# Patient Record
Sex: Female | Born: 1980 | Race: Black or African American | Hispanic: No | Marital: Married | Smoking: Never smoker
Health system: Southern US, Community
[De-identification: ages and names within clinical notes are randomized; demographics above are authoritative.]

## PROBLEM LIST (undated history)

## (undated) ENCOUNTER — Inpatient Hospital Stay (HOSPITAL_COMMUNITY): Payer: Self-pay

## (undated) DIAGNOSIS — S0300XA Dislocation of jaw, unspecified side, initial encounter: Secondary | ICD-10-CM

## (undated) DIAGNOSIS — D649 Anemia, unspecified: Secondary | ICD-10-CM

## (undated) DIAGNOSIS — R030 Elevated blood-pressure reading, without diagnosis of hypertension: Secondary | ICD-10-CM

## (undated) DIAGNOSIS — G510 Bell's palsy: Secondary | ICD-10-CM

## (undated) DIAGNOSIS — Z789 Other specified health status: Secondary | ICD-10-CM

## (undated) DIAGNOSIS — K219 Gastro-esophageal reflux disease without esophagitis: Secondary | ICD-10-CM

## (undated) DIAGNOSIS — D259 Leiomyoma of uterus, unspecified: Secondary | ICD-10-CM

## (undated) DIAGNOSIS — Z8742 Personal history of other diseases of the female genital tract: Secondary | ICD-10-CM

## (undated) DIAGNOSIS — Z973 Presence of spectacles and contact lenses: Secondary | ICD-10-CM

## (undated) DIAGNOSIS — R52 Pain, unspecified: Secondary | ICD-10-CM

## (undated) DIAGNOSIS — R011 Cardiac murmur, unspecified: Secondary | ICD-10-CM

## (undated) DIAGNOSIS — J309 Allergic rhinitis, unspecified: Secondary | ICD-10-CM

## (undated) HISTORY — DX: Dislocation of jaw, unspecified side, initial encounter: S03.00XA

## (undated) HISTORY — DX: Cardiac murmur, unspecified: R01.1

## (undated) HISTORY — DX: Gastro-esophageal reflux disease without esophagitis: K21.9

---
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
## 2000-06-20 ENCOUNTER — Emergency Department (HOSPITAL_COMMUNITY): Admission: EM | Admit: 2000-06-20 | Discharge: 2000-06-20 | Payer: Self-pay | Admitting: Emergency Medicine

## 2000-07-16 ENCOUNTER — Encounter: Admission: RE | Admit: 2000-07-16 | Discharge: 2000-07-16 | Payer: Self-pay | Admitting: Family Medicine

## 2000-09-30 ENCOUNTER — Ambulatory Visit (HOSPITAL_COMMUNITY): Admission: RE | Admit: 2000-09-30 | Discharge: 2000-09-30 | Payer: Self-pay | Admitting: Sports Medicine

## 2000-09-30 ENCOUNTER — Encounter: Admission: RE | Admit: 2000-09-30 | Discharge: 2000-09-30 | Payer: Self-pay | Admitting: Family Medicine

## 2001-07-07 ENCOUNTER — Encounter: Admission: RE | Admit: 2001-07-07 | Discharge: 2001-07-07 | Payer: Self-pay | Admitting: Family Medicine

## 2002-07-16 ENCOUNTER — Encounter (INDEPENDENT_AMBULATORY_CARE_PROVIDER_SITE_OTHER): Payer: Self-pay

## 2002-07-16 ENCOUNTER — Encounter: Admission: RE | Admit: 2002-07-16 | Discharge: 2002-07-16 | Payer: Self-pay | Admitting: Family Medicine

## 2002-08-09 ENCOUNTER — Encounter: Admission: RE | Admit: 2002-08-09 | Discharge: 2002-08-09 | Payer: Self-pay | Admitting: Family Medicine

## 2002-09-16 ENCOUNTER — Encounter: Admission: RE | Admit: 2002-09-16 | Discharge: 2002-09-16 | Payer: Self-pay | Admitting: Sports Medicine

## 2002-10-09 ENCOUNTER — Inpatient Hospital Stay (HOSPITAL_COMMUNITY): Admission: AD | Admit: 2002-10-09 | Discharge: 2002-10-09 | Payer: Self-pay | Admitting: Family Medicine

## 2002-10-18 ENCOUNTER — Encounter: Admission: RE | Admit: 2002-10-18 | Discharge: 2002-10-18 | Payer: Self-pay | Admitting: Family Medicine

## 2002-10-21 ENCOUNTER — Ambulatory Visit (HOSPITAL_COMMUNITY): Admission: RE | Admit: 2002-10-21 | Discharge: 2002-10-21 | Payer: Self-pay | Admitting: Family Medicine

## 2002-11-22 ENCOUNTER — Inpatient Hospital Stay (HOSPITAL_COMMUNITY): Admission: AD | Admit: 2002-11-22 | Discharge: 2002-11-22 | Payer: Self-pay | Admitting: Obstetrics and Gynecology

## 2002-11-23 ENCOUNTER — Encounter: Admission: RE | Admit: 2002-11-23 | Discharge: 2002-11-23 | Payer: Self-pay | Admitting: Family Medicine

## 2002-12-24 ENCOUNTER — Inpatient Hospital Stay (HOSPITAL_COMMUNITY): Admission: AD | Admit: 2002-12-24 | Discharge: 2002-12-25 | Payer: Self-pay | Admitting: Obstetrics and Gynecology

## 2002-12-30 ENCOUNTER — Encounter (INDEPENDENT_AMBULATORY_CARE_PROVIDER_SITE_OTHER): Payer: Self-pay | Admitting: Specialist

## 2002-12-30 ENCOUNTER — Encounter: Admission: RE | Admit: 2002-12-30 | Discharge: 2002-12-30 | Payer: Self-pay | Admitting: Family Medicine

## 2003-01-03 ENCOUNTER — Encounter: Admission: RE | Admit: 2003-01-03 | Discharge: 2003-01-03 | Payer: Self-pay | Admitting: Family Medicine

## 2003-01-13 ENCOUNTER — Encounter: Admission: RE | Admit: 2003-01-13 | Discharge: 2003-01-13 | Payer: Self-pay | Admitting: Family Medicine

## 2003-01-27 ENCOUNTER — Inpatient Hospital Stay (HOSPITAL_COMMUNITY): Admission: AD | Admit: 2003-01-27 | Discharge: 2003-01-27 | Payer: Self-pay | Admitting: Obstetrics & Gynecology

## 2003-01-28 ENCOUNTER — Encounter: Admission: RE | Admit: 2003-01-28 | Discharge: 2003-01-28 | Payer: Self-pay | Admitting: Family Medicine

## 2003-02-10 ENCOUNTER — Encounter: Admission: RE | Admit: 2003-02-10 | Discharge: 2003-02-10 | Payer: Self-pay | Admitting: Family Medicine

## 2003-02-17 ENCOUNTER — Encounter: Admission: RE | Admit: 2003-02-17 | Discharge: 2003-02-17 | Payer: Self-pay | Admitting: Family Medicine

## 2003-02-18 ENCOUNTER — Ambulatory Visit (HOSPITAL_COMMUNITY): Admission: RE | Admit: 2003-02-18 | Discharge: 2003-02-18 | Payer: Self-pay | Admitting: Family Medicine

## 2003-02-24 ENCOUNTER — Inpatient Hospital Stay (HOSPITAL_COMMUNITY): Admission: AD | Admit: 2003-02-24 | Discharge: 2003-02-24 | Payer: Self-pay | Admitting: *Deleted

## 2003-02-24 ENCOUNTER — Encounter: Admission: RE | Admit: 2003-02-24 | Discharge: 2003-02-24 | Payer: Self-pay | Admitting: Family Medicine

## 2003-02-24 ENCOUNTER — Encounter: Payer: Self-pay | Admitting: *Deleted

## 2003-02-25 ENCOUNTER — Inpatient Hospital Stay (HOSPITAL_COMMUNITY): Admission: AD | Admit: 2003-02-25 | Discharge: 2003-02-25 | Payer: Self-pay | Admitting: Family Medicine

## 2003-03-01 ENCOUNTER — Inpatient Hospital Stay (HOSPITAL_COMMUNITY): Admission: AD | Admit: 2003-03-01 | Discharge: 2003-03-01 | Payer: Self-pay | Admitting: *Deleted

## 2003-03-03 ENCOUNTER — Encounter: Admission: RE | Admit: 2003-03-03 | Discharge: 2003-03-03 | Payer: Self-pay | Admitting: Sports Medicine

## 2003-03-07 ENCOUNTER — Inpatient Hospital Stay (HOSPITAL_COMMUNITY): Admission: AD | Admit: 2003-03-07 | Discharge: 2003-03-07 | Payer: Self-pay | Admitting: Family Medicine

## 2003-03-10 ENCOUNTER — Encounter: Admission: RE | Admit: 2003-03-10 | Discharge: 2003-03-10 | Payer: Self-pay | Admitting: Family Medicine

## 2003-03-17 ENCOUNTER — Encounter: Admission: RE | Admit: 2003-03-17 | Discharge: 2003-03-17 | Payer: Self-pay | Admitting: Sports Medicine

## 2003-03-18 ENCOUNTER — Encounter: Admission: RE | Admit: 2003-03-18 | Discharge: 2003-03-18 | Payer: Self-pay | Admitting: *Deleted

## 2003-03-22 ENCOUNTER — Encounter: Admission: RE | Admit: 2003-03-22 | Discharge: 2003-03-22 | Payer: Self-pay | Admitting: Family Medicine

## 2003-03-22 ENCOUNTER — Encounter: Admission: RE | Admit: 2003-03-22 | Discharge: 2003-03-22 | Payer: Self-pay | Admitting: *Deleted

## 2003-03-23 ENCOUNTER — Inpatient Hospital Stay (HOSPITAL_COMMUNITY): Admission: AD | Admit: 2003-03-23 | Discharge: 2003-03-25 | Payer: Self-pay | Admitting: *Deleted

## 2003-03-29 ENCOUNTER — Encounter: Admission: RE | Admit: 2003-03-29 | Discharge: 2003-03-29 | Payer: Self-pay | Admitting: Sports Medicine

## 2003-04-26 ENCOUNTER — Encounter: Admission: RE | Admit: 2003-04-26 | Discharge: 2003-04-26 | Payer: Self-pay | Admitting: Family Medicine

## 2003-05-03 ENCOUNTER — Encounter: Admission: RE | Admit: 2003-05-03 | Discharge: 2003-05-03 | Payer: Self-pay | Admitting: Family Medicine

## 2003-05-03 ENCOUNTER — Other Ambulatory Visit: Admission: RE | Admit: 2003-05-03 | Discharge: 2003-05-03 | Payer: Self-pay | Admitting: Family Medicine

## 2003-05-03 ENCOUNTER — Encounter (INDEPENDENT_AMBULATORY_CARE_PROVIDER_SITE_OTHER): Payer: Self-pay | Admitting: Specialist

## 2003-12-16 ENCOUNTER — Encounter: Admission: RE | Admit: 2003-12-16 | Discharge: 2003-12-16 | Payer: Self-pay | Admitting: Family Medicine

## 2003-12-26 ENCOUNTER — Encounter (INDEPENDENT_AMBULATORY_CARE_PROVIDER_SITE_OTHER): Payer: Self-pay | Admitting: *Deleted

## 2003-12-26 ENCOUNTER — Encounter: Admission: RE | Admit: 2003-12-26 | Discharge: 2003-12-26 | Payer: Self-pay | Admitting: Family Medicine

## 2004-04-09 ENCOUNTER — Ambulatory Visit: Payer: Self-pay | Admitting: Sports Medicine

## 2004-05-10 ENCOUNTER — Ambulatory Visit: Payer: Self-pay | Admitting: Family Medicine

## 2004-08-27 ENCOUNTER — Ambulatory Visit: Payer: Self-pay | Admitting: Family Medicine

## 2004-12-10 ENCOUNTER — Ambulatory Visit: Payer: Self-pay | Admitting: Sports Medicine

## 2005-02-22 ENCOUNTER — Ambulatory Visit: Payer: Self-pay | Admitting: Family Medicine

## 2005-03-08 ENCOUNTER — Encounter (INDEPENDENT_AMBULATORY_CARE_PROVIDER_SITE_OTHER): Payer: Self-pay | Admitting: *Deleted

## 2005-03-08 LAB — CONVERTED CEMR LAB

## 2005-03-15 ENCOUNTER — Ambulatory Visit: Payer: Self-pay | Admitting: Family Medicine

## 2005-03-19 ENCOUNTER — Ambulatory Visit: Payer: Self-pay | Admitting: Family Medicine

## 2005-04-01 ENCOUNTER — Ambulatory Visit: Payer: Self-pay | Admitting: Sports Medicine

## 2005-09-06 ENCOUNTER — Ambulatory Visit: Payer: Self-pay | Admitting: Family Medicine

## 2006-01-09 ENCOUNTER — Encounter (INDEPENDENT_AMBULATORY_CARE_PROVIDER_SITE_OTHER): Payer: Self-pay | Admitting: Specialist

## 2006-01-09 ENCOUNTER — Ambulatory Visit: Payer: Self-pay | Admitting: Family Medicine

## 2006-04-09 ENCOUNTER — Ambulatory Visit: Payer: Self-pay | Admitting: Family Medicine

## 2006-07-31 DIAGNOSIS — J309 Allergic rhinitis, unspecified: Secondary | ICD-10-CM | POA: Insufficient documentation

## 2006-08-01 ENCOUNTER — Encounter (INDEPENDENT_AMBULATORY_CARE_PROVIDER_SITE_OTHER): Payer: Self-pay | Admitting: *Deleted

## 2006-09-01 ENCOUNTER — Telehealth: Payer: Self-pay | Admitting: *Deleted

## 2006-09-22 ENCOUNTER — Ambulatory Visit: Payer: Self-pay | Admitting: Sports Medicine

## 2006-10-23 ENCOUNTER — Telehealth (INDEPENDENT_AMBULATORY_CARE_PROVIDER_SITE_OTHER): Payer: Self-pay | Admitting: Family Medicine

## 2006-11-17 ENCOUNTER — Telehealth (INDEPENDENT_AMBULATORY_CARE_PROVIDER_SITE_OTHER): Payer: Self-pay | Admitting: Family Medicine

## 2006-12-11 ENCOUNTER — Ambulatory Visit: Payer: Self-pay | Admitting: Sports Medicine

## 2007-03-11 ENCOUNTER — Encounter (INDEPENDENT_AMBULATORY_CARE_PROVIDER_SITE_OTHER): Payer: Self-pay | Admitting: Family Medicine

## 2007-03-11 ENCOUNTER — Ambulatory Visit: Payer: Self-pay | Admitting: Family Medicine

## 2007-03-11 LAB — CONVERTED CEMR LAB: GC Probe Amp, Genital: NEGATIVE

## 2007-03-16 ENCOUNTER — Encounter (INDEPENDENT_AMBULATORY_CARE_PROVIDER_SITE_OTHER): Payer: Self-pay | Admitting: Family Medicine

## 2007-05-21 ENCOUNTER — Ambulatory Visit: Payer: Self-pay | Admitting: Family Medicine

## 2007-06-03 ENCOUNTER — Telehealth: Payer: Self-pay | Admitting: Family Medicine

## 2007-06-04 ENCOUNTER — Emergency Department (HOSPITAL_COMMUNITY): Admission: EM | Admit: 2007-06-04 | Discharge: 2007-06-04 | Payer: Self-pay | Admitting: Emergency Medicine

## 2007-06-04 HISTORY — PX: DIAGNOSTIC LAPAROSCOPY: SUR761

## 2007-07-22 ENCOUNTER — Ambulatory Visit: Payer: Self-pay | Admitting: Family Medicine

## 2007-07-22 ENCOUNTER — Telehealth: Payer: Self-pay | Admitting: *Deleted

## 2007-09-25 ENCOUNTER — Emergency Department (HOSPITAL_COMMUNITY): Admission: EM | Admit: 2007-09-25 | Discharge: 2007-09-25 | Payer: Self-pay | Admitting: Emergency Medicine

## 2007-09-25 ENCOUNTER — Telehealth: Payer: Self-pay | Admitting: *Deleted

## 2007-10-14 ENCOUNTER — Encounter (INDEPENDENT_AMBULATORY_CARE_PROVIDER_SITE_OTHER): Payer: Self-pay | Admitting: Family Medicine

## 2007-10-14 ENCOUNTER — Ambulatory Visit: Payer: Self-pay | Admitting: Family Medicine

## 2007-10-14 LAB — CONVERTED CEMR LAB
Chlamydia, DNA Probe: NEGATIVE
GC Probe Amp, Genital: NEGATIVE

## 2007-10-16 ENCOUNTER — Ambulatory Visit: Payer: Self-pay | Admitting: Family Medicine

## 2007-10-16 ENCOUNTER — Encounter (INDEPENDENT_AMBULATORY_CARE_PROVIDER_SITE_OTHER): Payer: Self-pay | Admitting: Family Medicine

## 2007-10-19 ENCOUNTER — Encounter (INDEPENDENT_AMBULATORY_CARE_PROVIDER_SITE_OTHER): Payer: Self-pay | Admitting: Family Medicine

## 2007-10-19 ENCOUNTER — Telehealth: Payer: Self-pay | Admitting: *Deleted

## 2007-11-24 ENCOUNTER — Encounter: Payer: Self-pay | Admitting: *Deleted

## 2007-11-25 ENCOUNTER — Telehealth: Payer: Self-pay | Admitting: *Deleted

## 2007-11-26 ENCOUNTER — Telehealth: Payer: Self-pay | Admitting: *Deleted

## 2007-11-26 ENCOUNTER — Ambulatory Visit: Payer: Self-pay | Admitting: *Deleted

## 2007-11-30 ENCOUNTER — Encounter (INDEPENDENT_AMBULATORY_CARE_PROVIDER_SITE_OTHER): Payer: Self-pay | Admitting: Family Medicine

## 2008-03-14 ENCOUNTER — Ambulatory Visit: Payer: Self-pay | Admitting: Family Medicine

## 2008-03-14 ENCOUNTER — Encounter: Payer: Self-pay | Admitting: Family Medicine

## 2008-03-14 LAB — CONVERTED CEMR LAB
Bilirubin Urine: NEGATIVE
Glucose, Urine, Semiquant: NEGATIVE
Urobilinogen, UA: 0.2

## 2008-03-15 LAB — CONVERTED CEMR LAB: Varicella IgG: 4.64 — ABNORMAL HIGH

## 2008-04-18 ENCOUNTER — Ambulatory Visit: Payer: Self-pay | Admitting: Family Medicine

## 2008-05-13 ENCOUNTER — Ambulatory Visit: Payer: Self-pay | Admitting: Family Medicine

## 2008-05-13 ENCOUNTER — Encounter (INDEPENDENT_AMBULATORY_CARE_PROVIDER_SITE_OTHER): Payer: Self-pay | Admitting: Family Medicine

## 2008-05-18 ENCOUNTER — Encounter: Payer: Self-pay | Admitting: Family Medicine

## 2008-06-30 ENCOUNTER — Telehealth: Payer: Self-pay | Admitting: Family Medicine

## 2008-07-05 ENCOUNTER — Ambulatory Visit: Payer: Self-pay | Admitting: Family Medicine

## 2008-07-15 ENCOUNTER — Encounter: Payer: Self-pay | Admitting: Family Medicine

## 2008-07-20 ENCOUNTER — Ambulatory Visit: Payer: Self-pay | Admitting: Family Medicine

## 2008-07-20 ENCOUNTER — Encounter: Payer: Self-pay | Admitting: Family Medicine

## 2008-07-20 DIAGNOSIS — F322 Major depressive disorder, single episode, severe without psychotic features: Secondary | ICD-10-CM | POA: Insufficient documentation

## 2008-07-25 ENCOUNTER — Telehealth: Payer: Self-pay | Admitting: Family Medicine

## 2008-07-25 ENCOUNTER — Encounter: Payer: Self-pay | Admitting: Family Medicine

## 2008-07-26 ENCOUNTER — Encounter: Payer: Self-pay | Admitting: Family Medicine

## 2008-08-12 ENCOUNTER — Telehealth: Payer: Self-pay | Admitting: Family Medicine

## 2008-08-15 ENCOUNTER — Ambulatory Visit: Payer: Self-pay | Admitting: Family Medicine

## 2008-08-15 ENCOUNTER — Encounter: Payer: Self-pay | Admitting: Family Medicine

## 2008-09-04 ENCOUNTER — Encounter: Payer: Self-pay | Admitting: Family Medicine

## 2008-09-07 ENCOUNTER — Telehealth: Payer: Self-pay | Admitting: Family Medicine

## 2008-09-21 ENCOUNTER — Telehealth: Payer: Self-pay | Admitting: Family Medicine

## 2008-09-28 ENCOUNTER — Telehealth: Payer: Self-pay | Admitting: Family Medicine

## 2008-11-03 ENCOUNTER — Ambulatory Visit: Payer: Self-pay | Admitting: Family Medicine

## 2008-11-03 ENCOUNTER — Encounter: Payer: Self-pay | Admitting: Family Medicine

## 2008-11-03 LAB — CONVERTED CEMR LAB
Antibody Screen: NEGATIVE
Eosinophils Absolute: 0.1 10*3/uL (ref 0.0–0.7)
Eosinophils Relative: 1 % (ref 0–5)
HCT: 36.1 % (ref 36.0–46.0)
Hemoglobin: 12.6 g/dL (ref 12.0–15.0)
Lymphs Abs: 2.3 10*3/uL (ref 0.7–4.0)
MCV: 86 fL (ref 78.0–100.0)
Monocytes Absolute: 0.6 10*3/uL (ref 0.1–1.0)
Monocytes Relative: 6 % (ref 3–12)
Platelets: 230 10*3/uL (ref 150–400)
RBC: 4.2 M/uL (ref 3.87–5.11)
Rh Type: NEGATIVE
Rubella: 121.1 intl units/mL — ABNORMAL HIGH
Sickle Cell Screen: NEGATIVE
WBC: 9.6 10*3/uL (ref 4.0–10.5)

## 2008-11-04 ENCOUNTER — Encounter: Payer: Self-pay | Admitting: Family Medicine

## 2008-11-07 ENCOUNTER — Encounter: Payer: Self-pay | Admitting: *Deleted

## 2008-11-12 ENCOUNTER — Telehealth: Payer: Self-pay | Admitting: Family Medicine

## 2008-12-01 ENCOUNTER — Encounter: Payer: Self-pay | Admitting: Family Medicine

## 2008-12-01 ENCOUNTER — Ambulatory Visit: Payer: Self-pay | Admitting: Family Medicine

## 2008-12-01 DIAGNOSIS — E669 Obesity, unspecified: Secondary | ICD-10-CM

## 2008-12-02 ENCOUNTER — Telehealth: Payer: Self-pay | Admitting: *Deleted

## 2008-12-08 ENCOUNTER — Ambulatory Visit: Payer: Self-pay | Admitting: Family Medicine

## 2008-12-08 ENCOUNTER — Encounter: Payer: Self-pay | Admitting: Family Medicine

## 2008-12-14 ENCOUNTER — Encounter (INDEPENDENT_AMBULATORY_CARE_PROVIDER_SITE_OTHER): Payer: Self-pay | Admitting: Family Medicine

## 2008-12-30 ENCOUNTER — Encounter: Payer: Self-pay | Admitting: Family Medicine

## 2008-12-30 ENCOUNTER — Ambulatory Visit: Payer: Self-pay | Admitting: Family Medicine

## 2009-01-02 ENCOUNTER — Ambulatory Visit: Payer: Self-pay | Admitting: Family Medicine

## 2009-01-02 ENCOUNTER — Telehealth: Payer: Self-pay | Admitting: Family Medicine

## 2009-01-30 ENCOUNTER — Telehealth: Payer: Self-pay | Admitting: Family Medicine

## 2009-01-30 ENCOUNTER — Inpatient Hospital Stay (HOSPITAL_COMMUNITY): Admission: AD | Admit: 2009-01-30 | Discharge: 2009-01-30 | Payer: Self-pay | Admitting: Obstetrics & Gynecology

## 2009-01-31 ENCOUNTER — Encounter: Payer: Self-pay | Admitting: Family Medicine

## 2009-01-31 ENCOUNTER — Ambulatory Visit: Payer: Self-pay | Admitting: Family Medicine

## 2009-02-01 ENCOUNTER — Encounter: Payer: Self-pay | Admitting: Family Medicine

## 2009-02-01 ENCOUNTER — Ambulatory Visit (HOSPITAL_COMMUNITY): Admission: RE | Admit: 2009-02-01 | Discharge: 2009-02-01 | Payer: Self-pay | Admitting: Family Medicine

## 2009-03-02 ENCOUNTER — Ambulatory Visit: Payer: Self-pay | Admitting: Family Medicine

## 2009-03-17 ENCOUNTER — Telehealth: Payer: Self-pay | Admitting: *Deleted

## 2009-03-20 ENCOUNTER — Telehealth: Payer: Self-pay | Admitting: *Deleted

## 2009-04-06 ENCOUNTER — Encounter: Payer: Self-pay | Admitting: Family Medicine

## 2009-04-06 ENCOUNTER — Ambulatory Visit: Payer: Self-pay | Admitting: Family Medicine

## 2009-04-06 LAB — CONVERTED CEMR LAB
HCT: 38.6 % (ref 36.0–46.0)
Hemoglobin: 12.6 g/dL (ref 12.0–15.0)
MCHC: 32.6 g/dL (ref 30.0–36.0)
RBC: 4.11 M/uL (ref 3.87–5.11)
RDW: 14.7 % (ref 11.5–15.5)

## 2009-04-24 ENCOUNTER — Encounter: Payer: Self-pay | Admitting: Family Medicine

## 2009-04-24 ENCOUNTER — Ambulatory Visit: Payer: Self-pay | Admitting: Family Medicine

## 2009-04-26 ENCOUNTER — Ambulatory Visit: Payer: Self-pay | Admitting: Family Medicine

## 2009-05-02 ENCOUNTER — Ambulatory Visit: Payer: Self-pay | Admitting: Family Medicine

## 2009-05-02 ENCOUNTER — Encounter: Payer: Self-pay | Admitting: Family Medicine

## 2009-05-15 ENCOUNTER — Telehealth: Payer: Self-pay | Admitting: Family Medicine

## 2009-05-17 ENCOUNTER — Encounter: Payer: Self-pay | Admitting: Family Medicine

## 2009-05-18 ENCOUNTER — Encounter: Payer: Self-pay | Admitting: Family Medicine

## 2009-05-18 ENCOUNTER — Ambulatory Visit: Payer: Self-pay | Admitting: Family Medicine

## 2009-05-18 LAB — CONVERTED CEMR LAB: Protein, U semiquant: NEGATIVE

## 2009-05-23 ENCOUNTER — Inpatient Hospital Stay (HOSPITAL_COMMUNITY): Admission: AD | Admit: 2009-05-23 | Discharge: 2009-05-23 | Payer: Self-pay | Admitting: Family Medicine

## 2009-05-25 ENCOUNTER — Telehealth: Payer: Self-pay | Admitting: Family Medicine

## 2009-05-31 ENCOUNTER — Ambulatory Visit: Payer: Self-pay | Admitting: Family Medicine

## 2009-05-31 ENCOUNTER — Encounter: Payer: Self-pay | Admitting: Family Medicine

## 2009-06-09 ENCOUNTER — Encounter: Payer: Self-pay | Admitting: Family Medicine

## 2009-06-09 ENCOUNTER — Ambulatory Visit: Payer: Self-pay | Admitting: Family Medicine

## 2009-06-09 LAB — CONVERTED CEMR LAB
Chlamydia, DNA Probe: NEGATIVE
GC Probe Amp, Genital: NEGATIVE

## 2009-06-11 ENCOUNTER — Ambulatory Visit: Payer: Self-pay | Admitting: Obstetrics and Gynecology

## 2009-06-11 ENCOUNTER — Inpatient Hospital Stay (HOSPITAL_COMMUNITY): Admission: AD | Admit: 2009-06-11 | Discharge: 2009-06-11 | Payer: Self-pay | Admitting: Obstetrics & Gynecology

## 2009-06-14 ENCOUNTER — Encounter: Payer: Self-pay | Admitting: Family Medicine

## 2009-06-14 ENCOUNTER — Ambulatory Visit: Payer: Self-pay | Admitting: Family Medicine

## 2009-06-14 LAB — CONVERTED CEMR LAB
ALT: 11 units/L (ref 0–35)
AST: 18 units/L (ref 0–37)
CO2: 19 meq/L (ref 19–32)
Chloride: 105 meq/L (ref 96–112)
Creatinine, Ser: 0.55 mg/dL (ref 0.40–1.20)
LDH: 167 units/L (ref 94–250)
MCV: 90.7 fL (ref 78.0–100.0)
Platelets: 186 10*3/uL (ref 150–400)
Protein, U semiquant: NEGATIVE
RDW: 14.4 % (ref 11.5–15.5)
Sodium: 137 meq/L (ref 135–145)
Total Bilirubin: 0.2 mg/dL — ABNORMAL LOW (ref 0.3–1.2)
Total Protein: 6.1 g/dL (ref 6.0–8.3)
WBC: 11.6 10*3/uL — ABNORMAL HIGH (ref 4.0–10.5)

## 2009-06-21 ENCOUNTER — Ambulatory Visit: Payer: Self-pay | Admitting: Family Medicine

## 2009-06-26 ENCOUNTER — Ambulatory Visit: Payer: Self-pay | Admitting: Family Medicine

## 2009-06-30 ENCOUNTER — Ambulatory Visit: Payer: Self-pay | Admitting: Family Medicine

## 2009-06-30 LAB — CONVERTED CEMR LAB
Cholesterol, target level: 200 mg/dL
LDL Goal: 160 mg/dL

## 2009-07-04 ENCOUNTER — Inpatient Hospital Stay (HOSPITAL_COMMUNITY): Admission: AD | Admit: 2009-07-04 | Discharge: 2009-07-05 | Payer: Self-pay | Admitting: Obstetrics and Gynecology

## 2009-07-05 ENCOUNTER — Ambulatory Visit: Payer: Self-pay | Admitting: Obstetrics & Gynecology

## 2009-07-06 ENCOUNTER — Ambulatory Visit: Payer: Self-pay | Admitting: Family Medicine

## 2009-07-07 ENCOUNTER — Ambulatory Visit: Payer: Self-pay | Admitting: Obstetrics & Gynecology

## 2009-07-10 ENCOUNTER — Ambulatory Visit: Payer: Self-pay | Admitting: Family Medicine

## 2009-07-10 ENCOUNTER — Inpatient Hospital Stay (HOSPITAL_COMMUNITY): Admission: RE | Admit: 2009-07-10 | Discharge: 2009-07-12 | Payer: Self-pay | Admitting: Obstetrics & Gynecology

## 2009-07-20 ENCOUNTER — Encounter: Payer: Self-pay | Admitting: Family Medicine

## 2009-07-20 ENCOUNTER — Telehealth: Payer: Self-pay | Admitting: Family Medicine

## 2009-07-20 ENCOUNTER — Ambulatory Visit: Payer: Self-pay | Admitting: Family Medicine

## 2009-07-20 DIAGNOSIS — I10 Essential (primary) hypertension: Secondary | ICD-10-CM

## 2009-07-20 LAB — CONVERTED CEMR LAB
ALT: 37 units/L — ABNORMAL HIGH (ref 0–35)
AST: 21 units/L (ref 0–37)
Albumin: 4 g/dL (ref 3.5–5.2)
CO2: 20 meq/L (ref 19–32)
Calcium: 9.5 mg/dL (ref 8.4–10.5)
Chloride: 106 meq/L (ref 96–112)
Creatinine, Ser: 0.79 mg/dL (ref 0.40–1.20)
Glucose, Urine, Semiquant: NEGATIVE
HCT: 40.8 % (ref 36.0–46.0)
Platelets: 334 10*3/uL (ref 150–400)
Potassium: 4 meq/L (ref 3.5–5.3)
Protein, U semiquant: NEGATIVE
RDW: 13.9 % (ref 11.5–15.5)
Sodium: 140 meq/L (ref 135–145)
Total Protein: 6.7 g/dL (ref 6.0–8.3)
WBC: 9.4 10*3/uL (ref 4.0–10.5)

## 2009-07-21 ENCOUNTER — Encounter: Payer: Self-pay | Admitting: Family Medicine

## 2009-07-21 ENCOUNTER — Telehealth: Payer: Self-pay | Admitting: *Deleted

## 2009-07-21 LAB — CONVERTED CEMR LAB: Protein, Ur: 96 mg/24hr (ref 50–100)

## 2009-08-02 ENCOUNTER — Telehealth: Payer: Self-pay | Admitting: Family Medicine

## 2009-08-04 ENCOUNTER — Ambulatory Visit: Payer: Self-pay | Admitting: Family Medicine

## 2009-08-04 ENCOUNTER — Encounter: Payer: Self-pay | Admitting: Family Medicine

## 2009-08-22 ENCOUNTER — Ambulatory Visit: Payer: Self-pay | Admitting: Family Medicine

## 2009-08-28 ENCOUNTER — Ambulatory Visit: Payer: Self-pay | Admitting: Family Medicine

## 2009-09-05 ENCOUNTER — Ambulatory Visit: Payer: Self-pay | Admitting: Family Medicine

## 2009-09-07 ENCOUNTER — Ambulatory Visit: Payer: Self-pay | Admitting: Family Medicine

## 2009-09-07 ENCOUNTER — Encounter: Payer: Self-pay | Admitting: *Deleted

## 2009-09-11 ENCOUNTER — Ambulatory Visit: Payer: Self-pay | Admitting: Family Medicine

## 2009-09-19 ENCOUNTER — Emergency Department (HOSPITAL_COMMUNITY): Admission: EM | Admit: 2009-09-19 | Discharge: 2009-09-20 | Payer: Self-pay | Admitting: Emergency Medicine

## 2009-09-20 ENCOUNTER — Emergency Department (HOSPITAL_COMMUNITY): Admission: EM | Admit: 2009-09-20 | Discharge: 2009-09-20 | Payer: Self-pay | Admitting: Emergency Medicine

## 2009-09-25 ENCOUNTER — Telehealth: Payer: Self-pay | Admitting: Family Medicine

## 2009-10-31 ENCOUNTER — Ambulatory Visit: Payer: Self-pay | Admitting: Family Medicine

## 2009-11-09 ENCOUNTER — Encounter: Payer: Self-pay | Admitting: Family Medicine

## 2009-11-13 ENCOUNTER — Telehealth: Payer: Self-pay | Admitting: Family Medicine

## 2009-11-13 ENCOUNTER — Emergency Department (HOSPITAL_COMMUNITY): Admission: EM | Admit: 2009-11-13 | Discharge: 2009-11-13 | Payer: Self-pay | Admitting: Emergency Medicine

## 2009-11-13 ENCOUNTER — Encounter: Admission: RE | Admit: 2009-11-13 | Discharge: 2009-11-13 | Payer: Self-pay | Admitting: Family Medicine

## 2009-11-30 ENCOUNTER — Encounter: Payer: Self-pay | Admitting: *Deleted

## 2009-12-07 ENCOUNTER — Telehealth: Payer: Self-pay | Admitting: *Deleted

## 2009-12-27 ENCOUNTER — Telehealth: Payer: Self-pay | Admitting: *Deleted

## 2009-12-28 ENCOUNTER — Telehealth: Payer: Self-pay | Admitting: Family Medicine

## 2010-01-04 ENCOUNTER — Ambulatory Visit: Payer: Self-pay | Admitting: Family Medicine

## 2010-01-04 LAB — CONVERTED CEMR LAB: Rhuematoid fact SerPl-aCnc: <20 intl units/mL (ref 0–20)

## 2010-01-08 ENCOUNTER — Encounter: Payer: Self-pay | Admitting: Family Medicine

## 2010-01-08 ENCOUNTER — Telehealth: Payer: Self-pay | Admitting: Family Medicine

## 2010-01-18 ENCOUNTER — Ambulatory Visit: Payer: Self-pay | Admitting: Family Medicine

## 2010-01-26 ENCOUNTER — Encounter: Payer: Self-pay | Admitting: Family Medicine

## 2010-02-17 ENCOUNTER — Emergency Department (HOSPITAL_COMMUNITY): Admission: EM | Admit: 2010-02-17 | Discharge: 2010-02-17 | Payer: Self-pay | Admitting: Emergency Medicine

## 2010-02-23 ENCOUNTER — Telehealth: Payer: Self-pay | Admitting: Family Medicine

## 2010-02-28 ENCOUNTER — Ambulatory Visit: Payer: Self-pay | Admitting: Family Medicine

## 2010-02-28 DIAGNOSIS — R002 Palpitations: Secondary | ICD-10-CM | POA: Insufficient documentation

## 2010-02-28 LAB — CONVERTED CEMR LAB
Pap Smear: NEGATIVE
Whiff Test: NEGATIVE

## 2010-03-07 ENCOUNTER — Encounter: Payer: Self-pay | Admitting: Family Medicine

## 2010-03-07 ENCOUNTER — Telehealth: Payer: Self-pay | Admitting: Family Medicine

## 2010-03-20 ENCOUNTER — Telehealth: Payer: Self-pay | Admitting: Family Medicine

## 2010-03-20 ENCOUNTER — Emergency Department (HOSPITAL_BASED_OUTPATIENT_CLINIC_OR_DEPARTMENT_OTHER): Admission: EM | Admit: 2010-03-20 | Discharge: 2010-03-20 | Payer: Self-pay | Admitting: Emergency Medicine

## 2010-03-21 ENCOUNTER — Telehealth: Payer: Self-pay | Admitting: Sports Medicine

## 2010-03-26 ENCOUNTER — Ambulatory Visit: Payer: Self-pay | Admitting: Family Medicine

## 2010-03-26 DIAGNOSIS — R109 Unspecified abdominal pain: Secondary | ICD-10-CM | POA: Insufficient documentation

## 2010-03-26 LAB — CONVERTED CEMR LAB
Beta hcg, urine, semiquantitative: NEGATIVE
Bilirubin Urine: NEGATIVE
Ketones, urine, test strip: NEGATIVE
Specific Gravity, Urine: 1.025
Urobilinogen, UA: 0.2

## 2010-04-06 ENCOUNTER — Encounter: Payer: Self-pay | Admitting: Family Medicine

## 2010-04-09 ENCOUNTER — Telehealth (INDEPENDENT_AMBULATORY_CARE_PROVIDER_SITE_OTHER): Payer: Self-pay | Admitting: *Deleted

## 2010-04-24 ENCOUNTER — Ambulatory Visit: Payer: Self-pay | Admitting: Family Medicine

## 2010-04-24 DIAGNOSIS — N644 Mastodynia: Secondary | ICD-10-CM

## 2010-04-25 ENCOUNTER — Encounter: Admission: RE | Admit: 2010-04-25 | Discharge: 2010-04-25 | Payer: Self-pay | Admitting: Family Medicine

## 2010-07-03 NOTE — Assessment & Plan Note (Signed)
Summary: PNV 36+4 wks  Prenatal Visit      This is a 30 years old female G4, P3, T3, PT0, LC3, Ab0 who is in for a prenatal visit.  Since the last visit, she notes that she is doing well but has some concerns.  Concerns noted: occasional uncomfortable contractions. every ten minutes or so. no leakage of fluid, vaginal bleeding. good fetal movement. ***FIRST CALL FOR DELIVERY- DR. Angelena Sole, PAGER [phone]*** ***SECOND CALL- DR. Clementeen Graham, PAGER [phone]*** ***THIRD CALL- Lequita Asal, PAGER [phone]***  Vital Signs:  Patient profile:   30 year old female Weight:      187.1 pounds Temp:     98.1 degrees F oral Pulse rate:   109 / minute Pulse rhythm:   regular BP sitting:   115 / 74 Cuff size:   large  Vitals Entered By: Loralee Pacas CMA (June 09, 2009 10:07 AM)   Prenatal Visit      This is a 30 years old female G4, P3, T3, PT0, LC3, Ab0 who is in for a prenatal visit.  Since the last visit, she notes that she is doing well but has some concerns.  Concerns noted: occasional uncomfortable contractions. every ten minutes or so. no leakage of fluid, vaginal bleeding. good fetal movement.     Flowsheet View for Follow-up Visit    Estimated weeks of       gestation:     36 4/7    Weight:     187.1    Blood pressure:   115 / 74    Headache:     No    Nausea/vomiting:   No    Edema:     TrLE    Vaginal bleeding:   no    Vaginal discharge:   d/c    Fundal height:      37.5    FHR:       140s    Fetal activity:     yes    Labor symptoms:   yes    Fetal position:     vertex    Cx Dilation:     1    Cx Effacement:   50%    Cx Station:     -3    Taking prenatal vits?   Y    Smoking:     n/a    Next visit:     1 wk    Resident:     Lanier Prude    Preceptor:     Mauricio Po  Physical Exam  General:  Well-developed,well-nourished,in no acute distress; alert,appropriate and cooperative throughout examination Mouth:  MMM Abdomen:  gravid. size appropriate for dates. fetus is  vertex.    Impression & Recommendations:  Problem # 1:  PREGNANCY, MULTIGRAVIDA (ICD-V22.1) Assessment Unchanged ***FIRST CALL- DR. Angelena Sole, PAGER [phone]*** ***SECOND CALL- DR. Clementeen Graham, PAGER [phone]*** ***THIRD CALL- Lequita Asal, PAGER [phone]***  30 y/o [phone] at 36+4 wks. doing well. not in active labor. labor precautions given. GBS, GC/Chl performed today.   -female infant, desires circumcision while in hospital -IUD postpartum -3 hr GTT 80, 127,125, 95 -HIV, RPR, GC/Chl negative -HBsaG negative -Rubella immune -Sickle cell screen negative -BLOOD TYPE B NEGATIVE *** RECEIVED RHOGAM AT 28 WEEKS***   Orders: Grp B Probe-[facility] (10272-53664) GC/Chlamydia-[facility] (87591/87491) Other OB visit- [facility] ([facility])  Problem # 2:  RH FACTOR, NEGATIVE (ICD-656.10) Assessment: Unchanged received rhogam at 28 weeks. will need injection postpartum  Complete Medication List: 1)  Cetirizine Hcl  10 Mg Tabs (Cetirizine hcl) .Marland Kitchen.. 1 by mouth once daily for allergies 2)  Prenatal 19 Tabs (Prenatal vit-dss-fe fum-fa) .... Take 1 prenatal vitamin daily during pregnancy. 3)  Ranitidine Hcl 150 Mg Tabs (Ranitidine hcl) .Marland Kitchen.. 1-2 by mouth once daily   Patient Instructions: 1)  Schedule follow up with Dr. Lelon Perla in 1 week for OB visit, then with Dr. Lanier Prude in 2 weeks, and then with Dr. Lelon Perla in 3 weeks.  2)  If you have any: leaking of fluid, vaginal discharge, vaginal bleeding, decreased movement of the baby, severe abdominal pain, and/or painful regular contractions, you should call our office or go straight to Maternity Admissions at [facility] 3)  Take your prenatal vitamin daily   Appended Document: PNV 36+4 wks faxed.

## 2010-07-03 NOTE — Assessment & Plan Note (Signed)
Summary: breast pain,df   Vital Signs:  Patient profile:   30 year old female Height:      62.5 inches Weight:      161 pounds BMI:     29.08 BSA:     1.75 Temp:     98.1 degrees F Pulse rate:   90 / minute BP sitting:   135 / 90  Vitals Entered By: Jone Baseman CMA (Oct 31, 2009 3:09 PM) CC: breast pain Is Patient Diabetic? No Pain Assessment Patient in pain? yes     Location: breast Intensity: 2   Primary Care Provider:  Ancil Boozer  MD  CC:  breast pain.  History of Present Illness: breast pain: has been having some problems intermittently since birth of her last child 07/2009.  thought in the past to be multifactorial (hormonal, also lifting heavy infant car seat).  was rx'd ibuprofen which helped.  since then she has been hypervigilant in monitoring her breasts.  she was seen approx 1.5 mo ago for pain in right breast and possible knot.  it was thought to be fibrocystic changes. patient was reassurred. this knot has since gone away but she continues to have intermittent pain in the breast.  since then within the past 2 weeks (just since her LMP 2 wks ago) she has had increasing pain in the left breast and thinks she has found a knot at about the 10 o'clock position which she should like evaluated.  she's had no fever.  no redness or warmth to the breast.  no nipple discharge.  no skin changes over nodule.   Habits & Providers  Alcohol-Tobacco-Diet     Tobacco Status: never  Current Medications (verified): 1)  Naproxen 500 Mg Tabs (Naproxen) .Marland Kitchen.. 1 By Mouth Two Times A Day As Needed Pain. 2)  Sprintec 28 0.25-35 Mg-Mcg Tabs (Norgestimate-Eth Estradiol) .... Take 1 Tab By Mouth Daily Dispo: 1 Pack  Allergies (verified): No Known Drug Allergies  Family History: brother - 26 healthy, father - died age 52 with leukemia,  gma - dm,  mother - htn aunt with htn all children are heatlhy  no 1st degree relatives with breast problems  Social History: Lives with  2 daughters Lowanda Foster [id number], brianica b1999)and sons Monico Blitz, [place] U0454). No tob/etoh/drugs.  Church-goer.  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  Works at [employer] [place].  recently married (11/2008) to father of children Jiselle Sheu  Review of Systems       per HPI  Physical Exam  General:  alert and well-developed.   VS  noted.  borderline HTN Breasts:  skin/areolae normal.  fibrocystic changes at inferior borders of breasts bilaterally that is tender.  fibrocystic changes at 10 oclock position with no definitive nodule where patient is feeling nodule.  no nipple discharge.  no axillary LAD   Impression & Recommendations:  Problem # 1:  ? of LUMP OR MASS IN BREAST (ICD-611.72) Assessment New given  that we do feel fibrocystic changes at least in area where patient is feeling abnormlity will get confirmatory ultrasound of area to be sure that this isn't more to worry about.   i have reassurred this patient that i think this is benign fam history reviewed and she is at low risk she is at low risk also due to age, multiparity.   Orders: Ultrasound (Ultrasound) [facility]- Est Level  3 (09811)  Complete Medication List: 1)  Naproxen 500 Mg Tabs (Naproxen) .Marland Kitchen.. 1 by mouth  two times a day as needed pain. 2)  Sprintec 28 0.25-35 Mg-mcg Tabs (Norgestimate-eth estradiol) .... Take 1 tab by mouth daily dispo: 1 pack  Patient Instructions: 1)  We will schedule the mammogram to be sure 2)  Please watch your blood pressure at home and if it stays elevated let us know.

## 2010-07-03 NOTE — Assessment & Plan Note (Signed)
Summary: ? yeast infection,tcb   Vital Signs:  Patient profile:   30 year old female Height:      62.5 inches Weight:      158 pounds BMI:     28.54 BSA:     1.74 Temp:     98.3 degrees F Pulse rate:   84 / minute BP sitting:   130 / 79  Vitals Entered By: Jone Baseman CMA (January 04, 2010 4:01 PM) CC: ? Yeast infection, Vaginal discharge, Lower Extremity Joint pain Is Patient Diabetic? No Pain Assessment Patient in pain? no        Primary Care Provider:  Ancil Boozer  MD  CC:  ? Yeast infection, Vaginal discharge, and Lower Extremity Joint pain.  History of Present Illness:  Vaginal discharge      This is a 30 year old woman who presents with Vaginal discharge.  The patient complains of itching, vaginal burning, and pelvic pain, but denies burning on urination, frequency, urgency, and fever.  The discharge is described as yellow.  Associated symptoms include painful intercourse and arthralgias.  The patient denies the following symptoms: genital sores and rash.  Prior treatment has included no prior treatment.    Right Sided Extremity Joint Pain      The patient also presents with Right Sided Extremity Joint pain.  The patient complains of weakness, but denies burning on urination, frequency, urgency, and fever.  The pain is located in the right hip, right knee, and right ankle and the right shoulder, the right wrist and the right elbow.  The pain began gradually and with no injury.  The pain is described as burning and intermittent.  The patient denies the following symptoms: fever, rash, diarrhea, and dysuria.  She has been complaining of this for 6 months.  No previous w/u.  No family h/o RA or SLE.  Better with NSAIDS.  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 1 By Mouth Two Times A Day As Needed Pain. 2)  Sprintec 28 0.25-35 Mg-Mcg Tabs (Norgestimate-Eth Estradiol) .... Take 1 Tab By Mouth Daily Dispo: 1 Pack 3)  Flexeril 10 Mg Tabs (Cyclobenzaprine Hcl) .Marland Kitchen.. 1 By Mouth Three Times A Day As Needed  Allergies (verified): No Known Drug Allergies PMH-FH-SH reviewed-no changes except otherwise noted  Review of Systems  The patient denies anorexia, fever, weight loss, weight gain, hoarseness, chest pain, syncope, dyspnea on exertion, peripheral edema, prolonged cough, headaches, and abdominal pain.    Physical Exam  General:  alert, well-developed, and well-nourished.  alert, well-developed, and well-nourished.   Head:  normocephalic and atraumatic.  normocephalic and atraumatic.   Neck:  supple.  supple.   Lungs:  normal respiratory effort and normal breath sounds.  normal respiratory effort and normal breath sounds.   Heart:  normal rate, regular rhythm, and no murmur.  normal rate, regular rhythm, and no murmur.   Abdomen:  soft, non-tender, and no distention.  soft, non-tender, and no distention.   Genitalia:  normal introitus, no external lesions, no vaginal or  cervical lesions, No CMT, normal uterus size and position, no adnexal masses or tenderness, and vaginal discharge.  normal introitus, no external lesions, no vaginal or cervical lesions, normal uterus size and position, no adnexal masses or tenderness, and vaginal discharge.   Msk:  normal ROM, no joint swelling, no joint warmth, no redness over joints, no joint deformities, and joint tenderness.  normal ROM, no joint swelling, no joint warmth, no redness over joints, and no joint deformities.     Impression & Recommendations:  Problem # 1:   VAGINITIS, CANDIDAL (ICD-112.1)  Orders: Wet Prep- [facility] (16109) [facility]- Est Level  3 (60454)  Her updated medication list for this problem includes:    Fluconazole 150 Mg Tabs (Fluconazole) .Marland Kitchen... 1 by mouth daily x 1, repeat in 24 hours  Problem # 2:  BACK PAIN (ICD-724.5) Start heat, NSAIDS and Flexeril Her updated medication list for this problem includes:    Naproxen 500 Mg Tabs (Naproxen) .Marland Kitchen... 1 by mouth two times a day as needed pain.    Flexeril 10 Mg Tabs (Cyclobenzaprine hcl) .Marland Kitchen... 1 by mouth three times a day as needed  Orders: [facility]- Est Level  3 (09811)  Problem # 3:  PAIN IN JOINT OTHER SPECIFIED SITES (ICD-719.48)  Orders: [facility]- Est Level  3 (91478) ANA-[facility] (29562-13086) Rheum Fact-[facility] (57846) CRP, high sensitivity-[facility] (96295-28413)  Complete Medication List: 1)  Naproxen 500 Mg Tabs (Naproxen) .Marland Kitchen.. 1 by mouth two times a day as needed pain. 2)  Sprintec 28 0.25-35 Mg-mcg Tabs (Norgestimate-eth estradiol) .... Take 1 tab by mouth daily dispo: 1 pack 3)  Flexeril 10 Mg Tabs (Cyclobenzaprine hcl) .Marland Kitchen.. 1 by mouth three times a day as needed 4)  Fluconazole 150 Mg Tabs (Fluconazole) .Marland Kitchen.. 1 by mouth daily x 1, repeat in 24 hours  Patient Instructions: 1)  Please schedule a follow-up appointment in 1 month-6 wks.  Prescriptions: FLUCONAZOLE 150 MG TABS (FLUCONAZOLE) 1 by mouth daily x 1, repeat in 24 hours  #2 x 2   Entered and Authorized by:   Tinnie Gens MD   Signed by:   Tinnie Gens MD on 01/04/2010   Method used:   Electronically to        [facility]  [address] [id number] (ret[facility])       [address]       [place], [place]  [address]       Ph: [phone] or [phone]       Fax: [phone]   RxID:   [id number] NAPROXEN 500 MG TABS (NAPROXEN) 1 by mouth two times a day as needed pain.  #60 x 3   Entered and Authorized by:   Tinnie Gens MD   Signed by:   Tinnie Gens MD on 01/04/2010   Method used:   Electronically to        [facility]  [address]  [id number] ([facility])       [address]       [place], [place]  [address]       Ph: [phone] or [phone]       Fax: [phone]   RxID:   [id number] FLEXERIL 10 MG TABS (CYCLOBENZAPRINE HCL) 1 by mouth three times a day as needed  #60 x 3   Entered and Authorized by:   Tinnie Gens MD   Signed by:   Tinnie Gens MD on 01/04/2010   Method used:   [facility]ronically  to        [facility]  [address] [id number] ([facility]       [address]       [place], [place]  [address]       Ph: [phone] or [phone]       Fax: [phone]   RxID:   [id number]   Laboratory Results  Date/Time Received: January 04, 2010 4:34 PM  Date/Time Reported: January 04, 2010 5:00 PM   Wet [place] Source: vag WBC/hpf: loaded Bacteria/hpf: 3+  Rods Clue cells/hpf: none  Negative whiff Yeast/hpf: many Trichomonas/hpf: none Comments: ...............test performed by......Marland KitchenBonnie A. Swaz[place], MLS (ASCP)cm

## 2010-07-03 NOTE — Assessment & Plan Note (Signed)
Summary: PNV 38+2 wks     Allergies: No Known Drug Allergies  Vital Signs:  Patient profile:   30 year old female Weight:      188.7 pounds Temp:     98.3 degrees F oral Pulse rate:   112 / minute Pulse rhythm:   regular BP sitting:   127 / 76  (left arm) Cuff size:   regular  Vitals Entered By: Loralee Pacas CMA (June 21, 2009 4:17 PM)   Social History: Packs/Day:  n/a   Prenatal Visit      This is a 30 years old female G4, P3, T3, PT0, LC3, Ab0 who is in for a prenatal visit.  Since the last visit, she notes that she is doing well but has some concerns.  Concerns noted: not sleeping. recent death in family. waking up several times a night, and not just due to discomfort or having to urinate. having contractions at least every 10 minutes that she is having to breath and walk through, but no leaking fluid, bleeding, or other concerns.    Flowsheet View for Follow-up Visit    Estimated weeks of       gestation:     38 2/7    Weight:     188.7    Blood pressure:   127 / 76    Headache:     No    Nausea/vomiting:   No    Edema:     TrLE    Vaginal bleeding:   no    Vaginal discharge:   no    Fundal height:      39    FHR:       110s    Fetal activity:     yes    Labor symptoms:   yes    Fetal position:     vertex    Cx Dilation:     2    Cx Effacement:   60%    Cx Station:     -3    Taking prenatal vits?   Y    Smoking:     n/a    Next visit:     1 wk    Resident:     Lanier Prude    Preceptor:     Mauricio Po   Physical Exam  General:  gravid. uncomfortable appearing. breathing through contractions.  Abdomen:  Gravid c/w dates   Impression & Recommendations:  Problem # 1:  PREGNANCY, MULTIGRAVIDA (ICD-V22.1) Assessment Unchanged  30 y/o G4P3003 at 38+2 wks. doing well. not in active labor. labor precautions given.   -female infant, desires circumcision while in hospital -IUD postpartum -3 hr GTT 80, 127,125, 95 -HIV, RPR, GC/Chl negative -HBsaG  negative -Rubella immune -Sickle cell screen negative -BLOOD TYPE B NEGATIVE *** RECEIVED RHOGAM AT 28 WEEKS*** -GBS negative  Orders: Other OB visit- [facility] ([facility]K)  Complete Medication List: 1)  Cetirizine Hcl 10 Mg Tabs (Cetirizine hcl) .Marland Kitchen.. 1 by mouth once daily for allergies 2)  Prenatal 19 Tabs (Prenatal vit-dss-fe fum-fa) .... Take 1 prenatal vitamin daily during pregnancy. 3)  Ranitidine Hcl 150 Mg Tabs (Ranitidine hcl) .Marland Kitchen.. 1-2 by mouth once daily 4)  Zolpidem Tartrate 5 Mg Tabs (Zolpidem tartrate) .... One half to one full tab by mouth at bedtime as needed for trouble sleeping

## 2010-07-03 NOTE — Progress Notes (Signed)
  Phone Note Call from Patient   Caller: Patient Call For: [phone] Summary of Call: Please call patient back.  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 states she usually has abnormal paps after giving birth & she gets a pap.  tp to see if another needed now since she just had a son.  c/o pain in L breast  has been taking ibu. has appt wed with pcp. did not want to come in sooner. to discuss pap with md Follow-up by: Golden Circle RN,  February 23, 2010 4:29 PM

## 2010-07-03 NOTE — Progress Notes (Signed)
Summary: Sinus Pressure  Phone Note Call from Patient Call back at Home Phone [phone]   Caller: Patient Summary of Call: Patient called complaining of bilateral frontal pressure and postnasal drip.  She believes it is sinus pressure and wants to take an OTC preparation she has at home, but wants to know if it is safe to take because she was recently diagnosed with HTN and is now taking medicine for that.  She just checked her BP at home and it was 121/81.  The OTC contains Diphenhydramine, Acetaminophen, and Phenylephrine.  I advised that the Diphenhydramine and Acetaminophen are safe to take, bu that she should avoid Phenylephrine and all other decongestants given her HTN.  Advised Acetaminophen for pain control, Ibuprofen also acceptable but warned that prolonged use not recommended with HTN.  Given her symptoms and the time of year, also advised trial of Zyrtec OTC in case this is allergy-related, but should call for appointment if persists. Initial call taken by: Romero Belling MD,  September 25, 2009 11:10 PM  Follow-up for Phone Call        agree, thanks Follow-up by: Ancil Boozer  MD,  September 26, 2009 8:32 AM

## 2010-07-03 NOTE — Miscellaneous (Signed)
Summary: elevated bp  Clinical Lists Changes here to deliver 24hr urine. bp sitting R arm is 167/101 p.80. had just taken her new med (HCTZ) 2 hours ago. states she feels fine other than being tired.  discussed salt in diet & reading labels. encouraged more fresh vegtables. gave her red flags. told her we have md on call when we are closed. drink plenty of water . she agreed with plan.Golden Circle RN  July 21, 2009 3:24 PM

## 2010-07-03 NOTE — Assessment & Plan Note (Signed)
Summary: TB TEST/EO  Nurse Visit   Allergies: No Known Drug Allergies  Immunizations Administered:  PPD Skin Test:    Vaccine Type: PPD    Site: right forearm    Mfr: Sanofi Pasteur    Dose: 0.1 ml    Route: ID    Given by: Theresia Lo RN    Exp. Date: 03/16/2011    Lot #: [id number]  Orders Added: 1)  TB Skin Test [86580] 2)  Admin 1st Vaccine [phone]

## 2010-07-03 NOTE — Assessment & Plan Note (Signed)
Summary: read ppd/kh  Nurse Visit   Allergies: No Known Drug Allergies  PPD Results    Date of reading: 09/07/2009    Results: < 5mm    Interpretation: negative

## 2010-07-03 NOTE — Progress Notes (Signed)
Summary: phn msg  Phone Note Call from Patient Call back at Home Phone [phone]   Caller: Patient Summary of Call: was here in Sept - was still having breast pain-  she is still having the pain and wants to know if she can go get [place] without having to come in here 1st?  Initial call taken by: De Nurse,  April 09, 2010 11:21 AM  Follow-up for Phone Call        Please have pt come back for an office visit to discuss Follow-up by: Angelena Sole MD,  April 11, 2010 11:17 AM  Additional Follow-up for Phone Call Additional follow up Details #1::        LMOVM informing patient. Additional Follow-up by: Jone Baseman CMA,  April 12, 2010 9:31 AM  New Problems: BREAST PAIN (ICD-611.71)   New Problems: BREAST PAIN (ICD-611.71)

## 2010-07-03 NOTE — Assessment & Plan Note (Signed)
Summary: 6 wk pp,df   Vital Signs:  Patient profile:   30 year old female Height:      62.5 inches Weight:      166 pounds BMI:     29.99 Temp:     98.4 degrees F oral Pulse rate:   95 / minute BP sitting:   138 / 81  (left arm) Cuff size:   regular  Vitals Entered By: Garen Grams LPN (August 22, 2009 2:37 PM) CC: postpartum visit Is Patient Diabetic? No Pain Assessment Patient in pain? yes     Location: rt shoulder   Primary Care Provider:  Ancil Boozer  MD  CC:  postpartum visit.  History of Present Illness: 6 week postpartum visit  Concerns 1. Right arm swelling:  Pt thinks that she has had right arm swelling for the past week.  It has not gotten any better or worse.  Thinks that she feels some swollen glands under her right arm pit.  Expresses concern about swelling in her right arm and right flank.  There is no warm or red areas      ROS: denies fevers, breast pain or nodules, decreased range of motion of the shoulder  Contraception:  Thinking about IUD vs BTL.  Discussed with patient the different pros and cons.  Mood:  Good, no depressive symptoms  Habits & Providers  Alcohol-Tobacco-Diet     Tobacco Status: never  Current Medications (verified): 1)  Cetirizine Hcl 10 Mg Tabs (Cetirizine Hcl) .Marland Kitchen.. 1 By Mouth Once Daily For Allergies 2)  Ranitidine Hcl 150 Mg Tabs (Ranitidine Hcl) .Marland Kitchen.. 1-2 By Mouth Once Daily 3)  Zolpidem Tartrate 5 Mg Tabs (Zolpidem Tartrate) .... One Half To One Full Tab By Mouth At Bedtime As Needed For Trouble Sleeping 4)  Hydrochlorothiazide 25 Mg Tabs (Hydrochlorothiazide) .... Take 1 Tab By Mouth Daily 5)  Ibuprofen 600 Mg Tabs (Ibuprofen) .... Take 1 Tab By Mouth Every 6 Hours For Pain and Swelling 6)  Sprintec 28 0.25-35 Mg-Mcg Tabs (Norgestimate-Eth Estradiol) .... Take 1 Tab By Mouth Daily Dispo: 1 Pack  Allergies: No Known Drug Allergies  Past History:  Past Medical History: Reviewed history from 07/20/2009 and no changes  required. [id number] hx abnormal pap with normal colpo 1998 hx gonorrhea 1998 tx depression off and on   Social History: Reviewed history from 07/20/2009 and no changes required. Lives with 2 daughters (brittany, brianica)and sons ([place], Ashley). No tob/etoh/drugs.  Church-goer.  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  Works at [employer] [place].  recently married (11/2008) to father of children Summerlynn Glauser  Physical Exam  General:  alert and well-developed.   Head:  normocephalic and atraumatic.   Eyes:  vision grossly intact.   Ears:  no external deformities.   Mouth:  MMM, good dentition.   Neck:  supple and full ROM.   Lungs:  normal respiratory effort, no accessory muscle use, normal breath sounds, and no crackles.   Heart:  RRR,  no murmur.   Abdomen:  soft, non-tender, normal bowel sounds, and no distention.  Msk:  Right arm/shoulder:  no areas of skin breakdown or cellulitis.  No definite swelling compared to other arm.  TTP along entire upper arm, shoulder, and axilla.  Small lymph nodes palpated in axilla but none prominent.  Full ROM. No upper quadrant breast masses. Pulses:  bilateral radial pulses present Extremities:  no lower extremity edema Neurologic:  alert & oriented X3.   Skin:  turgor normal and color normal.   Psych:  not anxious appearing.     Impression & Recommendations:  Problem # 1:  POSTPARTUM EXAMINATION (ICD-V24.2) Assessment Unchanged  Planning IUD vs. BTL for contraception will discuss in 1 week.  Provided Sprintec until then.  No signs of depressed mood.  Adjusting well.  Orders: Postpartum visit- [facility] [phone])  Problem # 2:  LOCALIZED SUPERFICIAL SWELLING MASS OR LUMP (ICD-782.2) Assessment: New  Unsure of cause of right arm, shoulder pain.  Possibly lymphadenitis, msk, thrombophelbitis.  No definite sign of swelling, redness, or warmth.  Will treat with warm compresses, NSAIDs.  If not better in 4-6 weeks may consider  ultrasound to look for clots or x-ray to look for shoulder pathology.  Orders: Postpartum visit- [facility] [phone])  Problem # 3:  VAGINAL DISCHARGE (ICD-623.5) Assessment: Improved  [id number]  Complete Medication List: 1)  Cetirizine Hcl 10 Mg Tabs (Cetirizine hcl) .Marland Kitchen.. 1 by mouth once daily for allergies 2)  Ranitidine Hcl 150 Mg Tabs (Ranitidine hcl) .Marland Kitchen.. 1-2 by mouth once daily 3)  Zolpidem Tartrate 5 Mg Tabs (Zolpidem tartrate) .... One half to one full tab by mouth at bedtime as needed for trouble sleeping 4)  Hydrochlorothiazide 25 Mg Tabs (Hydrochlorothiazide) .... Take 1 tab by mouth daily 5)  Ibuprofen 600 Mg Tabs (Ibuprofen) .... Take 1 tab by mouth every 6 hours for pain and swelling 6)  Sprintec 28 0.25-35 Mg-mcg Tabs (Norgestimate-eth estradiol) .... Take 1 tab by mouth daily dispo: 1 pack  Other Orders: TB Skin Test [phone]) Admin 1st Vaccine (30865) Admin 1st Vaccine [facility]) [phone])  Patient Instructions: 1)  It was great seeing you again today 2)  I can tell that your right arm is worrisome to you.  I do not think that it is anything serious.  Continue with the warm compresses.  I will also prescribe an anti-inflammatory for you to take.   3)  If not better in 4 weeks than please be seen again.  4)  If it becomes more swollen, red, or tender be seen sooner 5)  I have printed out some information on long term contraceptive methods.  I think that you need to discuss the options with your husband.  I have provided a birth control pill in the meantime. 6)  Please schedule a follow up appointment with me in 1 week to discuss what you have decided. Prescriptions: SPRINTEC 28 0.25-35 MG-MCG TABS (NORGESTIMATE-ETH ESTRADIOL) Take 1 tab by mouth daily Dispo: 1 pack  #1 x 3   Entered and Authorized by:   Angelena Sole MD   Signed by:   Angelena Sole MD on 08/22/2009   Method used:   Electronically to        [facility]  [address] [id number] (retail)        [address]       [place], [place]  [address]       Ph: [phone] or [phone]       Fax: [phone]   RxID:   [id number] IBUPROFEN 600 MG TABS (IBUPROFEN) Take 1 tab by mouth every 6 hours for pain and swelling  #40 x 0   Entered and Authorized by:   Angelena Sole MD   Signed by:   Angelena Sole MD on 08/22/2009   Method used:   Electronically to        [facility]  [address] [phone][facility] (retail)  [address]       [place], [place]  [address]       Ph: [phone] or [phone]       Fax: [phone]   RxID:   [id number]    PPD Application    Vaccine Type: PPD    Site: left forearm    Mfr: Sanofi Pasteur    Dose: 0.1 ml    Route: ID    Given by: Garen Grams LPN    Exp. Date: 03/16/2011    Lot #: [id number]

## 2010-07-03 NOTE — Assessment & Plan Note (Signed)
Summary: ppd,df  Nurse Visit patient did not return to have PPD read by 08/25/2009. she came in today. she states on 08/25/2009 she did not notice any sign at  the area where PPD was given. however on 07/27/2009 she did notice  raised area. area is now raised 5 mm. she does admit to rubbing area from time to time and examining frequently . Dr. [place] consulted.  will plan to repeat PPD on 04/05/20011 as in 2 step procedure.  Theresia Lo RN  August 28, 2009 11:15 AM   Allergies: No Known Drug Allergies  Orders Added: 1)  No Charge Patient Arrived (NCPA0) [NCPA0]

## 2010-07-03 NOTE — Letter (Signed)
Summary: TB Skin Test  All     ,     Phone:   Fax:           TB Skin Test    Jenna Mcclure    Date TB Test Placed:  ________________  L or R forearm  TB Test Placed by:  ___________________  Lot #:  __________________        Expiration Date: _____________  Date TB Test Read:  ____________________    Result ___________MM  TB Test Read by:  _______________

## 2010-07-03 NOTE — Assessment & Plan Note (Signed)
Summary: PNV 40+3 wks   Vital Signs:  Patient profile:   31 year old female Height:      62 inches Weight:      193.0 pounds BMI:     35.43 Temp:     98.5 degrees F oral Pulse rate:   102 / minute BP sitting:   136 / 75  (right arm) Cuff size:   regular  Vitals Entered By: Gladstone Pih (July 06, 2009 10:24 AM) CC: OB  40-3 Is Patient Diabetic? No Pain Assessment Patient in pain? no        CC:  OB  40-3.  Habits & Providers  Alcohol-Tobacco-Diet     Tobacco Status: never     Cigarette Packs/Day: n/a  Allergies: No Known Drug Allergies  Physical Exam  General:  Vitals reviewed.  Gravid. appears comfortable. not actively contracting.  Abdomen:  Gravid c/w dates   Impression & Recommendations:  Problem # 1:  PREGNANCY, MULTIGRAVIDA (ICD-V22.1) Assessment Unchanged  30 y/o G4P3003 at 40+3 wks. doing well. not in active labor. labor precautions given. induction on 2/7. NST tomorrow.    -female infant, desires circumcision while in hospital -IUD postpartum -3 hr GTT 80, 127,125, 95 -HIV, RPR, GC/Chl negative -HBsaG negative -Rubella immune -Sickle cell screen negative -BLOOD TYPE B NEGATIVE *** RECEIVED RHOGAM AT 28 WEEKS*** -GBS negative  Orders: Other OB visit- [facility] (OBCK)  Orders: Other OB visit- [facility] (OBCK)  Complete Medication List: 1)  Cetirizine Hcl 10 Mg Tabs (Cetirizine hcl) .Marland Kitchen.. 1 by mouth once daily for allergies 2)  Prenatal 19 Tabs (Prenatal vit-dss-fe fum-fa) .... Take 1 prenatal vitamin daily during pregnancy. 3)  Ranitidine Hcl 150 Mg Tabs (Ranitidine hcl) .Marland Kitchen.. 1-2 by mouth once daily 4)  Zolpidem Tartrate 5 Mg Tabs (Zolpidem tartrate) .... One half to one full tab by mouth at bedtime as needed for trouble sleeping  Prenatal Visit      This is a 30 years old female G4, P3, T3, PT0, LC3, Ab0 who is in for a prenatal visit.  Since the last visit, she notes that she is doing well and has no concerns.  Concerns noted: was hospitalized on  Tuesday for labor, but did not progress, so d/c'ed home. BPP normal yesterday. NST tomorrow. induction scheduled for 2/7   Flowsheet View for Follow-up Visit    Estimated weeks of       gestation:     40 3/7    Weight:     193.0    Blood pressure:   136 / 75    Headache:     No    Nausea/vomiting:   No    Edema:     TrLE    Vaginal bleeding:   no    Vaginal discharge:   no    Fundal height:      41    FHR:       140s    Fetal activity:     yes    Labor symptoms:   few ctx    Fetal position:     vertex    Taking prenatal vits?   Y    Smoking:     n/a    Resident:     Lanier Prude    Preceptor:     Donalee Citrin

## 2010-07-03 NOTE — Assessment & Plan Note (Signed)
Summary: high bp. she is post partum/[place]/alm   Vital Signs:  Patient profile:   30 year old female Temp:     98.6 degrees F Pulse rate:   80 / minute Pulse rhythm:   regular Resp:     18 per minute BP sitting:   168 / 108  Serial Vital Signs/Assessments:  Time      Position  BP       Pulse  Resp  Temp     By 2:00 PM             160/100                        Angelena Sole MD 2:15 PM             142/82                         Angelena Sole MD 2:15 PM             140/80                         Angelena Sole MD  Comments: 2:15 PM Left arm By: Angelena Sole MD  2:15 PM Right arm By: Angelena Sole MD    Primary Care Provider:  Ancil Boozer  MD   History of Present Illness: 1. High blood pressure: pt is a 30 yo G4P4004 who is PPD #10 after NSVD.  She was in her normal state of health when she was visited by a home health nurse.  She checked her blood pressure and it was 154/96 on right side and 148/100 on the left side.  She didn't have a headache today until she was told that she had to come into the clinic.  Doesn't check her blood pressure regularly.  She does have a hx of headaches and this one feels the same as her previous headaches.        ROS: she denies any vision changes, RUQ pain, vaginal discharge, chest pain.  Endorses vaginal               bleeding but it is improving       FamHx: HTN  Current Medications (verified): 1)  Cetirizine Hcl 10 Mg Tabs (Cetirizine Hcl) .Marland Kitchen.. 1 By Mouth Once Daily For Allergies 2)  Prenatal 19  Tabs (Prenatal Vit-Dss-Fe Fum-Fa) .... Take 1 Prenatal Vitamin Daily During Pregnancy. 3)  Ranitidine Hcl 150 Mg Tabs (Ranitidine Hcl) .Marland Kitchen.. 1-2 By Mouth Once Daily 4)  Zolpidem Tartrate 5 Mg Tabs (Zolpidem Tartrate) .... One Half To One Full Tab By Mouth At Bedtime As Needed For Trouble Sleeping 5)  Hydrochlorothiazide 25 Mg Tabs (Hydrochlorothiazide) .... Take 1 Tab By Mouth Daily  Allergies: No Known Drug Allergies  Past  History:  Past Medical History: G4P4004 hx abnormal pap with normal colpo 1998 hx gonorrhea 1998 tx depression off and on   Social History: Reviewed history from 11/03/2008 and no changes required. Lives with 2 daughters (brittany, brianica)and sons ([place], Louann). No tob/etoh/drugs.  Church-goer.  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  Works at [employer] [place].  recently married (11/2008) to father of children Alyha Marines  Physical Exam  General:  Vitals reviewed and rechecked.  Blood pressure improved. Well appearing, well-hydrated.  Head:  normocephalic and atraumatic.   Eyes:  vision grossly intact, pupils equal, pupils round,  and pupils reactive to light.  Fundoscopic exam benign Neck:  supple and no masses.   Lungs:  normal respiratory effort, no accessory muscle use, normal breath sounds, and no crackles.   Heart:  RRR,  with 2/6 SEM at bilateral upper sternal borders. no gallops Abdomen:  soft, non-tender, normal bowel sounds, and no distention.  FF below umbilicus. Extremities:  no lower extremity edema Neurologic:  Brisk reflexes Skin:  turgor normal and color normal.   Psych:  not anxious appearing.     Impression & Recommendations:  Problem # 1:  ESSENTIAL HYPERTENSION, BENIGN (ICD-401.1) Assessment New Concerning for preeclampsia in this postpartum patient.  She has had elevated blood pressure reading in the past but nothing this high.  Urine protein dipstick is negative which is reassuring.  Will still get other labs and have her collect 24 hour urine to make sure that she doesn't have PE.  Will start pt on HCTZ with close follow up. Her updated medication list for this problem includes:    Hydrochlorothiazide 25 Mg Tabs (Hydrochlorothiazide) .Marland Kitchen... Take 1 tab by mouth daily  Complete Medication List: 1)  Cetirizine Hcl 10 Mg Tabs (Cetirizine hcl) .Marland Kitchen.. 1 by mouth once daily for allergies 2)  Prenatal 19 Tabs (Prenatal vit-dss-fe fum-fa) .... Take  1 prenatal vitamin daily during pregnancy. 3)  Ranitidine Hcl 150 Mg Tabs (Ranitidine hcl) .Marland Kitchen.. 1-2 by mouth once daily 4)  Zolpidem Tartrate 5 Mg Tabs (Zolpidem tartrate) .... One half to one full tab by mouth at bedtime as needed for trouble sleeping 5)  Hydrochlorothiazide 25 Mg Tabs (Hydrochlorothiazide) .... Take 1 tab by mouth daily  Other Orders: UA Glucose/Protein-[facility] (81002) CBC-[facility] (63875) Comp Met-[facility] ([id number]) [facility]- Est  Level 4 (41660) Future Orders: 24hr. Urine TP- [facility] [phone]) ... 07/19/2010  Patient Instructions: 1)  The good news is that it doesn't look like you are preeclamptic 2)  I still would like to run a couple of tests just to make sure 3)  One of the tests is a 24 hour urine collection we will instruct you how to use that. 4)  I am also going to start you on a blood pressure medicine called HCTZ, please start taking it as directed. 5)  Please come back tomorrow to return the urine collection and schedule a nurse visit to recheck your blood pressure. Prescriptions: HYDROCHLOROTHIAZIDE 25 MG TABS (HYDROCHLOROTHIAZIDE) Take 1 tab by mouth daily  #30 x 3   Entered by:   Angelena Sole MD   Authorized by:   Marland Kitchen FPCDEFAULTPROVIDER   Signed by:   Angelena Sole MD on 07/20/2009   Method used:   Print then Give to Patient   RxID:   [id number]    Flowsheet View for Follow-up Visit    Estimated weeks of       gestation:     42 3/7    Blood pressure:   168 / 108    Urine protein:       negative    Urine glucose:    negative  Laboratory Results   Urine Tests  Date/Time Received: July 20, 2009 2:05 PM  Date/Time Reported: July 20, 2009 2:10 PM   Routine Urinalysis   Glucose: negative   (Normal Range: Negative) Protein: negative   (Normal Range: Negative)    Comments: .......test performed by........Marland Kitchen [place], SMA

## 2010-07-03 NOTE — Assessment & Plan Note (Signed)
Summary: ob per bolden/kh   Vital Signs:  Patient profile:   30 year old female Height:      62 inches Weight:      189 pounds BP sitting:   138 / 90  Vitals Entered By: Jone Baseman CMA (June 26, 2009 9:51 AM)  Serial Vital Signs/Assessments:  Time      Position  BP       Pulse  Resp  Temp     By 10:46 AM            125/75                         Angelena Sole MD   Primary Care Provider:  Ancil Boozer  MD   History of Present Illness: 1. Contractions: Pt is having some contractions.  About every 5 minutes.  They last 45 seconds.  Feel pretty strong.  Denies vaginal bleeding or leakage of fluid.  Habits & Providers  Alcohol-Tobacco-Diet     Cigarette Packs/Day: n/a  Current Medications (verified): 1)  Cetirizine Hcl 10 Mg Tabs (Cetirizine Hcl) .Marland Kitchen.. 1 By Mouth Once Daily For Allergies 2)  Prenatal 19  Tabs (Prenatal Vit-Dss-Fe Fum-Fa) .... Take 1 Prenatal Vitamin Daily During Pregnancy. 3)  Ranitidine Hcl 150 Mg Tabs (Ranitidine Hcl) .Marland Kitchen.. 1-2 By Mouth Once Daily 4)  Zolpidem Tartrate 5 Mg Tabs (Zolpidem Tartrate) .... One Half To One Full Tab By Mouth At Bedtime As Needed For Trouble Sleeping  Allergies: No Known Drug Allergies  Past History:  Past Medical History: Reviewed history from 12/01/2008 and no changes required. [id number], currently pregnant with 4th child hx abnormal pap with normal colpo 1998 hx gonorrhea 1998 tx depression off and on   Physical Exam  General:  Vitals reviewed.  Gravid. uncomfortable appearing. breathing through contractions.  Eyes:  vision grossly intact, pupils equal, pupils round, and pupils reactive to light.   Ears:  no external deformities.   Mouth:  MMM Lungs:  CTABL Heart:  RRR,  with 2/6 SEM at bilateral upper sternal borders. no gallops, peripheral pulses intact,  Abdomen:  Gravid c/w dates Extremities:  trace lower extremity edema   Impression & Recommendations:  Problem # 1:  PREGNANCY, MULTIGRAVIDA  (ICD-V22.1) Assessment Unchanged Pt is having some contractions but is not making any cervical change.  However since she is a multigravida will have her go to [facility] if her contractions continue for 1 hour. Orders: UA Glucose/Protein-[facility] (81002) Other OB visit- [facility] (OBCK)  Problem # 2:  ELEVATED BLOOD PRESSURE (ICD-796.2) Assessment: Unchanged Pts initial blood pressure elevated.  Recheck was much better 125/75.  Did PIH labs at prior OV.  UA at this visit is negative for protein.  If elevated again will send to [facility] monitoring. Orders: UA Glucose/Protein-[facility] (81002) Other OB visit- [facility] (OBCK)  Complete Medication List: 1)  Cetirizine Hcl 10 Mg Tabs (Cetirizine hcl) .Marland Kitchen.. 1 by mouth once daily for allergies 2)  Prenatal 19 Tabs (Prenatal vit-dss-fe fum-fa) .... Take 1 prenatal vitamin daily during pregnancy. 3)  Ranitidine Hcl 150 Mg Tabs (Ranitidine hcl) .Marland Kitchen.. 1-2 by mouth once daily 4)  Zolpidem Tartrate 5 Mg Tabs (Zolpidem tartrate) .... One half to one full tab by mouth at bedtime as needed for trouble sleeping  Patient Instructions: 1)  I can tell that the contractions are getting stronger 2)  You have not made any cervical change yet so you are not in labor 3)  If  the contractions continue for 1 more hour please go to the [facility] to have the recheck you. 4)  Schedule an appt in 3-4 days with Dr. Lanier Prude or Dr. Denyse Amass and in 1 week with myself.    Flowsheet View for Follow-up Visit    Estimated weeks of       gestation:     39 0/7    Weight:     189    Blood pressure:   138 / 90    Urine Protein:     negative    Urine Glucose:   negative    Headache:     No    Nausea/vomiting:   No    Edema:     TrLE    Vaginal bleeding:   no    Vaginal discharge:   no    Fundal height:      40    FHR:       140    Fetal activity:     yes    Labor symptoms:   yes    Fetal position:     vertex    Cx Dilation:     2    Cx Effacement:   60%    Cx Station:     -3    Taking  prenatal vits?   Y    Smoking:     n/a    Next visit:     1 wk    Resident:     Lelon Perla    Preceptor:     [place]   Laboratory Results   Urine Tests  Date/Time Received: June 26, 2009 9:58 AM  Date/Time Reported: June 26, 2009 10:06 AM   Routine Urinalysis   Glucose: negative   (Normal Range: Negative) Protein: negative   (Normal Range: Negative)    Comments: ...............test performed by......Marland KitchenBonnie A. Swazil[place], MLS (ASCP)cm

## 2010-07-03 NOTE — Letter (Signed)
Summary: Generic Letter  [facility]  [address]   [place], [place] [address]   Phone: [phone]  Fax: [phone]    03/07/2010  [facility] [address] [place], [place]  [address]  Dear Ms. Wix,  Your PAP smear was normal    Sincerely,   Angelena Sole MD  Appended Document: Generic Letter mailed.

## 2010-07-03 NOTE — Assessment & Plan Note (Signed)
Summary: breast pain,df   Vital Signs:  Patient profile:   30 year old female Height:      62.5 inches Weight:      149.19 pounds BMI:     26.95 BSA:     1.70 Temp:     98.5 degrees F Pulse rate:   80 / minute BP sitting:   119 / 84  Vitals Entered By: Jone Baseman CMA (April 24, 2010 3:16 PM) CC: left breast pain Is Patient Diabetic? No Pain Assessment Patient in pain? no        Primary Care Provider:  Angelena Sole MD  CC:  left breast pain.  History of Present Illness: 1. Left breast pain:  Pt presents with left breast pain since August.  The pain has been off and on.  Usually occurs about once every other day.  The pain is located in the left breast around the 12:00 to 1:00 position.  It seems to come and go randomly.  She has been taking Ibuprofen regularly which does seem to help the pain.  The pain does seem to be worse during her period.    ROS: denies breast mass, nipple discharge.  No shortness of breath, pleuritic chest pain.  Habits & Providers  Alcohol-Tobacco-Diet     Tobacco Status: never     Cigarette Packs/Day: n/a  Current Medications (verified): 1)  Flexeril 10 Mg Tabs (Cyclobenzaprine Hcl) .Marland Kitchen.. 1 By Mouth Three Times A Day As Needed 2)  Hydrochlorothiazide 12.5 Mg Caps (Hydrochlorothiazide) .Marland Kitchen.. 1 Tab By Mouth Daily 3)  Fluconazole 150 Mg Tabs (Fluconazole) .... Take 1 Tab By Mouth 4)  Ibuprofen 600 Mg Tabs (Ibuprofen) .Marland Kitchen.. 1 Tab By Mouth Every 6 Hours For Pain X 5 Days  Allergies: No Known Drug Allergies  Past History:  Past Medical History: Reviewed history from 11/09/2009 and no changes required. [id number] with first pregnancy in teenage years.  hx abnormal pap with normal colpo 1998 hx gonorrhea 1998 tx depression off and on  some somatization at times  Family History: Reviewed history from 10/31/2009 and no changes required. brother - 1 healthy, father - died age 58 with leukemia,  gma - dm,  mother - htn aunt with  htn all children are heatlhy  no 1st degree relatives with breast problems  Physical Exam  General:  vitals reviewed.  well appearing.  no acute distress Breasts:  Left breast:  normal breast tissue felt at 12:00 to 1:00 position (the site of her pain).  No masses.  Doesn't feel any different compared to the right.   Lungs:  normal respiratory effort, normal breath sounds, no crackles, and no wheezes.   Heart:  normal rate, regular rhythm, and no murmur.   Msk:  no rib pain or tenderness Axillary Nodes:  No palpable lymphadenopathy   Impression & Recommendations:  Problem # 1:  BREAST PAIN, LEFT (ICD-611.71) Assessment Unchanged Pain persists.  Seems to be confined to the breast tissue to where she is having pain.  No masses or abnormalities appreciated on exam.  Pt is concerned about it so will send for ultrasound to confirm. Orders: Ultrasound (Ultrasound) [facility]- Est Level  3 (32440)  Complete Medication List: 1)  Flexeril 10 Mg Tabs (Cyclobenzaprine hcl) .Marland Kitchen.. 1 by mouth three times a day as needed 2)  Hydrochlorothiazide 12.5 Mg Caps (Hydrochlorothiazide) .Marland Kitchen.. 1 tab by mouth daily 3)  Fluconazole 150 Mg Tabs (Fluconazole) .... Take 1 tab by mouth 4)  Ibuprofen 600 Mg Tabs (Ibuprofen) .Marland KitchenMarland KitchenMarland Kitchen  1 tab by mouth every 6 hours for pain x 5 days  Patient Instructions: 1)  I didn't feel anything concerning on exam 2)  I agree that we can get an ultrasound just to make sure 3)  Please schedule a follow up appointment after your ultrasound to discuss the findings   Orders Added: 1)  Ultrasound [Ultrasound] 2)  [facility]- Est Level  3 [16109]

## 2010-07-03 NOTE — Assessment & Plan Note (Signed)
Summary: f/u ed,df   Vital Signs:  Patient profile:   30 year old female Height:      62.5 inches Weight:      152.7 pounds BMI:     27.58 Temp:     98.7 degrees F oral Pulse rate:   105 / minute BP sitting:   112 / 74  (left arm) Cuff size:   regular  Vitals Entered By: Garen Grams LPN (February 28, 2010 3:29 PM) CC: Bradycardia, chest pain, vaginal discharge, UTI Is Patient Diabetic? No Pain Assessment Patient in pain? yes     Location: chest Intensity: 3   Primary Care Provider:  Angelena Sole MD  CC:  Bradycardia, chest pain, vaginal discharge, and UTI.  History of Present Illness: 1. Bradycardia:  Pt was seen in ED last week because she felt like her "heart was slowing down".  It only lasted a couple of seconds and then resolved but she was concerned.  In the ED they did an EKG which was normal and told her that it was likely palpitations.  She hasn't noticed that same feeling since then.  ROS: denies SS chest pain, shortness of breath, light-headedness, or dizziness  2. UTI:  She was also diagnosed with a UTI there.  She was having a little bit of pelvic and back pain.  She was treated with Macrobid.  The symptoms have now resolved.  ROS: denies dysuria, frequency, or burning  3. Atypical chest pain:  Has noticed some swelling and discomfort in the upper part of her left chest.  She thinks that it feels fuller at the most superior part of her breast but has noticed any discrete bumps or nodules.  The pain is worse when you push on the ribs.  She hasn't been taking anything for it.  Pain is not worse with exertion, not improved with rest.  Is constant.  ROS: denies shortness of breath, no pain in her shoulder or jaw.  4. Yeast infection:  She now thinks that she has a yeast infection from taking the antibiotic.  She has some vaginal itching and discharge  Habits & Providers  Alcohol-Tobacco-Diet     Tobacco Status: never     Cigarette Packs/Day: n/a  Current  Medications (verified): 1)  Flexeril 10 Mg Tabs (Cyclobenzaprine Hcl) .Marland Kitchen.. 1 By Mouth Three Times A Day As Needed 2)  Hydrochlorothiazide 12.5 Mg Caps (Hydrochlorothiazide) .Marland Kitchen.. 1 Tab By Mouth Daily 3)  Fluconazole 150 Mg Tabs (Fluconazole) .... Take 1 Tab By Mouth 4)  Ibuprofen 600 Mg Tabs (Ibuprofen) .Marland Kitchen.. 1 Tab By Mouth Every 6 Hours For Pain X 5 Days  Allergies: No Known Drug Allergies  Past History:  Past Medical History: Reviewed history from 11/09/2009 and no changes required. [id number] with first pregnancy in teenage years.  hx abnormal pap with normal colpo 1998 hx gonorrhea 1998 tx depression off and on  some somatization at times  Social History: Reviewed history from 11/09/2009 and no changes required. Lives with 2 daughters Lowanda Foster banks [phone], brianica banks b1999)and sons (jaylin Borthwick [phone], marquise Soltys [phone]). No tob/etoh/drugs.  Church-goer.  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  Works at [employer] [place].  recently married (11/2008) to father of children Ottie Neglia  Physical Exam  General:  vitals reviewed.  well appearing.  no acute distress Neck:  supple, full ROM, and no masses.   Chest Wall:  TTP along superior ribs (1-3) on the left.   Breasts:  normal breast tissue felt at  the superior edge of the breast.  Doesn't feel any different compared to the right.  No masses or nodules Lungs:  normal respiratory effort, normal breath sounds, no crackles, and no wheezes.   Heart:  normal rate, regular rhythm, and no murmur.   Genitalia:  Normal introitus for age, no external lesions, no vaginal discharge, mucosa pink and moist, no vaginal or cervical lesions, no vaginal atrophy, no friaility or hemorrhage, normal uterus size and position, no adnexal masses or tenderness Extremities:  no lower extremity edema Skin:  no redness or warmth Psych:  not anxious appearing Additional Exam:  EKG reviewed:  no ST-T wave changes.  Sinus tachycardia.  No PVCs   Impression &  Recommendations:  Problem # 1:  PALPITATIONS (ICD-785.1) Assessment New  No further episodes since her ED visit.  If they persist may need holter monitor or trial of BB  Orders: [facility]- Est  Level 4 (09811)  Problem # 2:  CHEST PAIN UNSPECIFIED (ICD-786.50) Assessment: Unchanged  Atypical chest pain.  Possibly costochondritis given that she is TTP across the ribs.  Will try Ibuprofen and see if that helps.  If she continues to be worried about fullness in the superior edge of the breat would consider getting ultrasound to rule out irregularity.  Orders: [facility]- Est  Level 4 (91478)  Problem # 3:  VAGINITIS, CANDIDAL (ICD-112.1) Assessment: New  Will get wet prep for evaluation.  Treat with Diflucan given high likelihood. Her updated medication list for this problem includes:    Fluconazole 150 Mg Tabs (Fluconazole) .Marland Kitchen... Take 1 tab by mouth  Orders: [facility]- Est  Level 4 (99214)  Complete Medication List: 1)  Flexeril 10 Mg Tabs (Cyclobenzaprine hcl) .Marland Kitchen.. 1 by mouth three times a day as needed 2)  Hydrochlorothiazide 12.5 Mg Caps (Hydrochlorothiazide) .Marland Kitchen.. 1 tab by mouth daily 3)  Fluconazole 150 Mg Tabs (Fluconazole) .... Take 1 tab by mouth 4)  Ibuprofen 600 Mg Tabs (Ibuprofen) .Marland Kitchen.. 1 tab by mouth every 6 hours for pain x 5 days  Other Orders: Pap [facility] ([id number]) [facility]  Patient Instructions: 1)  We will check you for a yeast infection.  I will also let you know your results of the PAP smear 2)  I have sent in an anti-fungal medication for you to take at home 3)  I think that your chest pain is from costochondritis, or inflammation of the ribs 4)  Try taking Ibuprofen 600 every 6 hours for the next 5 days 5)  Please schedule a follow up appointment in 4 weeks Prescriptions: IBUPROFEN 600 MG TABS (IBUPROFEN) 1 tab by mouth every 6 hours for pain x 5 days  #30 x 0   Entered and Authorized by:   Angelena Sole MD   Signed by:   Angelena Sole MD on  02/28/2010   Method used:   Electronically to        [facility]  [address] [id number] ([facility])       [address]       [place], [place]  [address]       Ph: [phone] or [phone]       Fax: [phone]   RxID:   [id number] FLUCONAZOLE 150 MG TABS (FLUCONAZOLE) Take 1 tab by mouth  #1 x 0   Entered and Authorized by:   Angelena Sole MD   Signed by:   Angelena Sole MD on 02/28/2010   Method used:  [facility]ically to        [facility]  [address] [id number]       [address]       [place], [place]  [address]       Ph: [phone] or [phone]       Fax: [phone]   RxID:   [id number]   Laboratory Results  Date/Time Received: February 28, 2010 4:20 PM  Date/Time Reported: February 28, 2010 4:28 PM   Wet [place] Source: vag WBC/hpf: 5-10 Bacteria/hpf: 3+  Rods Clue cells/hpf: none  Negative whiff Yeast/hpf: none Trichomonas/hpf: none Comments: ...............test performed by......Marland KitchenBonnie A. Swazil[place], MLS (ASCP)cm

## 2010-07-03 NOTE — Assessment & Plan Note (Signed)
Summary: sinus inf,df   Vital Signs:  Patient profile:   30 year old female Height:      62.5 inches Weight:      158.1 pounds BMI:     28.56 Temp:     98.8 degrees F oral Pulse rate:   80 / minute BP sitting:   121 / 81  (left arm) Cuff size:   regular  Vitals Entered By: Garen Grams LPN (January 18, 2010 9:00 AM) CC: sinus pain/congestion x 1 week Is Patient Diabetic? No Pain Assessment Patient in pain? no        Primary Care Jakyiah Briones:  Angelena Sole MD  CC:  sinus pain/congestion x 1 week.  History of Present Illness: since Friday has felt pressure in sinus and ear fullness and pain on right side.  tried phenylephrene and switched to psuedophedrine on monday.  no fevers.  today is day 7 of illness.  also has some mild diarrhea for 3 days.  Habits & Providers  Alcohol-Tobacco-Diet     Tobacco Status: never  Current Medications (verified): 1)  Naproxen 500 Mg Tabs (Naproxen) .Marland Kitchen.. 1 By Mouth Two Times A Day As Needed Pain. 2)  Flexeril 10 Mg Tabs (Cyclobenzaprine Hcl) .Marland Kitchen.. 1 By Mouth Three Times A Day As Needed 3)  Amoxicillin 500 Mg Caps (Amoxicillin) .... Take One Three Times A Day For 10 Days  Allergies (verified): No Known Drug Allergies  Review of Systems  The patient denies anorexia, fever, chest pain, prolonged cough, and abdominal pain.    Physical Exam  General:  Well-developed,well-nourished,in no acute distress; alert,appropriate and cooperative throughout examination Head:  Normocephalic and atraumatic without obvious abnormalities. No apparent alopecia or balding. Ears:  small amount of fluid present behind TM, no redness Nose:  no nasal discharge.   Mouth:  pharynx pink and moist.   Neck:  No deformities, masses, or tenderness noted. Lungs:  Normal respiratory effort, chest expands symmetrically. Lungs are clear to auscultation, no crackles or wheezes. Heart:  Normal rate and regular rhythm. S1 and S2 normal without gallop, murmur, click, rub or  other extra sounds. Cervical Nodes:  No lymphadenopathy noted   Impression & Recommendations:  Problem # 1:  SINUSITIS, FRONTAL, ACUTE (ICD-461.1) Assessment New symptoms localize to sinuses. no fevers, only 7 days.  gave symptom control instructions.  abx for ? bacterial to fill if goes longer than 10 days or if gets worse. Her updated medication list for this problem includes:    Amoxicillin 500 Mg Caps (Amoxicillin) .Marland Kitchen... Take one three times a day for 10 days  Orders: [facility]- Est Level  3 (14782)  Complete Medication List: 1)  Naproxen 500 Mg Tabs (Naproxen) .Marland Kitchen.. 1 by mouth two times a day as needed pain. 2)  Flexeril 10 Mg Tabs (Cyclobenzaprine hcl) .Marland Kitchen.. 1 by mouth three times a day as needed 3)  Amoxicillin 500 Mg Caps (Amoxicillin) .... Take one three times a day for 10 days  Patient Instructions: 1)  Continue with the decongestant and the motrin for headache. 2)  Try benedryl at night to see if it helps with symptoms.  If your nose gets overly dry and its not helpign the pressure, stop taking it. 3)  Consider trying the saline rinses for the nose to see if you get any relief. 4)  I am sending you with a perscription for an antibiotic.  I would recommend waiting to fill this until this has gone on for at least 10 days or you develop  fever.  Like I said, this is likely due to a virus which is not helped by antibiotics. Prescriptions: AMOXICILLIN 500 MG CAPS (AMOXICILLIN) take one three times a day for 10 days  #30 x 0   Entered and Authorized by:   Ellery Plunk MD   Signed by:   Ellery Plunk MD on 01/18/2010   Method used:   Print then Give to Patient   RxID:   [id number]

## 2010-07-03 NOTE — Assessment & Plan Note (Signed)
Summary: yeast ?/[place]/alm   Vital Signs:  Patient profile:   30 year old female Height:      62 inches Weight:      165 pounds BMI:     30.29 Temp:     98.7 degrees F oral Pulse rate:   86 / minute BP sitting:   118 / 79  (left arm)  Vitals Entered By: Tessie Fass CMA (August 04, 2009 8:48 AM) CC: yeast infection   Primary Care Provider:  Ancil Boozer  MD  CC:  yeast infection.  History of Present Illness: 30 YO [id number] 4 wks post partum here w/ concern for yeast infection.  1-Pt states vaginal area has been itchy and irritated over the last week. Pt states sxs feel like previous yeast infections that she has had before in the past. Pt denies any hx/o diabetes, recent antibiotic use, or recent sexual activity. Pt denies any dysuria, urinary frequency, or urinary urgency.  2-Axillary tenderness: Pt states that R axilla has been tender/sore for past 1-2 weeks. Pt notes having axillary tenderness in past which resolved with warm compresses and warm water over area. Pt denies any recent diagnosis of mastitis, localized skin infections, recent fevers, or illnesses. Warm compresses have helped w/ tenderness, however, pt states tenderness not completely resolved. Of note pt R hand dominant.   Allergies: No Known Drug Allergies  Review of Systems       Negative except as noted above in HPI  Physical Exam  General:  alert and well-developed.   Head:  normocephalic and atraumatic.   Eyes:  vision grossly intact.   Neck:  supple and full ROM.   Breasts:  no masses, no abnormal thickening, and no tenderness.  + Tenderness to deep palpation Long axilary lymph node chain.  Genitalia:  normal introitus and no external lesions, minimal vaginal discharge,normal introitus, no external lesions, mucosa pink and moist, and no vaginal or cervical lesions.   Neurologic:  alert & oriented X3.     Impression & Recommendations:  Problem # 1:  PRURITUS, VAGINAL (ICD-698.1) Pt w/ clue cells and +  whiff test on wet prep. Plan to treat w/ flagyl 500 mg by mouth once daily x 7 days-handwritten Rx given. Pt not breastfeeding. Pt educated that BV is not solely considered an STD (Pt concerned about husband giving her STD). Pt educated that also BV occurs in non sexually active patients.  Uptodate pt handout given on BV. Plan for followup in2 weeks for routine postpartum visit.   Problem # 2:  ACUTE LYMPHADENITIS (ICD-683) Pt w/ likely lymphadenitis in setting of recent breast engorgement and pt being R hand dominant. Soreness/tenderness likely secondary to transiently obstructed lymphatic flow. 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 Currently not concerned for malignancy/infectious process given absence of lesions, no erythema, no recent infections or fevers. No family history of cancer noted in non-distant family.  Plan to followup on lymphadenitis at postpartum followup. If lymphadenitis persists, may consider imaging to rule out malignancy/infection. Pt informed of treatment plan.   Complete Medication List: 1)  Cetirizine Hcl 10 Mg Tabs (Cetirizine hcl) .Marland Kitchen.. 1 by mouth once daily for allergies 2)  Prenatal 19 Tabs (Prenatal vit-dss-fe fum-fa) .... Take 1 prenatal vitamin daily during pregnancy. 3)  Ranitidine Hcl 150 Mg Tabs (Ranitidine hcl) .Marland Kitchen.. 1-2 by mouth once daily 4)  Zolpidem Tartrate 5 Mg Tabs (Zolpidem tartrate) .Marland KitchenMarland KitchenMarland Kitchen  One half to one full tab by mouth at bedtime as needed for trouble sleeping 5)  Hydrochlorothiazide 25 Mg Tabs (Hydrochlorothiazide) .... Take 1 tab by mouth daily  Other Orders: Wet Prep- [facility] [id number]) Urinalysis-[facility] (00000)  Appended Document: urine & wet prep report    Lab Visit  Laboratory Results   Urine Tests  Date/Time Received: August 04, 2009 9:00 AM  Date/Time Reported: August 04, 2009 10:33 AM   Routine Urinalysis   Color:  yellow Appearance: Clear Glucose: negative   (Normal Range: Negative) Bilirubin: negative   (Normal Range: Negative) Ketone: negative   (Normal Range: Negative) Spec. Gravity: 1.015   (Normal Range: 1.003-1.035) Blood: trace-intact   (Normal Range: Negative) pH: 7.0   (Normal Range: 5.0-8.0) Protein: 100   (Normal Range: Negative) Urobilinogen: 1.0   (Normal Range: 0-1) Nitrite: negative   (Normal Range: Negative) Leukocyte Esterace: small   (Normal Range: Negative)  Urine Microscopic WBC/HPF: 1-5 RBC/HPF: occ Bacteria/HPF: 2+ cocci Epithelial/HPF: 1-5    Comments: 4 cc spun ...............test performed by......Marland KitchenBonnie A. [place], MLS (ASCP)cm  Date/Time Received: August 04, 2009 9:00 AM  Date/Time Reported: August 04, 2009 10:34 AM   [place] Source: vag WBC/hpf: >20 Bacteria/hpf: 3+  Cocci Clue cells/hpf: moderate  Positive whiff Yeast/hpf: none Trichomonas/hpf: none Comments: ...............test performed by......Marland KitchenBonnie A. [place], MLS (ASCP)cm   Orders Today:   Appended Document: [facility] Charges update     Allergies: No Known Drug Allergies   Complete Medication List: 1)  Cetirizine Hcl 10 Mg Tabs (Cetirizine hcl) .Marland Kitchen.. 1 by mouth once daily for allergies 2)  Prenatal 19 Tabs (Prenatal vit-dss-fe fum-fa) .... Take 1 prenatal vitamin daily during pregnancy. 3)  Ranitidine Hcl 150 Mg Tabs (Ranitidine hcl) .Marland Kitchen.. 1-2 by mouth once daily 4)  Zolpidem Tartrate 5 Mg Tabs (Zolpidem tartrate) .... One half to one full tab by mouth at bedtime as needed for trouble sleeping 5)  Hydrochlorothiazide 25 Mg Tabs (Hydrochlorothiazide) .... Take 1 tab by mouth daily  Other Orders: [facility]- Est Level  3 ([id number])

## 2010-07-03 NOTE — Progress Notes (Signed)
Summary: phn msg  Phone Note Call from Patient Call back at [facility] Phone [phone]   Caller: Patient Summary of Call: Pt wondering when her next pap smear is due?   Initial call taken by: Clydell Hakim,  December 27, 2009 2:05 PM  Follow-up for Phone Call        Left message on vm for pt to call back Follow-up by: Garen Grams LPN,  December 27, 2009 4:48 PM  Additional Follow-up for Phone Call Additional follow up Details #1::        Left message on pt vm informing her that she would be due for pap December of this year. Told her to call back with questions. Additional Follow-up by: Garen Grams LPN,  December 28, 2009 10:43 AM

## 2010-07-03 NOTE — Progress Notes (Signed)
Summary: EMERGENCY LINE CALL  Phone Note Call from Patient Call back at Home Phone [phone]   Caller: Patient Summary of Call: Pt calling emergency line with ? 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  Went o [facility] and was seen by PA, told there was an abscess and drew circle around but no abx or I&D.  Bite site is significantly better, no pain, no swelling, no drainage, pt complaining that arm feels somewhat numb for the last hour or so.  No other symptoms.  No problems with speech, BP has been well controlled, no numbness elsewhere.  No speech changes.  No fevers/chills.  Hand not cold to touch.  Advised that she should be seen and should come to [facility] tomo morning and call for SDA.  Pt agrees and will do this.   Initial call taken by: Rodney Langton MD,  March 21, 2010 7:33 PM

## 2010-07-03 NOTE — Miscellaneous (Signed)
Summary: patient summary  Clinical Lists Changes  Observations: Added new observation of SOCIAL HX: Lives with 2 daughters (brittany banks [phone], brianica banks b1999)and sons (jaylin Delmonaco [phone], marquise Wilczynski [phone]). No tob/etoh/drugs.  Church-goer.  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  Works at [employer] [place].  recently married (11/2008) to father of children Maryelizabeth Eberle (11/09/2009 14:37) Added new observation of PAST MED HX: [id number] with first pregnancy in teenage years.  hx abnormal pap with normal colpo 1998 hx gonorrhea 1998 tx depression off and on  some somatization at times (11/09/2009 14:37)     Past, Family, and Social History Past History: [id number] with first pregnancy in teenage years.  hx abnormal pap with normal colpo 1998 hx gonorrhea 1998 tx depression off and on  some somatization at times Social History: Lives with 2 daughters (brittany banks [phone], brianica banks b1999)and sons (jaylin Boulanger b2004, marquise Meyerhoff [phone]). No tob/etoh/drugs.  Church-goer.  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  Works at [employer] [place].  recently married (11/2008) to father of children [employer]

## 2010-07-03 NOTE — Miscellaneous (Signed)
  Clinical Lists Changes  Problems: Removed problem of SCREENING FOR MALIGNANT NEOPLASM OF THE CERVIX (ICD-V76.2) Removed problem of CHEST PAIN UNSPECIFIED (ICD-786.50) Removed problem of VAGINITIS, CANDIDAL (ICD-112.1) Removed problem of BACK PAIN (ICD-724.5) Removed problem of PAIN IN JOINT OTHER SPECIFIED SITES (ICD-719.48) Removed problem of VAGINAL PRURITUS (ICD-698.1) Removed problem of FATIGUE (ICD-780.79) Removed problem of HYPERHIDROSIS (ICD-780.8)

## 2010-07-03 NOTE — Progress Notes (Signed)
Summary: triage  Phone Note Call from Patient Call back at Home Phone [phone]   Caller: Patient Summary of Call: has a question about BP Initial call taken by: De Nurse,  July 21, 2009 4:17 PM  Follow-up for Phone Call        Patient called to report that she is now experiencing HA and dizziness.  After consulting with preceptor, called pt back and instructed her to go to [facility] for evaluation.  Labs from yesterday's visit were faxed to [facility]. Follow-up by: Dennison Nancy RN,  July 21, 2009 4:36 PM     Appended Document: triage states she did not go to [facility]. her mom is a nurse & told her to take a hot bath & lay down. her mother in law kept the baby for her. states it has been steadily going down. highest this weekend was 130/84. lowest has been 113/72. states she feels fine. has not had any salt over the weekend. reviewed red flags again. if they occur, go to [facility] asap. she agreed.

## 2010-07-03 NOTE — Progress Notes (Signed)
Summary: triage  Phone Note Call from Patient Call back at Home Phone [phone]   Caller: Patient Summary of Call: needs to talk to nurse about yeast inf. Initial call taken by: De Nurse,  August 02, 2009 2:42 PM  Follow-up for Phone Call        left message Follow-up by: Golden Circle RN,  August 02, 2009 2:55 PM  Additional Follow-up for Phone Call Additional follow up Details #1::        states she has a yeast infection & wants diflucan. appt made fri at 8:30 as she has a lab appt at that time as well Additional Follow-up by: Golden Circle RN,  August 02, 2009 3:07 PM

## 2010-07-03 NOTE — Progress Notes (Signed)
  Phone Note Call from Patient   Caller: Patient Call For: [phone] Summary of Call: Please call Mrs. Saksa back regarding another refill for her Diflucan.  If called in the next 15 mins will be able to take call, otherwise leave msg and will call back after class. Initial call taken by: Abundio Miu,  March 07, 2010 11:44 AM  Follow-up for Phone Call        LM that I was sending this request to pcp Follow-up by: Golden Circle RN,  March 07, 2010 11:45 AM  Additional Follow-up for Phone Call Additional follow up Details #1::        Her last wet prep was negative for yeast but we treated her anyway.  If she is still having symptoms she will need to come back for another office visit. Additional Follow-up by: Angelena Sole MD,  March 07, 2010 11:49 AM    Additional Follow-up for Phone Call Additional follow up Details #2::    LM for her to call back & make an appt for tomorrow Follow-up by: Golden Circle RN,  March 07, 2010 11:52 AM   Appended Document:  states she has slight itching around the opening & a clear discharge. wears cotton underwear & clothes are not tight. states she is allergic to monistat. took it years ago & had a bad rash from it. does not want to come back in to see md. she is going to try another natifungal on labia to see if that helps. told her she should see md. she will call us if she changes her mind

## 2010-07-03 NOTE — Progress Notes (Signed)
Summary: Spider bite?   Phone Note Call from Patient   Summary of Call: Thinks she was bitten by a spider today. Already at Urgent Care. Advised that I would let her doctor know.  Initial call taken by: Bobby Rumpf  MD,  March 20, 2010 6:21 PM

## 2010-07-03 NOTE — Miscellaneous (Signed)
Summary: [place] results  Clinical Lists Changes Marcelino Duster from [facility] called [phone]) & left message asking that we fax [phone]) the [place] results to them. I LM for pt to call us. will need a roi.Marland KitchenMarland KitchenGolden Circle RN  November 30, 2009 4:49 PM  pt returned call  - [phone] De Nurse  December 01, 2009 1:43 PM  LM .  When she calls back-ask her to stop by & sign ROI. ask her who her md office is going to be.   I spoke with the staff at [facility]. they are her pcp!  told staff there we thought we were since she has been coming here as recently as 10/31/09. states she established there 4/11. told her we will be happy to fax all records there if that is what pt wants. will wait to hear back from pt.Marland KitchenMarland KitchenMarland KitchenGolden Circle RN  December 01, 2009 2:51 PM  pt called me back. [facility]. does not want them to have records.Golden Circle RN  December 07, 2009 9:15 AM

## 2010-07-03 NOTE — Progress Notes (Signed)
Summary: triage  Phone Note Other Incoming   Caller: JOY  [employer] Summary of Call: BP today on left side 154/96 on right side 148/100.  Having some pressure in chest when lying down only. Joy said just to call the mother about this. Initial call taken by: Clydell Hakim,  July 20, 2009 12:06 PM  Follow-up for Phone Call        she is in a program that has a nurse come to the home to check mom & baby. told her we did need to see here today. she will be here for 1:30 work in. aware there may be a wait & that her pcp will not be here. no symptoms Follow-up by: Golden Circle RN,  July 20, 2009 12:17 PM
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
## 2010-07-03 NOTE — Progress Notes (Signed)
Summary: triage  Phone Note Call from Patient Call back at Home Phone [phone]   Caller: Patient Summary of Call: Pt returning Denaja Verhoeven's call. Initial call taken by: Clydell Hakim,  December 07, 2009 8:56 AM  Follow-up for Phone Call        states she is a pt here. [facility]. she does not want her records faxed there as she will not be going back Follow-up by: Golden Circle RN,  December 07, 2009 9:12 AM

## 2010-07-03 NOTE — Assessment & Plan Note (Signed)
Summary: ob/kh   Vital Signs:  Patient profile:   30 year old female Weight:      189 pounds BP sitting:   140 / 62  Vitals Entered By: Jone Baseman CMA (June 14, 2009 9:40 AM)  Serial Vital Signs/Assessments:  Time      Position  BP       Pulse  Resp  Temp     By                     130/70                         Angelena Sole MD   Primary Care Provider:  Ancil Boozer  MD   History of Present Illness: 1. Elevated blood pressure:  Initially 140/62 but rechecked at 130/70.  She reports a history of preeclampsia with her last pregnancy but no mention of this in her prior records.  Looks like her baseline is around 120-130/60-80.   Denies any headache, vision changes, RUQ pain.  Endorses some extremity swelling.  2. Pt went to [facility]U this past Sunday because she thought that her water broke and was having contractions every 2-3 minutes.  In [facility]U they determined that her water didn't break and that she wasn't in labor.  No cervical change (2/60/-3).  She has been having contraction every 10-15 minutes since then.  Denies any vaginal bleeding or leakage of fluid.  Thinks that she lost her mucous plug a couple days ago.  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 [id number], currently pregnant with 4th child hx abnormal pap with normal colpo 1998 hx  gonorrhea 1998 tx depression off and on   Social History: Reviewed history from 11/03/2008 and no changes required. Lives with 2 daughters (brittany, brianica)and son Monico Blitz). No tob/etoh/drugs.  Church-goer.  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  Works at [employer] [place].  recently married (11/2008) to father of children Kelvin HooksPacks/Day:  na  Physical Exam  General:  Well-developed,well-nourished,in no acute distress; alert,appropriate and cooperative throughout examination Eyes:  vision grossly intact, pupils equal, pupils round, and pupils reactive to light.   Lungs:  CTABL Heart:  RRR,  with 2/6 SEM at bilateral upper sternal borders. no gallops, peripheral pulses intact,  Abdomen:  Gravid c/w dates Extremities:  trace lower extremity edema   Impression & Recommendations:  Problem # 1:  ELEVATED BLOOD PRESSURE (ICD-796.2) Seems near baseline when it was rechecked.  Discussed with Dr. Swaziland.  Given hx of preeclampsia will check PIH labs here.  Will have her follow up in 1 week. Orders: UA Glucose/Protein-[facility] (81002) CBC-[facility] (13086) Comp Met-[facility] (57846-96295) LDH-[facility] (28413) Other OB visit- [facility] (OBCK)  Problem # 2:  PREGNANCY, MULTIGRAVIDA (ICD-V22.1) Assessment: Unchanged  Orders: UA Glucose/Protein-[facility] (81002) Other OB visit- [facility] ([facility])  Complete Medication List:  1)  Cetirizine Hcl 10 Mg Tabs (Cetirizine hcl) .Marland Kitchen.. 1 by mouth once daily for allergies 2)  Prenatal 19 Tabs (Prenatal vit-dss-fe fum-fa) .... Take 1 prenatal vitamin daily during pregnancy. 3)  Ranitidine Hcl 150 Mg Tabs (Ranitidine hcl) .Marland Kitchen.. 1-2 by mouth once daily  Patient Instructions: 1)  I'm glad to be taking over your care 2)  We are going to check some blood work today since your blood pressure was up a little bit 3)  I would like for you to be seen sooner than one week. Please schedule a follow up appt this Friday with Dr. Lanier Prude or next Tuesday with Dr. Denyse Amass and then next  Friday with me. 4)  If you start having frequent contractions, vaginal bleeding, or your water breaks go to [facility] 5)  Let me know when you feel like you are unable to continue working    [employer] for Follow-up Visit    Estimated weeks of       gestation:     37 2/7    Weight:     189    Blood pressure:   140 / 62    Urine Protein:     negative    Urine Glucose:   negative    Headache:     No    Nausea/vomiting:   No    Edema:     TrLE    Vaginal bleeding:   no    Vaginal discharge:   no    Fundal height:      39    FHR:       145    Fetal activity:     yes    Labor symptoms:   yes    Cx Dilation:     2    Cx Effacement:   60%    Cx Station:     -3    Taking prenatal vits?   Y    Smoking:     na    Next visit:     1 wk    Resident:     Lelon Perla    Preceptor:     Sw[place]and   Laboratory Results   Urine Tests  Date/Time Received: June 14, 2009 9:43 AM  Date/Time Reported: June 14, 2009 9:46 AM   Routine Urinalysis   Glucose: negative   (Normal Range: Negative) Protein: negative   (Normal Range: Negative)    Comments: ...............test performed by......Marland KitchenBonnie A. Swaziland, MLS (ASCP)cm

## 2010-07-03 NOTE — Miscellaneous (Signed)
  Clinical Lists Changes  Medications: Added new medication of HYDROCHLOROTHIAZIDE 12.5 MG CAPS (HYDROCHLOROTHIAZIDE) 1 tab by mouth daily - Signed Rx of HYDROCHLOROTHIAZIDE 12.5 MG CAPS (HYDROCHLOROTHIAZIDE) 1 tab by mouth daily;  #90 x 3;  Signed;  Entered by: Angelena Sole MD;  Authorized by: Angelena Sole MD;  Method used: Electronically to [facility]  [address] [id number] [address] [place], [place]  [address], Ph: [phone] or [phone], Fax: [phone]    Prescriptions: HYDROCHLOROTHIAZIDE 12.5 MG CAPS (HYDROCHLOROTHIAZIDE) 1 tab by mouth daily  #90 x 3   Entered and Authorized by:   Angelena Sole MD   Signed by:   Angelena Sole MD on 01/26/2010   Method used:   [facility] to        [facility]  [address] [id number] (retail)       [address]       [place], [place]  [address]       Ph: [phone] or [phone]       Fax: [phone]   RxID:   [id number]

## 2010-07-03 NOTE — Progress Notes (Signed)
Summary: phnmsg  Phone Note Call from Patient Call back at Home Phone [phone]   Caller: Patient Summary of Call: pt is questionning whether she should have a pap before december? Initial call taken by: De Nurse,  December 28, 2009 4:00 PM  Follow-up for Phone Call        no next pap not due until after december 11th.  thanks  Follow-up by: Ancil Boozer  MD,  December 28, 2009 4:07 PM  Additional Follow-up for Phone Call Additional follow up Details #1::        informed pt Additional Follow-up by: De Nurse,  December 29, 2009 11:33 AM

## 2010-07-03 NOTE — Progress Notes (Signed)
Summary: triage  Phone Note Call from Patient   Caller: Patient Summary of Call: Seen Friday for yeast infection and still getting worse. Initial call taken by: Clydell Hakim,  January 08, 2010 2:42 PM  Follow-up for Phone Call        she only got one pill dispensed. told her she is to take another 24 hrs after the first. I resent it at her request.  also c/o continuing aches & pains since birth of child. I read her the letter telling her tests were negative. she made an appt next Tues to see pcp about this advised taking the NSAID with food Follow-up by: Golden Circle RN,  January 08, 2010 2:44 PM  Additional Follow-up for Phone Call Additional follow up Details #1::        spoke with the pharmacist who said she should have gotten 2 tabs as they billed for 2 tabs. he will not fill the other until the pt calls them. I LM for pt to call me back. will ask her to call the pharmacy Additional Follow-up by: Golden Circle RN,  January 08, 2010 3:06 PM    Additional Follow-up for Phone Call Additional follow up Details #2::    she will call the pharmacy Follow-up by: Golden Circle RN,  January 08, 2010 3:36 PM  Prescriptions: FLUCONAZOLE 150 MG TABS (FLUCONAZOLE) 1 by mouth daily x 1, repeat in 24 hours  #2 x 0   Entered by:   Golden Circle RN   Authorized by:   Angelena Sole MD   Signed by:   Golden Circle RN on 01/08/2010   Method used:   [facility]        [facility]  [address] [id number]       [address]       [place], [place]  [address]       Ph: [phone] or [phone]       Fax: [phone]   RxID:   [id number]

## 2010-07-03 NOTE — Assessment & Plan Note (Signed)
Summary: place under arm,tcb   Vital Signs:  Patient profile:   30 year old female Height:      62.5 inches Weight:      164 pounds BMI:     29.62 BSA:     1.77 Temp:     98.6 degrees F Pulse rate:   87 / minute BP sitting:   136 / 90  Vitals Entered By: Jone Baseman CMA (September 11, 2009 9:33 AM) CC: knot under right breast, f/u BP Is Patient Diabetic? No Pain Assessment Patient in pain? no        Primary Care Provider:  Ancil Boozer  MD  CC:  knot under right breast and f/u BP.  History of Present Illness: breast: was seen for R arm and shoulder pain by Dr Lelon Perla - thought to be 2/2 lifting new infant's car seat.  was rx ibuprofen which after some time in combination with no longer lifting with R arm completely resolved the pain she was having.  then when doing her regular breast examinations felt a tender spot and ? knot in R breast - sharp in nature.  after doing this the arm pains returned in the same manner.  she denies any redness or warmth to the breast.  she is not breast feeding and has no nipple discharge.  she denies fevers.  sometimes she has similar pains in the left breast as well.  her msntrual cycles have been normal on the OCPS.   BP: quit taking HCTZ - states  it made her feel drowsy, sluggish.  she never tried cutting it in half and taking 1/2 dose.  denies chest pains.  denies leg swelling.   Habits & Providers  Alcohol-Tobacco-Diet     Tobacco Status: never  Current Medications (verified): 1)  Hydrochlorothiazide 12.5 Mg Caps (Hydrochlorothiazide) .Marland Kitchen.. 1 Tab By Mouth Once Daily For Blood Pressure 2)  Naproxen 500 Mg Tabs (Naproxen) .Marland Kitchen.. 1 By Mouth Two Times A Day As Needed Pain. 3)  Sprintec 28 0.25-35 Mg-Mcg Tabs (Norgestimate-Eth Estradiol) .... Take 1 Tab By Mouth Daily Dispo: 1 Pack  Allergies (verified): No Known Drug Allergies  Past History:  Past medical, surgical, family and social histories (including risk factors) reviewed for  relevance to current acute and chronic problems.  Past Medical History: Reviewed history from 07/20/2009 and no changes required. [id number] hx abnormal pap with normal colpo 1998 hx gonorrhea 1998 tx depression off and on   Past Surgical History: Reviewed history from 03/14/2008 and no changes required. 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 brother - 91 healthy, father - died age 50 with leukemia,  gma - dm,  mother - htn aunt with htn all children are heatlhy  Social History: Reviewed history from 07/20/2009 and no changes required. Lives with 2 daughters (brittany, brianica)and sons ([place], [place]). No tob/etoh/drugs.  Church-goer.  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  Works at [employer] [place].  recently married (11/2008) to father of children Aftyn Nott  Review of Systems       per HPI.  denies any heartburn or indigestion with taking of ibuprofen  Physical Exam  General:  alert and well-developed.   Breasts:  skin/areolae normal.  fibrocystic changes at inferior borders of breasts bilaterally that is tender and is the pain and knot she reports feeling.  no nipple discharge.  no axillary LAD Lungs:  normal respiratory effort, no accessory muscle use,  normal breath sounds, and no crackles.   Heart:  RRR, with systolic flow murmur at bilateral upper sternal borders   Impression & Recommendations:  Problem # 1:  BREAST TENDERNESS (ICD-611.71) Assessment New  suspect her discomfort is 2/2 fibrocystic changes.  encouraged good support bra, use NSAIDs as needed.  continue to watch how much lifting she does of new infant.  continue to monitor symptoms closely for any changes.    Orders: [facility]- Est  Level 4 (16109)  Problem # 2:  ESSENTIAL HYPERTENSION, BENIGN (ICD-401.1) Assessment: Unchanged  encouraged medication use.  will try 1/2 dose of current dose (change to HCTZ 12.5 once  daily)     BP today: 136/90 Prior BP: 138/81 (08/22/2009)  Prior 10 Yr Risk Heart Disease: Not enough information (06/30/2009)  Labs Reviewed: K+: 4.0 (07/20/2009) Creat: : 0.79 (07/20/2009)     Orders: [facility]- Est  Level 4 (60454)  Complete Medication List: 1)  Hydrochlorothiazide 12.5 Mg Caps (Hydrochlorothiazide) .Marland Kitchen.. 1 tab by mouth once daily for blood pressure 2)  Naproxen 500 Mg Tabs (Naproxen) .Marland Kitchen.. 1 by mouth two times a day as needed pain. 3)  Sprintec 28 0.25-35 Mg-mcg Tabs (Norgestimate-eth estradiol) .... Take 1 tab by mouth daily dispo: 1 pack  Patient Instructions: 1)  What I think you have are fibrocystic changes in your breast - this is not dangerous, just uncomfortable.   2)  Try the naproxen as needed to help your discomfort.  3)  Start taking the new 1/2 dose of your blood pressure medicine and see if it agrees more with you. 4)  Schedule to get your Mirena placed. 5)  It was nice to see you today! Prescriptions: HYDROCHLOROTHIAZIDE 12.5 MG CAPS (HYDROCHLOROTHIAZIDE) 1 tab by mouth once daily for blood pressure  #32 x PRN   Entered and Authorized by:   Ancil Boozer  MD   Signed by:   Ancil Boozer  MD on 09/11/2009   Method used:   Handwritten   RxID:   [id number] NAPROXEN 500 MG TABS (NAPROXEN) 1 by mouth two times a day as needed pain.  #60 x PRN   Entered and Authorized by:   Ancil Boozer  MD   Signed by:   Ancil Boozer  MD on 09/11/2009   Method used:   Handwritten   RxID:   [id number]

## 2010-07-03 NOTE — Letter (Signed)
Summary: Results Follow-up Letter  All     ,     Phone:   Fax:     01/08/2010  [address] [place], [place]  [address]  Dear Ms. Marlette,   The following are the results of your recent test(s):  Test for Rheumatoid Arthritis        Negative Test for Lupus         Negative Test fro Inflammation in body     Negative  Please call for an appointment with your primary care physician.  They can further assist you in your problem.  Sincerely,     Tinnie Gens MD

## 2010-07-03 NOTE — Assessment & Plan Note (Signed)
Summary: PNV 39+4 wks   Vital Signs:  Patient profile:   30 year old female Weight:      192 pounds Temp:     98.4 degrees F oral Pulse rate:   101 / minute Pulse rhythm:   regular BP sitting:   124 / 78  (left arm) Cuff size:   regular  Vitals Entered By: Loralee Pacas CMA (June 30, 2009 8:48 AM)   Prenatal Visit      This is a 30 years old female G4, P3, T3, PT0, LC3, Ab0 who is in for a prenatal visit.  Since the last visit, she notes that she is doing well but has some concerns.  Concerns noted: intermittent back pain/contractions   Flowsheet View for Follow-up Visit    Estimated weeks of       gestation:     39 4/7    Weight:     192    Blood pressure:   124 / 78    Headache:     No    Nausea/vomiting:   No    Edema:     TrLE    Vaginal bleeding:   no    Vaginal discharge:   no    Fundal height:      40    FHR:       140s    Fetal activity:     yes    Labor symptoms:   few ctx    Taking prenatal vits?   Y    Smoking:     n/a    Next visit:     1 wk    Resident:     Lanier Prude    Preceptor:     Chambliss   Physical Exam  General:  Vitals reviewed.  Gravid. appears comfortable. not actively contracting.  Abdomen:  Gravid c/w dates Genitalia:  deferred   Impression & Recommendations:  Problem # 1:  PREGNANCY, MULTIGRAVIDA (ICD-V22.1) Assessment Unchanged  30 y/o G4P3003 at 39+4 wks. doing well. not in active labor. labor precautions given. due date 1/31. will schedule for induction on 2/7. set up for NST, BPP x2 next week.   -female infant, desires circumcision while in hospital -IUD postpartum -3 hr GTT 80, 127,125, 95 -HIV, RPR, GC/Chl negative -HBsaG negative -Rubella immune -Sickle cell screen negative -BLOOD TYPE B NEGATIVE *** RECEIVED RHOGAM AT 28 WEEKS*** -GBS negative  Orders: Other OB visit- [facility]) Obstetric Referral (Obstetric)  Problem # 2:  ELEVATED BLOOD PRESSURE (ICD-796.2) Assessment: Improved normal today. monitor  Complete  Medication List: 1)  Cetirizine Hcl 10 Mg Tabs (Cetirizine hcl) .Marland Kitchen.. 1 by mouth once daily for allergies 2)  Prenatal 19 Tabs (Prenatal vit-dss-fe fum-fa) .... Take 1 prenatal vitamin daily during pregnancy. 3)  Ranitidine Hcl 150 Mg Tabs (Ranitidine hcl) .Marland Kitchen.. 1-2 by mouth once daily 4)  Zolpidem Tartrate 5 Mg Tabs (Zolpidem tartrate) .... One half to one full tab by mouth at bedtime as needed for trouble sleeping    Patient Instructions: 1)  If you have any: leaking of fluid, vaginal discharge, vaginal bleeding, decreased movement of the baby, severe abdominal pain, and/or painful regular contractions, you should call our office or go straight to Maternity Admissions at [facility] 2)  Take your prenatal vitamin daily

## 2010-07-03 NOTE — Assessment & Plan Note (Signed)
Summary: abd pain,df   Vital Signs:  Patient profile:   30 year old female Height:      62.5 inches Weight:      148.44 pounds BMI:     26.81 BSA:     1.70 Temp:     98.4 degrees F Pulse rate:   72 / minute BP sitting:   120 / 82  Vitals Entered By: Jone Baseman CMA (March 26, 2010 11:44 AM) CC: Abd pain x 3 days Is Patient Diabetic? No Pain Assessment Patient in pain? yes     Location: right side Intensity: 4   Primary Care Provider:  Angelena Sole MD  CC:  Abd pain x 3 days.  History of Present Illness: 1) Abdominal pain: Reports pelvic cramping x 3 days. LMP was 2 weeks ago. Occasional brownish color reported when she wipes after urination - urine darker than usual, but no discharge or odor reported. Cramping is moderate and bilateral and in pelvic area. No contraception currently, monogamous relationship with husband x 10 years. Reports some intermittent left breast pain as well since the birth of her child earlier this year but is not currently breast feeding. Has not taken anything for pain.   Denies: discharge, nausea, vomiting or diarrhea, constipation, fever, myalgia, flank pain, dyspnea, cough, wheeze   Med rec as per prior meds except ibuprofen.   Habits & Providers  Alcohol-Tobacco-Diet     Tobacco Status: never     Cigarette Packs/Day: n/a  Medications Prior to Update: 1)  Flexeril 10 Mg Tabs (Cyclobenzaprine Hcl) .Marland Kitchen.. 1 By Mouth Three Times A Day As Needed 2)  Hydrochlorothiazide 12.5 Mg Caps (Hydrochlorothiazide) .Marland Kitchen.. 1 Tab By Mouth Daily 3)  Fluconazole 150 Mg Tabs (Fluconazole) .... Take 1 Tab By Mouth 4)  Ibuprofen 600 Mg Tabs (Ibuprofen) .Marland Kitchen.. 1 Tab By Mouth Every 6 Hours For Pain X 5 Days  Allergies (verified): No Known Drug Allergies  Physical Exam  General:  vitals reviewed.  well appearing.  no acute distress Head:  Normocephalic and atraumatic without obvious abnormalities. No apparent alopecia or balding. Mouth:  pharynx pink and  moist.   Lungs:  normal respiratory effort, normal breath sounds, no crackles, and no wheezes.   Heart:  normal rate, regular rhythm, and no murmur.   Abdomen:  mild tender to palpation pelvic region. soft, normal bowel sounds, no masses, no guarding, no rigidity, and no rebound tenderness.     Impression & Recommendations:  Problem # 1:  ABDOMINAL CRAMPS (ICD-789.00) Assessment New  Possbily (along with left breast pain) related to hormonal changes post-partum (though patient is 6 months post-partum). UA, Upreg unremarkable. Will have patient take high dose ibuprofen scheduled three times a day for prostagladin inhibition to see if this might aid with symptoms. Follow up with PCP in two weeks. Red flags reviewed.   Orders: [facility]- Est Level  3 (16109)  Complete Medication List: 1)  Flexeril 10 Mg Tabs (Cyclobenzaprine hcl) .Marland Kitchen.. 1 by mouth three times a day as needed 2)  Hydrochlorothiazide 12.5 Mg Caps (Hydrochlorothiazide) .Marland Kitchen.. 1 tab by mouth daily 3)  Fluconazole 150 Mg Tabs (Fluconazole) .... Take 1 tab by mouth 4)  Ibuprofen 600 Mg Tabs (Ibuprofen) .Marland Kitchen.. 1 tab by mouth every 6 hours for pain x 5 days  Other Orders: Urinalysis-[facility] (00000) U Preg-[facility] (81025)   Orders Added: 1)  Urinalysis-[facility] [00000] 2)  U Preg-[facility] [81025] 3)  [facility]- Est Level  3 [60454]    Laboratory Results   Urine  Tests  Date/Time Received: March 26, 2010 11:59 AM  Date/Time Reported: March 26, 2010 12:04 PM   Routine Urinalysis   Color: yellow Appearance: Clear Glucose: negative   (Normal Range: Negative) Bilirubin: negative   (Normal Range: Negative) Ketone: negative   (Normal Range: Negative) Spec. Gravity: 1.025   (Normal Range: 1.003-1.035) Blood: negative   (Normal Range: Negative) pH: 6.0   (Normal Range: 5.0-8.0) Protein: negative   (Normal Range: Negative) Urobilinogen: 0.2   (Normal Range: 0-1) Nitrite: negative   (Normal Range: Negative) Leukocyte Esterace: negative   (Normal  Range: Negative)    Urine HCG: negative Comments: ...............test performed by......Marland KitchenBonnie A. [place], MLS (ASCP)cm     Appended Document: abd pain,df Add to Assessment and Plan: Overall exam reassuring. No red flags on history or exam.

## 2010-07-06 ENCOUNTER — Encounter (INDEPENDENT_AMBULATORY_CARE_PROVIDER_SITE_OTHER): Payer: Managed Care, Other (non HMO)

## 2010-07-06 ENCOUNTER — Encounter: Payer: Self-pay | Admitting: Family Medicine

## 2010-07-06 DIAGNOSIS — I10 Essential (primary) hypertension: Secondary | ICD-10-CM

## 2010-07-07 ENCOUNTER — Emergency Department (HOSPITAL_COMMUNITY)
Admission: EM | Admit: 2010-07-07 | Discharge: 2010-07-07 | Disposition: A | Discharge status: Home / Self Care | Payer: Managed Care, Other (non HMO) | Attending: Emergency Medicine | Admitting: Emergency Medicine

## 2010-07-07 DIAGNOSIS — I10 Essential (primary) hypertension: Secondary | ICD-10-CM | POA: Insufficient documentation

## 2010-07-07 DIAGNOSIS — R5383 Other fatigue: Secondary | ICD-10-CM | POA: Insufficient documentation

## 2010-07-07 DIAGNOSIS — R5381 Other malaise: Secondary | ICD-10-CM | POA: Insufficient documentation

## 2010-07-07 DIAGNOSIS — R209 Unspecified disturbances of skin sensation: Secondary | ICD-10-CM | POA: Insufficient documentation

## 2010-07-07 LAB — POCT PREGNANCY, URINE: Preg Test, Ur: NEGATIVE

## 2010-07-11 NOTE — Assessment & Plan Note (Signed)
Summary: BP check  Nurse Visit  patient in office for BP check because last night she took her BP herself with a manual monitor and got 140/90. BP today checked manually with regular adult cuff. BP LA 140/802 and RA 130/72 pulse 72.  will forward readings to MD.   Theresia Lo RN  July 06, 2010 11:38 AM   Allergies: No Known Drug Allergies  Orders Added: 1)  No Charge Patient Arrived (NCPA0) [NCPA0]

## 2010-08-16 LAB — URINE MICROSCOPIC-ADD ON

## 2010-08-16 LAB — COMPREHENSIVE METABOLIC PANEL
ALT: 17 U/L (ref 0–35)
AST: 21 U/L (ref 0–37)
Albumin: 4 g/dL (ref 3.5–5.2)
Alkaline Phosphatase: 56 U/L (ref 39–117)
Chloride: 107 mEq/L (ref 96–112)
GFR calc Af Amer: >60 mL/min (ref >60)
Potassium: 3.5 mEq/L (ref 3.5–5.1)
Sodium: 140 mEq/L (ref 135–145)
Total Bilirubin: 0.3 mg/dL (ref 0.3–1.2)
Total Protein: 7.2 g/dL (ref 6.0–8.3)

## 2010-08-16 LAB — DIFFERENTIAL
Basophils Relative: 0 % (ref 0–1)
Eosinophils Relative: 2 % (ref 0–5)
Monocytes Absolute: 0.5 10*3/uL (ref 0.1–1.0)
Monocytes Relative: 7 % (ref 3–12)
Neutro Abs: 3.8 10*3/uL (ref 1.7–7.7)

## 2010-08-16 LAB — URINALYSIS, ROUTINE W REFLEX MICROSCOPIC
Bilirubin Urine: NEGATIVE
Ketones, ur: NEGATIVE mg/dL
Specific Gravity, Urine: 1.021 (ref 1.005–1.030)
Urobilinogen, UA: 1 mg/dL (ref 0.0–1.0)

## 2010-08-16 LAB — URINE CULTURE: Colony Count: NO GROWTH

## 2010-08-16 LAB — CBC
Platelets: 286 10*3/uL (ref 150–400)
RBC: 4.54 MIL/uL (ref 3.87–5.11)
RDW: 13.4 % (ref 11.5–15.5)
WBC: 7.2 10*3/uL (ref 4.0–10.5)

## 2010-08-16 LAB — PREGNANCY, URINE: Preg Test, Ur: NEGATIVE

## 2010-08-21 LAB — URINALYSIS, ROUTINE W REFLEX MICROSCOPIC
Glucose, UA: NEGATIVE mg/dL
Hgb urine dipstick: NEGATIVE
Specific Gravity, Urine: 1.022 (ref 1.005–1.030)

## 2010-08-21 LAB — POCT I-STAT, CHEM 8
BUN: 9 mg/dL (ref 6–23)
Creatinine, Ser: 0.8 mg/dL (ref 0.4–1.2)
Potassium: 3.5 mEq/L (ref 3.5–5.1)
Sodium: 142 mEq/L (ref 135–145)

## 2010-08-22 LAB — RPR: RPR Ser Ql: NONREACTIVE

## 2010-08-22 LAB — URINALYSIS, ROUTINE W REFLEX MICROSCOPIC
Glucose, UA: NEGATIVE mg/dL
Specific Gravity, Urine: <1.005 — ABNORMAL LOW (ref 1.005–1.030)
pH: 7 (ref 5.0–8.0)

## 2010-08-22 LAB — CCBB MATERNAL DONOR DRAW

## 2010-08-22 LAB — RH IMMUNE GLOB WKUP(>/=20WKS)(NOT WOMEN'S HOSP)

## 2010-08-22 LAB — URINE MICROSCOPIC-ADD ON

## 2010-08-22 LAB — CBC
HCT: 32.6 % — ABNORMAL LOW (ref 36.0–46.0)
Hemoglobin: 12.2 g/dL (ref 12.0–15.0)
MCV: 94.9 fL (ref 78.0–100.0)
Platelets: 147 10*3/uL — ABNORMAL LOW (ref 150–400)
Platelets: 183 10*3/uL (ref 150–400)
RDW: 14.4 % (ref 11.5–15.5)
WBC: 11.3 10*3/uL — ABNORMAL HIGH (ref 4.0–10.5)
WBC: 12.5 10*3/uL — ABNORMAL HIGH (ref 4.0–10.5)

## 2010-09-03 LAB — STREP B DNA PROBE: Strep Group B Ag: NEGATIVE

## 2010-09-03 LAB — URINALYSIS, ROUTINE W REFLEX MICROSCOPIC
Bilirubin Urine: NEGATIVE
Glucose, UA: NEGATIVE mg/dL
Hgb urine dipstick: NEGATIVE
Ketones, ur: NEGATIVE mg/dL
Protein, ur: NEGATIVE mg/dL

## 2010-09-03 LAB — GLUCOSE, CAPILLARY: Glucose-Capillary: 86 mg/dL (ref 70–99)

## 2010-09-08 LAB — URINALYSIS, ROUTINE W REFLEX MICROSCOPIC
Bilirubin Urine: NEGATIVE
Glucose, UA: NEGATIVE mg/dL
Hgb urine dipstick: NEGATIVE
Specific Gravity, Urine: 1.015 (ref 1.005–1.030)
pH: 7 (ref 5.0–8.0)

## 2010-09-09 LAB — GLUCOSE, CAPILLARY
Glucose-Capillary: 147 mg/dL — ABNORMAL HIGH (ref 70–99)
Glucose-Capillary: 98 mg/dL (ref 70–99)

## 2010-12-01 ENCOUNTER — Emergency Department (INDEPENDENT_AMBULATORY_CARE_PROVIDER_SITE_OTHER): Payer: Managed Care, Other (non HMO)

## 2010-12-01 ENCOUNTER — Emergency Department (HOSPITAL_BASED_OUTPATIENT_CLINIC_OR_DEPARTMENT_OTHER)
Admission: EM | Admit: 2010-12-01 | Discharge: 2010-12-01 | Disposition: A | Discharge status: Home / Self Care | Payer: Managed Care, Other (non HMO) | Attending: Emergency Medicine | Admitting: Emergency Medicine

## 2010-12-01 ENCOUNTER — Encounter (HOSPITAL_BASED_OUTPATIENT_CLINIC_OR_DEPARTMENT_OTHER): Payer: Self-pay | Admitting: Radiology

## 2010-12-01 DIAGNOSIS — I10 Essential (primary) hypertension: Secondary | ICD-10-CM | POA: Insufficient documentation

## 2010-12-01 DIAGNOSIS — B9689 Other specified bacterial agents as the cause of diseases classified elsewhere: Secondary | ICD-10-CM | POA: Insufficient documentation

## 2010-12-01 DIAGNOSIS — N760 Acute vaginitis: Secondary | ICD-10-CM | POA: Insufficient documentation

## 2010-12-01 DIAGNOSIS — R109 Unspecified abdominal pain: Secondary | ICD-10-CM | POA: Insufficient documentation

## 2010-12-01 DIAGNOSIS — N9489 Other specified conditions associated with female genital organs and menstrual cycle: Secondary | ICD-10-CM | POA: Insufficient documentation

## 2010-12-01 DIAGNOSIS — A499 Bacterial infection, unspecified: Secondary | ICD-10-CM | POA: Insufficient documentation

## 2010-12-01 LAB — URINALYSIS, ROUTINE W REFLEX MICROSCOPIC
Glucose, UA: NEGATIVE mg/dL
Hgb urine dipstick: NEGATIVE
Ketones, ur: NEGATIVE mg/dL
Leukocytes, UA: NEGATIVE
Protein, ur: NEGATIVE mg/dL
Urobilinogen, UA: 0.2 mg/dL (ref 0.0–1.0)

## 2010-12-01 LAB — BASIC METABOLIC PANEL
CO2: 26 mEq/L (ref 19–32)
Calcium: 10 mg/dL (ref 8.4–10.5)
Glucose, Bld: 108 mg/dL — ABNORMAL HIGH (ref 70–99)
Sodium: 140 mEq/L (ref 135–145)

## 2010-12-01 LAB — DIFFERENTIAL
Basophils Relative: 0 % (ref 0–1)
Eosinophils Relative: 1 % (ref 0–5)
Monocytes Absolute: 0.4 10*3/uL (ref 0.1–1.0)
Monocytes Relative: 7 % (ref 3–12)
Neutro Abs: 3.6 10*3/uL (ref 1.7–7.7)

## 2010-12-01 LAB — CBC
Hemoglobin: 13.5 g/dL (ref 12.0–15.0)
MCH: 29.2 pg (ref 26.0–34.0)
MCHC: 34.4 g/dL (ref 30.0–36.0)
RDW: 13.8 % (ref 11.5–15.5)

## 2010-12-01 LAB — WET PREP, GENITAL: Trich, Wet Prep: NONE SEEN

## 2010-12-01 MED ORDER — IOHEXOL 300 MG/ML  SOLN
100.0000 mL | Freq: Once | INTRAMUSCULAR | Status: AC | PRN
  Administered 2010-12-01: 100 mL via INTRAVENOUS

## 2010-12-01 MED ORDER — IOHEXOL 300 MG/ML  SOLN: 100.0000 mL | Freq: Once | INTRAMUSCULAR | Status: DC | PRN

## 2010-12-03 LAB — GC/CHLAMYDIA PROBE AMP, GENITAL
Chlamydia, DNA Probe: NEGATIVE
GC Probe Amp, Genital: NEGATIVE

## 2010-12-14 ENCOUNTER — Other Ambulatory Visit: Payer: Self-pay | Admitting: Family Medicine

## 2010-12-14 DIAGNOSIS — R1031 Right lower quadrant pain: Secondary | ICD-10-CM

## 2010-12-18 ENCOUNTER — Other Ambulatory Visit: Payer: Managed Care, Other (non HMO)

## 2010-12-28 ENCOUNTER — Ambulatory Visit (INDEPENDENT_AMBULATORY_CARE_PROVIDER_SITE_OTHER): Payer: Managed Care, Other (non HMO) | Admitting: Family Medicine

## 2010-12-28 ENCOUNTER — Encounter: Payer: Self-pay | Admitting: Family Medicine

## 2010-12-28 DIAGNOSIS — R112 Nausea with vomiting, unspecified: Secondary | ICD-10-CM

## 2010-12-28 DIAGNOSIS — R300 Dysuria: Secondary | ICD-10-CM

## 2010-12-28 DIAGNOSIS — N899 Noninflammatory disorder of vagina, unspecified: Secondary | ICD-10-CM

## 2010-12-28 DIAGNOSIS — N898 Other specified noninflammatory disorders of vagina: Secondary | ICD-10-CM

## 2010-12-28 LAB — POCT URINALYSIS DIPSTICK
Blood, UA: NEGATIVE
Glucose, UA: NEGATIVE
Nitrite, UA: NEGATIVE
Protein, UA: NEGATIVE
Urobilinogen, UA: 0.2
pH, UA: 7

## 2010-12-28 LAB — POCT WET PREP (WET MOUNT): Trichomonas Wet Prep HPF POC: NEGATIVE

## 2010-12-28 LAB — POCT UA - MICROSCOPIC ONLY

## 2010-12-28 MED ORDER — PROMETHAZINE HCL 12.5 MG PO TABS: 12.5000 mg | ORAL_TABLET | Freq: Four times a day (QID) | ORAL | Status: DC | PRN

## 2010-12-28 NOTE — Patient Instructions (Signed)
There's no obvious urine or vaginal infection, but we are going to send your urine and see if it grows anything. In the mean time, try to drink lots of fluids to stay hydrated.  This is most likely some sort of virus, but we will call you if anything grows in your urine and you need an antibiotic. I am sending in some nausea medicine to your pharmacy.  Please take it if you are still having nausea and are unable to keep any fluids down. Please call or come back if you start having fevers >101, increased vomiting, vomit that is bloody or bright green, diarrhea that is bloody, pain with urinating, pain on the sides of your back, or increased urinating.

## 2010-12-28 NOTE — Progress Notes (Signed)
S: Pt comes in today for nausea, vomiting, diarrhea.  Per pt, she began not feeling well including having a little bit of dizziness 2 days ago.  Yesterday morning, she woke up nauseated.  She ate some crackers and sprite and vomited x1 at work that morning. NBNB.  Also started having diarrhea yesterday morning, has had 2 episodes, non-bloody, no melena. Has also been having some vaginal itching and discharge, tho describes the discharge as thin and normal for her.  Is not having any dysuria, frequency, feelings of incomplete emptying, or incontinence.  However, pt is slightly concerned about having a UTI since she was very nauseated with her UTI last august.  No fevers or chills, no headaches, no congestion, cough, rhinorrhea.    ROS: Per HPI  History  Smoking status  . Never Smoker   Smokeless tobacco  . Not on file    O:  Filed Vitals:   12/28/10 0932  BP: 102/68  Pulse: 72  Temp: 98.8 F (37.1 C)    Gen: NAD, tired appearing HEENT: MMM, no pharyngeal erythema or exudate, no appreciable cervical LAD CV: RRR, no murmur Pulm: CTA bilat, no wheezes or crackles Abd: soft, non-tender, no suprapubic tenderness, no flank tenderness GU: normal female external genitalia, cervical os WNL, small amount of thin, white vaginal discharge; no lesions; no CMT, no adnexal masses   A/P: 30 y.o. female p/w nausea, general malaise -See problem list -f/u if symptoms do not improve; needs CPE with PCP

## 2010-12-28 NOTE — Assessment & Plan Note (Addendum)
Pt w/ dx of BV end of June in ED, now with some itching and thin discharge.  Wet prep showing many WBCs but no trich, clue cells, or yeast.  Likely physiologic discharge.

## 2010-12-28 NOTE — Assessment & Plan Note (Addendum)
LMP 2 weeks ago, pt certain she is not pregnant.  Only vomited x1.  Will send Rx for  phenergan in case pt continues being unable to eat/drink from nausea.  Pt recalled had first UTI 01/2010 and had significant nausea with that.  UA shows small LE, 3+ rods.  Without true UTI symptoms, will culture urine and hold before starting abx.  Informed pt we would call if culture became positive and she needed an abx called in.

## 2011-01-03 ENCOUNTER — Encounter (HOSPITAL_BASED_OUTPATIENT_CLINIC_OR_DEPARTMENT_OTHER): Payer: Self-pay | Admitting: *Deleted

## 2011-01-03 ENCOUNTER — Emergency Department (HOSPITAL_BASED_OUTPATIENT_CLINIC_OR_DEPARTMENT_OTHER)
Admission: EM | Admit: 2011-01-03 | Discharge: 2011-01-03 | Disposition: A | Discharge status: Home / Self Care | Payer: Managed Care, Other (non HMO) | Attending: Emergency Medicine | Admitting: Emergency Medicine

## 2011-01-03 DIAGNOSIS — R42 Dizziness and giddiness: Secondary | ICD-10-CM | POA: Insufficient documentation

## 2011-01-03 DIAGNOSIS — I10 Essential (primary) hypertension: Secondary | ICD-10-CM | POA: Insufficient documentation

## 2011-01-03 DIAGNOSIS — R1011 Right upper quadrant pain: Secondary | ICD-10-CM | POA: Insufficient documentation

## 2011-01-03 LAB — CBC
HCT: 36.9 % (ref 36.0–46.0)
Hemoglobin: 13 g/dL (ref 12.0–15.0)
MCHC: 35.2 g/dL (ref 30.0–36.0)
MCV: 85 fL (ref 78.0–100.0)
RDW: 13.8 % (ref 11.5–15.5)

## 2011-01-03 LAB — DIFFERENTIAL
Basophils Absolute: 0 10*3/uL (ref 0.0–0.1)
Basophils Relative: 0 % (ref 0–1)
Eosinophils Relative: 1 % (ref 0–5)
Lymphocytes Relative: 39 % (ref 12–46)
Monocytes Absolute: 0.5 10*3/uL (ref 0.1–1.0)
Monocytes Relative: 7 % (ref 3–12)

## 2011-01-03 LAB — URINALYSIS, ROUTINE W REFLEX MICROSCOPIC
Glucose, UA: NEGATIVE mg/dL
Ketones, ur: NEGATIVE mg/dL
Nitrite: NEGATIVE
Protein, ur: NEGATIVE mg/dL
Urobilinogen, UA: 0.2 mg/dL (ref 0.0–1.0)

## 2011-01-03 LAB — COMPREHENSIVE METABOLIC PANEL
AST: 20 U/L (ref 0–37)
BUN: 8 mg/dL (ref 6–23)
CO2: 23 mEq/L (ref 19–32)
Calcium: 9.8 mg/dL (ref 8.4–10.5)
Creatinine, Ser: 0.7 mg/dL (ref 0.50–1.10)
GFR calc non Af Amer: >60 mL/min (ref >60)
Total Bilirubin: 0.4 mg/dL (ref 0.3–1.2)

## 2011-01-03 LAB — PREGNANCY, URINE: Preg Test, Ur: NEGATIVE

## 2011-01-03 LAB — URINE MICROSCOPIC-ADD ON

## 2011-01-03 LAB — LIPASE, BLOOD: Lipase: 24 U/L (ref 11–59)

## 2011-01-03 MED ORDER — MECLIZINE HCL 25 MG PO TABS: 25.0000 mg | ORAL_TABLET | Freq: Three times a day (TID) | ORAL | Status: AC | PRN

## 2011-01-03 MED ORDER — OXYCODONE-ACETAMINOPHEN 5-325 MG PO TABS: 1.0000 | ORAL_TABLET | Freq: Four times a day (QID) | ORAL | Status: AC | PRN

## 2011-01-03 MED ORDER — ACETAMINOPHEN 325 MG PO TABS
650.0000 mg | ORAL_TABLET | Freq: Once | ORAL | Status: AC
  Administered 2011-01-03: 650 mg via ORAL
  Filled 2011-01-03: qty 2

## 2011-01-03 MED ORDER — MECLIZINE HCL 25 MG PO TABS: 25.0000 mg | ORAL_TABLET | Freq: Once | ORAL | Status: DC

## 2011-01-03 MED ORDER — ONDANSETRON 4 MG PO TBDP
4.0000 mg | ORAL_TABLET | Freq: Once | ORAL | Status: AC
  Administered 2011-01-03: 4 mg via ORAL
  Filled 2011-01-03: qty 1

## 2011-01-03 NOTE — ED Notes (Signed)
Right mid to upper abd pain for a while. Diarrhea and nausea. Has an appointment with a GI doctor on Monday. States she is being evaluated by her GYN and medical doctor for same.

## 2011-01-03 NOTE — ED Provider Notes (Addendum)
History     CSN: [id number] Arrival date & time: 01/03/2011  4:58 PM  Chief Complaint  Patient presents with  . Abdominal Pain   Patient is a 30 y.o. female presenting with abdominal pain. The history is provided by the patient.  Abdominal Pain The primary symptoms of the illness include abdominal pain.  Pt has had abdominal pain off and on for several weeks. Seen in the ED and by Ob/Gyn several times for same and has been referred to GI. She has not yet had [place] of her gallbladder. She has had moderate aching diffuse R sided pain today, similar to previous. She has also had a few episodes of room spinning dizziness with nausea and headache today as well. This is a new complaint for her. No particular position worsens. No fever, no vomiting.   Past Medical History  Diagnosis Date  . Hypertension     History reviewed. No pertinent past surgical history.  No family history on file.  History  Substance Use Topics  . Smoking status: Never Smoker   . Smokeless tobacco: Not on file  . Alcohol Use: No    OB History    Grav Para Term Preterm Abortions TAB SAB Ect Mult Living                  Review of Systems  Gastrointestinal: Positive for abdominal pain.  All other systems reviewed and are negative.    Physical Exam  BP 122/88  Pulse 77  Temp(Src) 99.2 F (37.3 C) (Oral)  Resp 20  SpO2 100%  LMP 12/15/2010  Physical Exam  Nursing note and vitals reviewed. Constitutional: She is oriented to person, place, and time. She appears well-developed and well-nourished.  HENT:  Head: Normocephalic and atraumatic.  Eyes: EOM are normal. Pupils are equal, round, and reactive to light.  Neck: Normal range of motion. Neck supple.  Cardiovascular: Normal rate, normal heart sounds and intact distal pulses.   Pulmonary/Chest: Effort normal and breath sounds normal.  Abdominal: Bowel sounds are normal. She exhibits no distension. There is tenderness (diffuse mild tenderness). There is  no rebound and no guarding.  Musculoskeletal: Normal range of motion. She exhibits no edema and no tenderness.  Neurological: She is alert and oriented to person, place, and time. No cranial nerve deficit.       Mild nystagmus with R lateral gaze, otherwise normal neuro exam  Skin: Skin is warm and dry. No rash noted.  Psychiatric: She has a normal mood and affect.    ED Course  Procedures  MDM Pt feeling better, labs unremarkable. She has GI appointment in a few days. Will defer further workup pending that evaluation. Her vertigo is seemingly unrelated to her abdominal pain. Nausea is improved. Will d/c with pain meds, antivert and followup as scheduled.      Miki Labuda B. Bernette Mayers, MD 01/03/11 1914  Bonnita Levan. Bernette Mayers, MD 02/01/11 [phone]

## 2011-01-03 NOTE — ED Notes (Signed)
Pt. Placed on Depo injections in July by Dr. Dimple Casey.  Last normal period was July  19th.

## 2011-01-03 NOTE — ED Notes (Signed)
Pt. States she is having nausea and has no ride back home.  To report to Dr. Bernette Mayers.

## 2011-01-03 NOTE — ED Notes (Signed)
Pt. Reports feeling better and less nausea and dizziness.

## 2011-01-03 NOTE — ED Notes (Signed)
Pt. Reports nausea today and real loose stools the last couple of days.

## 2011-01-07 ENCOUNTER — Other Ambulatory Visit: Payer: Self-pay | Admitting: Gastroenterology

## 2011-01-07 DIAGNOSIS — R1011 Right upper quadrant pain: Secondary | ICD-10-CM

## 2011-01-08 ENCOUNTER — Other Ambulatory Visit: Payer: Managed Care, Other (non HMO)

## 2011-01-15 ENCOUNTER — Ambulatory Visit
Admission: RE | Admit: 2011-01-15 | Discharge: 2011-01-15 | Disposition: A | Discharge status: Home / Self Care | Payer: Managed Care, Other (non HMO) | Source: Ambulatory Visit | Attending: Gastroenterology | Admitting: Gastroenterology

## 2011-01-15 DIAGNOSIS — R1011 Right upper quadrant pain: Secondary | ICD-10-CM

## 2011-02-05 ENCOUNTER — Telehealth: Payer: Self-pay | Admitting: Family Medicine

## 2011-02-05 NOTE — Telephone Encounter (Signed)
Ms. Jenna Mcclure is having pain since she had a Diagnostic Laparoscopy last Wednesday done by her GYN.  I told her she would need to talk to her GYN but she said the pain is not related to her surgery and she wants to talk to a nurse.

## 2011-02-05 NOTE — Telephone Encounter (Signed)
Left message on voicemail for patient to call back. 

## 2011-02-06 ENCOUNTER — Emergency Department (HOSPITAL_BASED_OUTPATIENT_CLINIC_OR_DEPARTMENT_OTHER)
Admission: EM | Admit: 2011-02-06 | Discharge: 2011-02-06 | Disposition: A | Discharge status: Home / Self Care | Payer: Managed Care, Other (non HMO) | Attending: Emergency Medicine | Admitting: Emergency Medicine

## 2011-02-06 ENCOUNTER — Ambulatory Visit: Payer: Managed Care, Other (non HMO) | Admitting: Family Medicine

## 2011-02-06 ENCOUNTER — Encounter (HOSPITAL_BASED_OUTPATIENT_CLINIC_OR_DEPARTMENT_OTHER): Payer: Self-pay | Admitting: Family Medicine

## 2011-02-06 DIAGNOSIS — X58XXXA Exposure to other specified factors, initial encounter: Secondary | ICD-10-CM | POA: Insufficient documentation

## 2011-02-06 DIAGNOSIS — M62838 Other muscle spasm: Secondary | ICD-10-CM | POA: Insufficient documentation

## 2011-02-06 DIAGNOSIS — R1011 Right upper quadrant pain: Secondary | ICD-10-CM | POA: Insufficient documentation

## 2011-02-06 DIAGNOSIS — S335XXA Sprain of ligaments of lumbar spine, initial encounter: Secondary | ICD-10-CM | POA: Insufficient documentation

## 2011-02-06 DIAGNOSIS — M25559 Pain in unspecified hip: Secondary | ICD-10-CM | POA: Insufficient documentation

## 2011-02-06 DIAGNOSIS — S39012A Strain of muscle, fascia and tendon of lower back, initial encounter: Secondary | ICD-10-CM

## 2011-02-06 DIAGNOSIS — R110 Nausea: Secondary | ICD-10-CM | POA: Insufficient documentation

## 2011-02-06 LAB — URINALYSIS, ROUTINE W REFLEX MICROSCOPIC
Glucose, UA: NEGATIVE mg/dL
Ketones, ur: NEGATIVE mg/dL
Leukocytes, UA: NEGATIVE
Nitrite: NEGATIVE
Protein, ur: NEGATIVE mg/dL
Urobilinogen, UA: 1 mg/dL (ref 0.0–1.0)

## 2011-02-06 LAB — CBC
HCT: 38.2 % (ref 36.0–46.0)
Hemoglobin: 13.2 g/dL (ref 12.0–15.0)
MCH: 29.5 pg (ref 26.0–34.0)
MCHC: 34.6 g/dL (ref 30.0–36.0)
MCV: 85.5 fL (ref 78.0–100.0)
RDW: 13.5 % (ref 11.5–15.5)

## 2011-02-06 LAB — COMPREHENSIVE METABOLIC PANEL
Albumin: 4.2 g/dL (ref 3.5–5.2)
Alkaline Phosphatase: 57 U/L (ref 39–117)
BUN: 7 mg/dL (ref 6–23)
Calcium: 9.8 mg/dL (ref 8.4–10.5)
Creatinine, Ser: 0.6 mg/dL (ref 0.50–1.10)
GFR calc Af Amer: >60 mL/min (ref >60)
Glucose, Bld: 103 mg/dL — ABNORMAL HIGH (ref 70–99)
Potassium: 3.8 mEq/L (ref 3.5–5.1)
Total Protein: 7.8 g/dL (ref 6.0–8.3)

## 2011-02-06 LAB — URINE MICROSCOPIC-ADD ON

## 2011-02-06 LAB — LIPASE, BLOOD: Lipase: 31 U/L (ref 11–59)

## 2011-02-06 MED ORDER — ONDANSETRON 8 MG PO TBDP
8.0000 mg | ORAL_TABLET | Freq: Once | ORAL | Status: AC
  Administered 2011-02-06: 8 mg via ORAL
  Filled 2011-02-06: qty 1

## 2011-02-06 MED ORDER — CYCLOBENZAPRINE HCL 10 MG PO TABS: 10.0000 mg | ORAL_TABLET | Freq: Two times a day (BID) | ORAL | Status: DC | PRN

## 2011-02-06 MED ORDER — ONDANSETRON 8 MG PO TBDP: 8.0000 mg | ORAL_TABLET | Freq: Three times a day (TID) | ORAL | Status: DC | PRN

## 2011-02-06 NOTE — ED Provider Notes (Signed)
History     CSN: [id number] Arrival date & time: 02/06/2011  9:50 AM  Chief Complaint  Patient presents with  . Hip Pain   HPI Comments: The patient is a 30 year old female who presents with an magnetic abdominal pain, right-sided, with right lower back pain and nausea. Symptoms started in June of 2012 with pain in the stomach and pelvis for which she sought an emergency physician and was diagnosed with an ovarian cyst she had at that time a CT scan that was otherwise negative and possible diagnosis of endometriosis was given. She then followed up with her OB/GYN who performed an ultrasound and evaluation decided that this was not endometriosis, and put the patient on the Depo-Provera shot beginning July 20 to prevent further ovarian cysts. 3 weeks ago, the patient reported abdominal pain on the right side and saw a gastroenterologist as an outpatient for this. He performed a ultrasound of the abdomen showing a normal gallbladder and no obvious acute findings. After that she went back to her OB/GYN who not knowing what else could be the problem, performed a diagnostic laparoscopy approximately one week ago which was negative for acute findings. Approximately 3 weeks ago she also began to have back pain and right hip pain for which she saw an orthopedist as an outpatient and had x-rays performed which showed normal lumbar spine and normal hip. The orthopedist gave her no diagnosis. Since then she has been using hydrocodone for pain at home. She presents today with right lower back pain, right sided abdominal discomfort, and nausea. On examination, she has an apparent muscle spasm at the right lumbar region. I do feel that this is responsible for the low back pain, but had concerns about whether she could have any acute findings at this time of the gallbladder or liver, or if she may have a urinary tract infection. Short of these things however, I am uncertain of the cause of her discomfort.  Patient is a 30  y.o. female presenting with abdominal pain.  Abdominal Pain The primary symptoms of the illness include abdominal pain, nausea and diarrhea. The primary symptoms of the illness do not include fever, fatigue, shortness of breath, vomiting, hematemesis, hematochezia, dysuria, vaginal discharge or vaginal bleeding. Episode onset: Approximately one month ago. The onset of the illness was gradual. The problem has been gradually worsening.  Onset: Approximately one month ago. The pain came on gradually. The abdominal pain has been gradually worsening since its onset. The abdominal pain is located in the RUQ and right flank. The abdominal pain does not radiate. The severity of the abdominal pain is 5/10. The abdominal pain is relieved by nothing. The abdominal pain is exacerbated by eating.  Nausea began more than 1 week ago. The nausea is associated with eating. The nausea is exacerbated by food.  The diarrhea began more than 1 week ago. The diarrhea is watery and semi-solid. The diarrhea occurs 2 to 4 times per day.   The patient states that she believes she is currently not pregnant. The patient has had a change in bowel habit. Risk factors for an acute abdominal problem include a history of abdominal surgery. Additional symptoms associated with the illness include back pain. Symptoms associated with the illness do not include chills, anorexia, diaphoresis, heartburn, constipation, urgency, hematuria or frequency.    Past Medical History  Diagnosis Date  . Hypertension   . Bilateral ovarian cysts     Past Surgical History  Procedure Date  . Laparoscopy  No family history on file.  History  Substance Use Topics  . Smoking status: Never Smoker   . Smokeless tobacco: Not on file  . Alcohol Use: No    OB History    Grav Para Term Preterm Abortions TAB SAB Ect Mult Living                  Review of Systems  Constitutional: Negative for fever, chills, diaphoresis and fatigue.  HENT:  Negative.   Eyes: Negative.   Respiratory: Negative for shortness of breath.   Cardiovascular: Negative for chest pain.  Gastrointestinal: Positive for nausea, abdominal pain and diarrhea. Negative for heartburn, vomiting, constipation, blood in stool, hematochezia, abdominal distention, anal bleeding, anorexia and hematemesis.  Genitourinary: Negative for dysuria, urgency, frequency, hematuria, vaginal bleeding and vaginal discharge.  Musculoskeletal: Positive for back pain.  Skin: Negative for color change, pallor, rash and wound.  Neurological: Negative for dizziness, weakness, light-headedness and headaches.  Psychiatric/Behavioral: Negative.     Physical Exam  BP 151/90  Pulse 97  Temp(Src) 98.5 F (36.9 C) (Oral)  Resp 18  Ht 5\' 2"  (1.575 m)  Wt 135 lb 9 oz (61.491 kg)  BMI 24.79 kg/m2  SpO2 100%  LMP 01/31/2011  Physical Exam  Nursing note and vitals reviewed. Constitutional: She is oriented to person, place, and time. She appears well-developed and well-nourished. No distress.  HENT:  Head: Normocephalic and atraumatic.  Nose: Nose normal.  Mouth/Throat: Oropharynx is clear and moist.  Eyes: EOM are normal. Pupils are equal, round, and reactive to light. No scleral icterus.  Neck: Normal range of motion. Neck supple. No JVD present.  Cardiovascular: Normal rate, regular rhythm, normal heart sounds and intact distal pulses.   Pulmonary/Chest: Effort normal and breath sounds normal. No respiratory distress. She has no wheezes. She has no rales. She exhibits no tenderness.  Abdominal: Soft. Bowel sounds are normal. She exhibits no distension and no mass. There is no tenderness. There is no rebound and no guarding.       The patient reports pain to the right upper quadrant, however there is no tenderness here. There is no tenderness elicited anywhere on the abdomen on palpation.  Musculoskeletal: Normal range of motion. She exhibits no edema and no tenderness.       Lumbar  back: She exhibits tenderness, swelling, pain and spasm. She exhibits normal range of motion, no bony tenderness, no edema, no deformity and no laceration.       Back:  Neurological: She is alert and oriented to person, place, and time. She has normal reflexes. No cranial nerve deficit. Coordination normal.  Skin: Skin is warm and dry. No rash noted. She is not diaphoretic. No erythema. No pallor.  Psychiatric: She has a normal mood and affect. Her behavior is normal. Judgment and thought content normal.    ED Course  Procedures  MDM Biliary colic, cholecystitis, cholelithiasis, urinary tract infection, irritable bowel syndrome      Felisa Bonier, MD 02/06/11 1141

## 2011-02-06 NOTE — ED Notes (Signed)
Pt c/o right hip, right upper leg and right low back pain x 3 weeks. Pt sts she has been "worked up for abdominal pain, had an exploratory laparoscopy a week ago and has also had negative xray for same pain I'm having today".

## 2011-02-12 ENCOUNTER — Encounter: Payer: Self-pay | Admitting: Family Medicine

## 2011-02-12 ENCOUNTER — Ambulatory Visit (INDEPENDENT_AMBULATORY_CARE_PROVIDER_SITE_OTHER): Payer: Managed Care, Other (non HMO) | Admitting: Family Medicine

## 2011-02-12 VITALS — BP 130/80 | HR 80 | Wt 136.7 lb

## 2011-02-12 DIAGNOSIS — G894 Chronic pain syndrome: Secondary | ICD-10-CM

## 2011-02-12 MED ORDER — DULOXETINE HCL 20 MG PO CPEP: 20.0000 mg | ORAL_CAPSULE | Freq: Every day | ORAL | Status: DC

## 2011-02-12 NOTE — Progress Notes (Signed)
  Subjective:    Patient ID: Jenna Mcclure, female    DOB: 10/20/1980, 30 y.o.   MRN: [id number]  HPI This young lady comes in today as a follow up from the ER.  She describes chronic pain for almost one year.  Gives a history of GYN [facility]) visits from November 2011 to July 2012, that included a comprehensive work up with CT scans, and even a exploratory lap(August 2012).  She reports an ovarian cyst but no other pathology. GYN referred to GI, and that work up with multiple imaging studies was all negative. She developed back pain after the lap and went to ortho ([facility]) and had an MRI last week, she does not have the results (not available in the computer). She also went to the ER last week and was told to follow up here. (cannot find an ER visit, must have gone to another system) She is currently taking hydrocodone and flexeril.    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  She when asked is afraid that she has cancer.  Her father died of leukemia in his 61s.  She has had a lot of blood work with no abnormalities.   I discussed at length that narcotics are not indicated in chronic pain.  Review of Systems  Constitutional: Positive for unexpected weight change. Negative for fever, chills, activity change, appetite change and fatigue.  Respiratory: Negative for cough and shortness of breath.   Cardiovascular: Negative for chest pain.  Gastrointestinal: Positive for abdominal pain. Negative for constipation and abdominal distention.  Genitourinary: Negative for dysuria.  Musculoskeletal: Positive for back pain.  Psychiatric/Behavioral: Positive for dysphoric mood.       Objective:   Physical Exam  Constitutional: She appears well-developed and well-nourished.   Spent so much time in history, not knowing patient (see HPI), did not complete full exam, she has seen multiple specialists in the past year, with an ER and orthopedic visit in the past 2 weeks.  Musculoskeletal:       Moved freely, sat with legs crossed          Assessment & Plan:

## 2011-02-12 NOTE — Patient Instructions (Addendum)
Google: neuropathic pain, chronic pain syndrome, pathways for pain, neurotransmission and pain; vitamin D  Return to see your doctor in one month Cymbalta take in the morning I will call you in the AM

## 2011-02-12 NOTE — Assessment & Plan Note (Addendum)
Spend over 40 minutes with patient reviewing her history,she was not previously known to me.  Will plan to start Cymbalta for chronic pain, she was not pleased initially but with explanation she agreed to a trial.  She will return to see her primary MD ([place], who has not seen her) in one month.  I encouraged her to read about Nonmalignant Chronic Pain. Will get CRP and Sed rate for reassurances as cancer is her fear. Also a vitamin D. She does have depression on her problem list from at a minimum of 2 years ago based on Dr. Kathrene Bongo tenure.  Recommend a PHQ9 at next visit.  Weight loss is only 15 pounds in one year, BMI is 25 today, ws 30 two years ago, do not see this as abnormal at this point.

## 2011-02-13 ENCOUNTER — Encounter: Payer: Self-pay | Admitting: Family Medicine

## 2011-02-13 LAB — VITAMIN D 25 HYDROXY (VIT D DEFICIENCY, FRACTURES): Vit D, 25-Hydroxy: 23 ng/mL — ABNORMAL LOW (ref 30–89)

## 2011-02-13 LAB — C-REACTIVE PROTEIN: CRP: 0.08 mg/dL (ref <0.60)

## 2011-02-21 LAB — GC/CHLAMYDIA PROBE AMP, GENITAL
Chlamydia, DNA Probe: NEGATIVE
GC Probe Amp, Genital: NEGATIVE

## 2011-02-21 LAB — POCT URINALYSIS DIP (DEVICE)
Nitrite: NEGATIVE
Operator id: 239701
Protein, ur: NEGATIVE
Specific Gravity, Urine: 1.025
Urobilinogen, UA: 1

## 2011-02-21 LAB — WET PREP, GENITAL: Yeast Wet Prep HPF POC: NONE SEEN

## 2011-02-21 LAB — POCT PREGNANCY, URINE: Preg Test, Ur: NEGATIVE

## 2011-02-26 LAB — DIFFERENTIAL
Lymphocytes Relative: 22
Lymphs Abs: 1.9
Neutrophils Relative %: 71

## 2011-02-26 LAB — URINALYSIS, ROUTINE W REFLEX MICROSCOPIC
Glucose, UA: NEGATIVE
Hgb urine dipstick: NEGATIVE
Specific Gravity, Urine: 1.024
Urobilinogen, UA: 1

## 2011-02-26 LAB — POCT I-STAT, CHEM 8
BUN: 8
Chloride: 104
Creatinine, Ser: 1
Potassium: 3.7
Sodium: 139

## 2011-02-26 LAB — CBC
Platelets: 279
WBC: 8.7

## 2011-02-26 LAB — POCT PREGNANCY, URINE: Operator id: 272551

## 2011-02-28 ENCOUNTER — Telehealth: Payer: Self-pay | Admitting: Family Medicine

## 2011-02-28 NOTE — Telephone Encounter (Signed)
Returned call.  No answer so left VMM to call us back.

## 2011-02-28 NOTE — Telephone Encounter (Signed)
Jenna Mcclure returned [place] call.

## 2011-02-28 NOTE — Telephone Encounter (Signed)
Patient states she has a GYN that started her on depo 12/21/2010. She has been bleeding for past month. Not heavy bleeding but constant like a regular period. She has called GYN office and is told that this is not unusual . She is very frustrated and calls to ask  our opinion. Consulted with Dr. Sheffield Slider and he advises patient can come in here to discuss with  MD and check HGB. Offered appointment tomorrow but she cannot come until next Tuesday. Appointment scheduled.

## 2011-02-28 NOTE — Telephone Encounter (Signed)
Needs to talk to nurse about problems she is having.

## 2011-03-05 ENCOUNTER — Ambulatory Visit: Payer: Managed Care, Other (non HMO) | Admitting: Family Medicine

## 2011-03-19 ENCOUNTER — Ambulatory Visit (INDEPENDENT_AMBULATORY_CARE_PROVIDER_SITE_OTHER): Payer: Managed Care, Other (non HMO) | Admitting: Family Medicine

## 2011-03-19 ENCOUNTER — Encounter: Payer: Self-pay | Admitting: Family Medicine

## 2011-03-19 VITALS — BP 131/80 | HR 90 | Temp 98.4°F | Ht 63.0 in | Wt 134.0 lb

## 2011-03-19 DIAGNOSIS — K219 Gastro-esophageal reflux disease without esophagitis: Secondary | ICD-10-CM

## 2011-03-19 DIAGNOSIS — G894 Chronic pain syndrome: Secondary | ICD-10-CM

## 2011-03-19 DIAGNOSIS — Z0000 Encounter for general adult medical examination without abnormal findings: Secondary | ICD-10-CM

## 2011-03-19 DIAGNOSIS — R5383 Other fatigue: Secondary | ICD-10-CM | POA: Insufficient documentation

## 2011-03-19 MED ORDER — OMEPRAZOLE 20 MG PO CPDR: 20.0000 mg | DELAYED_RELEASE_CAPSULE | Freq: Every day | ORAL | Status: DC

## 2011-03-19 NOTE — Assessment & Plan Note (Signed)
Pain in abdomen and right leg most concerning to her today. Abdominal pain has improved with Cymbalta, but leg pain continues. For now, we will continue Cymbalta. She has only been using it one month at 20mg . She states she is ok with the plan to continue, then return to clinic for a follow-up. At that time, we can discuss increasing the dose. For her leg pain, I encouraged her to continue exercising and stretching. She can take Tylenol as needed for the pain. No red flag symptoms.

## 2011-03-19 NOTE — Assessment & Plan Note (Signed)
Patient has "lump in throat" and is complaining of decreased energy, heart palpitations and weight loss. She does not have a TSH that I can see. We will check this today to rule out thyroid as a cause. If this is abnormal, I will call her. Otherwise, I will send a letter with results.

## 2011-03-19 NOTE — Assessment & Plan Note (Signed)
Does not need pap smear at this time. Will need next one in 2014 per new ACOG guidelines. Pt will get flu shot when she brings her other children for theirs. Does not need random glucose testing due to no risk factors.

## 2011-03-19 NOTE — Assessment & Plan Note (Signed)
Diagnosed by ENT last year when she was evaluated for difficulty swallowing. She has not been on any medications since then. I feel like the lump in her throat may be related to GERD. Will prescribe Prilosec daily, which will hopefully help with reflux. Will re-evaluate at next visit.

## 2011-03-19 NOTE — Progress Notes (Signed)
Subjective:    Patient ID: Jenna Mcclure, female    DOB: 06-02-1981, 30 y.o.   MRN: [id number]  HPI Pt is here today for a physical exam as well as a follow-up 1. Physical Exam: Pt's last pap smear was in 2011. She has a history of abnormal pap in 2004. At that time she had a colposcopy which was normal. Since then she has had multiple normal Pap smears. Given the new ACOG recommendation she is not due for a Pap smear today her last was in 2001 and had 3 consecutive normal smears. Of note, pt did state her husband had one genital wart which he states has been there for "a long time." We will need to keep that in mind for her future pap smears, but she does not have lesions at this time and I will not do testing today. 2. Abdominal Pain: Seen by gyn, ED, GI, and Luretha Murphy for this pain. At her last visit with Darl Pikes, she was started on Cymbalta which she states has helped. At this time, she has no abdominal pain and no concerns.  3. Leg pain: Right leg pain for a few months. Comes and goes, in unpredicted location but always in her right leg. Nothing makes it better or worse. Nothing gives her the pain, it comes on randomly. Denies any trauma or back pain. Some tingling, but no weakness or numbness.  4. "Lump in throat": Pt was seen by ENT last year for this same pain. They started her on Aciphex for GERD, which she took for a short period of time. Now she complains that it is difficult to swallow and she feels like something is in her throat. A friend of hers told her it may be her thyroid. She does endorse weight loss, fatigue, palpitations. Never had TSH in the past.    Review of Systems  Constitutional: Positive for unexpected weight change. Negative for fever, chills, activity change and appetite change.  HENT: Positive for neck pain. Negative for congestion and neck stiffness.   Respiratory: Negative for cough and shortness of breath.   Cardiovascular: Negative for chest pain.    Gastrointestinal: Negative for abdominal pain, diarrhea and constipation.  Genitourinary: Negative for difficulty urinating.  Musculoskeletal: Positive for myalgias and arthralgias. Negative for back pain and gait problem.  Skin: Negative for rash.  Neurological: Negative for dizziness.  All other systems reviewed and are negative.       Objective:   Physical Exam  Vitals reviewed. Constitutional: She is oriented to person, place, and time. She appears well-developed and well-nourished. No distress.  HENT:  Head: Normocephalic and atraumatic.  Right Ear: External ear normal.  Left Ear: External ear normal.  Nose: Nose normal.  Mouth/Throat: Oropharynx is clear and moist. No oropharyngeal exudate.  Eyes: Pupils are equal, round, and reactive to light.  Neck: Normal range of motion. Neck supple. No thyromegaly present.  Cardiovascular: Normal rate and regular rhythm.  Exam reveals no gallop and no friction rub.   No murmur heard. Pulmonary/Chest: Effort normal and breath sounds normal. She has no wheezes.  Abdominal: Soft. Bowel sounds are normal. She exhibits no distension. There is no tenderness.  Musculoskeletal: Normal range of motion. She exhibits no edema and no tenderness.       Strength/sensation of lower extremities equal bilaterally. Negative straight leg raise. Full ROM. No focal tenderness  Lymphadenopathy:    She has no cervical adenopathy.  Neurological: She is alert and oriented to person, place,  and time. She has normal reflexes. No cranial nerve deficit. Coordination normal.  Skin: Skin is warm and dry. No rash noted. She is not diaphoretic.  Psychiatric: She has a normal mood and affect. Her behavior is normal.          Assessment & Plan:

## 2011-03-19 NOTE — Patient Instructions (Addendum)
It was nice to meet you today!  I sent a prescription for [facility] to [facility]. Please take this daily. Also, I will call you if your thyroid test comes back abnormal. Otherwise, I will send you a letter. Please make an appointment to follow up with me in a month or two for your pain and we will discuss contraception at that time as well.  Please get your flu shot when you bring the other kids in to get theirs.  Raynard Mapps M. Chrisma Hurlock, M.D.

## 2011-03-20 ENCOUNTER — Telehealth: Payer: Self-pay | Admitting: Family Medicine

## 2011-03-20 ENCOUNTER — Encounter: Payer: Self-pay | Admitting: Family Medicine

## 2011-03-20 LAB — TSH: TSH: 1.939 u[IU]/mL (ref 0.350–4.500)

## 2011-03-20 NOTE — Telephone Encounter (Signed)
Received call from Ms. Quickel that she continues to feel fatigued and was wondering the results of her thyroid tests.  Also feels like her arm and leg are weak.  Symptoms of weakness and fatigue have been off and on for the past month.  No headache, slurred speech, vision changes, numbness or tingling.  She is able to ambulate normally despite feeling weak.  Advised her that her thyroid testing was normal.  Does have HTN as risk factor for stroke although given that these symptoms have been ongoing for quite sometime I do not believe this is a stroke at this time.  Advised if worsening, or develops difficulty talking or vision changes she should come in, otherwise i think it is reasonable to f/u with her PCP for further evaluation of her chronic fatigue and weakness.

## 2011-03-21 ENCOUNTER — Other Ambulatory Visit: Payer: Self-pay | Admitting: Family Medicine

## 2011-03-21 ENCOUNTER — Telehealth: Payer: Self-pay | Admitting: Family Medicine

## 2011-03-21 MED ORDER — OMEPRAZOLE 20 MG PO CPDR: 40.0000 mg | DELAYED_RELEASE_CAPSULE | Freq: Every day | ORAL | Status: DC

## 2011-03-21 NOTE — Progress Notes (Signed)
Insurance will not cover Prilosec 20mg . Reordered Prilosec 40mg  and sent to [facility]. Pt called, no answer. Left message.  Mychael Smock M. Keonna Raether, M.D.

## 2011-03-21 NOTE — Telephone Encounter (Signed)
Jenna Mcclure could not fill her rx because the mg for the Prilosec is 20mg  and that's sold OTC.  In order for her to get the rx form need to call pharmacy and have rx changed to 40 mg.  Call [facility] on [address]

## 2011-03-26 ENCOUNTER — Encounter: Payer: Self-pay | Admitting: Family Medicine

## 2011-03-26 ENCOUNTER — Ambulatory Visit (INDEPENDENT_AMBULATORY_CARE_PROVIDER_SITE_OTHER): Payer: Managed Care, Other (non HMO) | Admitting: Family Medicine

## 2011-03-26 VITALS — BP 120/80 | HR 79 | Temp 98.2°F | Ht 63.0 in | Wt 137.1 lb

## 2011-03-26 DIAGNOSIS — N9089 Other specified noninflammatory disorders of vulva and perineum: Secondary | ICD-10-CM

## 2011-03-26 DIAGNOSIS — N760 Acute vaginitis: Secondary | ICD-10-CM

## 2011-03-26 LAB — POCT WET PREP (WET MOUNT): Clue Cells Wet Prep HPF POC: NEGATIVE

## 2011-03-26 NOTE — Assessment & Plan Note (Signed)
Unclear exact etiology of irritation. Likely secondary to shaving. She states she last shaved on Thursday. She chronically wears a pad secondary to chronic dysfunctional uterine bleeding. This is likely inhibiting healing of her labia. No red flags her warning signs of some exam and history. Recommended to use moisturizing creams and if possible decrease pad use to help heal. Otherwise reassured patient this is nothing contagious. Followup in about 2 weeks if no improvement or sooner if worsening or no improvement.

## 2011-03-26 NOTE — Progress Notes (Signed)
  Subjective:    Patient ID: Jenna Mcclure, female    DOB: November 23, 1980, 30 y.o.   MRN: [id number]  HPI 1.  Rash: Patient noticed some itching in labial area that began yesterday morning.  When she looked in the mirror she noticed a rash on her labia. She is concerned because she recently noticed that her husband has a wart on his penis. She wants to make sure she has not contracted anything. She denies any dysuria, urinary hesitancy, no urinary frequency, vaginal discharge. She has chronic dysfunctional uterine bleeding secondary to fluid administration per patient. Has never had a rash like this before in the past. Has had yeast infections in the past. Itching persists.   Review of Systems See HPI above for review of systems.       Objective:   Physical Exam  Gen:  Alert, cooperative patient who appears stated age in no acute distress.  Vital signs reviewed. GYN:  External genitalia with with erythema and mild edema BL labia majora.  No pubic hair, appears shaved.  No folliculitis.  No condyloma, molluscum, herpetic lesions or other lesions noted labia.  Speculum exam performed, vaginal mucosa pink, moist, normal rugae.  Nonfriable cervix without lesions, no discharge noted on speculum exam.  Some old blood noted in posterior fornix.    Skin - no sores or suspicious lesions or rashes or color changes except as noted above       Assessment & Plan:

## 2011-04-01 ENCOUNTER — Telehealth: Payer: Self-pay | Admitting: Family Medicine

## 2011-04-01 NOTE — Telephone Encounter (Signed)
Was here last week and was tested for vag prob - was told there was nothing wrong but states she is getting worse and wants to talk to nurse for advise.

## 2011-04-02 NOTE — Telephone Encounter (Signed)
Spoke with pt, she is on her way to Urgent Care now.  Wants additional testing Jenna Mcclure, Jenna Mcclure

## 2011-04-02 NOTE — Telephone Encounter (Signed)
Ms. Savino is calling back.  She was in last week and the problem is getting worse.

## 2011-04-04 ENCOUNTER — Ambulatory Visit: Payer: Managed Care, Other (non HMO) | Admitting: Family Medicine

## 2011-04-18 ENCOUNTER — Ambulatory Visit (INDEPENDENT_AMBULATORY_CARE_PROVIDER_SITE_OTHER): Payer: Managed Care, Other (non HMO) | Admitting: Family Medicine

## 2011-04-18 ENCOUNTER — Encounter: Payer: Self-pay | Admitting: Family Medicine

## 2011-04-18 DIAGNOSIS — K219 Gastro-esophageal reflux disease without esophagitis: Secondary | ICD-10-CM

## 2011-04-18 DIAGNOSIS — R21 Rash and other nonspecific skin eruption: Secondary | ICD-10-CM | POA: Insufficient documentation

## 2011-04-18 NOTE — Assessment & Plan Note (Signed)
Contact dermatitis likely from sticker.  Does not bother her, is healing.  Advised no further tx needed.

## 2011-04-18 NOTE — Assessment & Plan Note (Signed)
Advised BID prilosec OTC and dietary modification.  She will keep log of symptoms and will bring to return if no improvement.

## 2011-04-18 NOTE — Patient Instructions (Signed)
I think the rash is due to something that touched your skin and caused a reaction.  It will take time for the discoloration to fade.  Try taking Prilosec twice per day.  See things to help with acid reflux. ------------------------------------------------------------------ Gastroesophageal Reflux Disease, Adult Gastroesophageal reflux disease (GERD) happens when acid from your stomach flows up into the esophagus. When acid comes in contact with the esophagus, the acid causes soreness (inflammation) in the esophagus. Over time, GERD may create small holes (ulcers) in the lining of the esophagus. CAUSES    Increased body weight. This puts pressure on the stomach, making acid rise from the stomach into the esophagus.     Smoking. This increases acid production in the stomach.     Drinking alcohol. This causes decreased pressure in the lower esophageal sphincter (valve or ring of muscle between the esophagus and stomach), allowing acid from the stomach into the esophagus.     Late evening meals and a full stomach. This increases pressure and acid production in the stomach.     A malformed lower esophageal sphincter.  Sometimes, no cause is found. SYMPTOMS    Burning pain in the lower part of the mid-chest behind the breastbone and in the mid-stomach area. This may occur twice a week or more often.     Trouble swallowing.     Sore throat.     Dry cough.     Asthma-like symptoms including chest tightness, shortness of breath, or wheezing.  DIAGNOSIS   Your caregiver may be able to diagnose GERD based on your symptoms. In some cases, X-rays and other tests may be done to check for complications or to check the condition of your stomach and esophagus. TREATMENT   Your caregiver may recommend over-the-counter or prescription medicines to help decrease acid production. Ask your caregiver before starting or adding any new medicines.   HOME CARE INSTRUCTIONS    Change the factors that you can  control. Ask your caregiver for guidance concerning weight loss, quitting smoking, and alcohol consumption.     Avoid foods and drinks that make your symptoms worse, such as:     Caffeine or alcoholic drinks.     Chocolate.    Peppermint or mint flavorings.     Garlic and onions.     Spicy foods.     Citrus fruits, such as oranges, lemons, or limes.     Tomato-based foods such as sauce, chili, salsa, and pizza.     Fried and fatty foods.     Avoid lying down for the 3 hours prior to your bedtime or prior to taking a nap.     Eat small, frequent meals instead of large meals.     Wear loose-fitting clothing. Do not wear anything tight around your waist that causes pressure on your stomach.     Raise the head of your bed 6 to 8 inches with wood blocks to help you sleep. Extra pillows will not help.     Only take over-the-counter or prescription medicines for pain, discomfort, or fever as directed by your caregiver.     Do not take aspirin, ibuprofen, or other nonsteroidal anti-inflammatory drugs (NSAIDs).  SEEK IMMEDIATE MEDICAL CARE IF:    You have pain in your arms, neck, jaw, teeth, or back.     Your pain increases or changes in intensity or duration.     You develop nausea, vomiting, or sweating (diaphoresis).     You develop shortness of breath, or  you faint.     Your vomit is green, yellow, black, or looks like coffee grounds or blood.     Your stool is red, bloody, or black.  These symptoms could be signs of other problems, such as heart disease, gastric bleeding, or esophageal bleeding. MAKE SURE YOU:    Understand these instructions.     Will watch your condition.     Will get help right away if you are not doing well or get worse.  Document Released: 02/27/2005 Document Revised: 01/30/2011 Document Reviewed: 12/07/2010 [facility] Patient Information 2012 [place], [place].

## 2011-04-18 NOTE — Progress Notes (Signed)
  Subjective:    Patient ID: Jenna Mcclure, female    DOB: 07/18/1980, 30 y.o.   MRN: [id number]  HPI 4 days of chest tightness taking a deep breath, intermittent.  Worse several hours after eating.  Lasts for about an hour at a time.  Better when sitting up.  Has been taking Prilosec for 2-3 days with no improvement.  Better with burping- feels like she constantly has to burp.  Non exertional.  Rash:  5 days ago, left breast.  LMP unsure because coming off depo.  Pain initially, now resolved, now rash there.  No new soaps or detergent. On further discussion, she did have a voting sticker on that area.     Review of Systems Gen:  No fever, chills Throat:  No sour taste, sore thraot CV:  No  dyspnea on exertion Resp: No dyspnea Abd: No nausea, vomting, diarrhea, constipation SKIN: + rash on chest       Objective:   Physical Exam GEN: Alert & Oriented, No acute distress CV:  Regular Rate & Rhythm, no murmur Respiratory:  Normal work of breathing, CTAB Abd:  + BS, soft, no tenderness to palpation Breast:  Left breast, chest wall- flat, hyperpigmented, approx 2x3 cm patch of discoloration suspicious for contact dermatitis given very discrete lines.  No erythema or raised borders.  No breast lumps, skins abnormalities.       Assessment & Plan:

## 2011-04-22 ENCOUNTER — Other Ambulatory Visit: Payer: Self-pay

## 2011-04-22 ENCOUNTER — Emergency Department (HOSPITAL_BASED_OUTPATIENT_CLINIC_OR_DEPARTMENT_OTHER)
Admission: EM | Admit: 2011-04-22 | Discharge: 2011-04-23 | Disposition: A | Discharge status: Home / Self Care | Payer: Managed Care, Other (non HMO) | Attending: Emergency Medicine | Admitting: Emergency Medicine

## 2011-04-22 ENCOUNTER — Encounter (HOSPITAL_BASED_OUTPATIENT_CLINIC_OR_DEPARTMENT_OTHER): Payer: Self-pay | Admitting: *Deleted

## 2011-04-22 DIAGNOSIS — K219 Gastro-esophageal reflux disease without esophagitis: Secondary | ICD-10-CM | POA: Insufficient documentation

## 2011-04-22 DIAGNOSIS — I10 Essential (primary) hypertension: Secondary | ICD-10-CM | POA: Insufficient documentation

## 2011-04-22 DIAGNOSIS — R0602 Shortness of breath: Secondary | ICD-10-CM | POA: Insufficient documentation

## 2011-04-22 DIAGNOSIS — R079 Chest pain, unspecified: Secondary | ICD-10-CM | POA: Insufficient documentation

## 2011-04-22 DIAGNOSIS — Z79899 Other long term (current) drug therapy: Secondary | ICD-10-CM | POA: Insufficient documentation

## 2011-04-22 NOTE — ED Notes (Signed)
C/o epigastric pain x 1 week. Was seen by PMD last week and started on prilosec.

## 2011-04-23 MED ORDER — GI COCKTAIL ~~LOC~~
30.0000 mL | Freq: Once | ORAL | Status: AC
  Administered 2011-04-23: 30 mL via ORAL
  Filled 2011-04-23: qty 30

## 2011-04-23 MED ORDER — FAMOTIDINE 20 MG PO TABS
20.0000 mg | ORAL_TABLET | Freq: Once | ORAL | Status: AC
  Administered 2011-04-23: 20 mg via ORAL
  Filled 2011-04-23: qty 1

## 2011-04-23 NOTE — ED Provider Notes (Signed)
History  This chart was scribed for Jenna Gaskins, MD by Bennett Scrape. This patient was seen in room MH01/MH01 and the patient's care was started at 12:00PM.  CSN: [id number] Arrival date & time: 04/22/2011 11:35 PM   First MD Initiated Contact with Patient 04/22/11 2351      Chief Complaint  Patient presents with  . Gastrophageal Reflux     Patient is a 30 y.o. female presenting with GERD. The history is provided by the patient. No language interpreter was used.  Gastrophageal Reflux This is a chronic problem. The current episode started more than 1 week ago. The problem occurs constantly. The problem has been gradually worsening. Associated symptoms include chest pain (described as tightness) and shortness of breath. Pertinent negatives include no abdominal pain. Exacerbated by: laying down. The symptoms are relieved by nothing.    Jenna Mcclure is a 30 y.o. female who presents to the Emergency Department complaining of 8 hours of gradually worsening, constant GERD Sx with associated lower chest pain described as tightness and fullness that radiates to her lower neck and upper back areas and occasional SOB.  Reports she has "fluid" in the back of her throat when the pain occurs.  Pt states that lying flat aggravates the GERD Sx and that walking or eating food neither improves or aggravates GERD Sx. Pt states that she was prescribed Prilosec by PMD 6 days ago with mild improvement in Sx. Pt denies fever, vomiting, diaphoresis, leg swelling and abdominal pain. Pt denies any relevant medical problems such as MI, stroke or PE. Pt denies smoking.  Past Medical History  Diagnosis Date  . Hypertension   . Bilateral ovarian cysts     Past Surgical History  Procedure Date  . Laparoscopy     History reviewed. No pertinent family history.  History  Substance Use Topics  . Smoking status: Never Smoker   . Smokeless tobacco: Not on file  . Alcohol Use: No    Review of Systems    Constitutional: Negative for fever and diaphoresis.  Respiratory: Positive for shortness of breath.   Cardiovascular: Positive for chest pain (described as tightness). Negative for leg swelling.  Gastrointestinal: Negative for vomiting and abdominal pain.  Musculoskeletal: Positive for back pain.    Allergies  Review of patient's allergies indicates no known allergies.  Home Medications   Current Outpatient Rx  Name Route Sig Dispense Refill  . CYCLOBENZAPRINE HCL 10 MG PO TABS Oral Take 10 mg by mouth 3 (three) times daily as needed.      . CYCLOBENZAPRINE HCL 10 MG PO TABS Oral Take 1 tablet (10 mg total) by mouth 2 (two) times daily as needed for muscle spasms. 10 tablet 0  . DULOXETINE HCL 20 MG PO CPEP Oral Take 1 capsule (20 mg total) by mouth daily. 30 capsule 6  . IBUPROFEN 600 MG PO TABS Oral Take 600 mg by mouth every 6 (six) hours as needed. Take for 5 days     . MEDROXYPROGESTERONE ACETATE 150 MG/ML IM SUSP Intramuscular Inject 150 mg into the muscle every 3 (three) months.      . ONE-DAILY MULTI VITAMINS PO TABS Oral Take 1 tablet by mouth daily.      . NEBIVOLOL HCL 5 MG PO TABS Oral Take 5 mg by mouth daily.      Marland Kitchen OMEPRAZOLE 20 MG PO CPDR Oral Take 2 capsules (40 mg total) by mouth daily. 30 capsule 5    BP 118/71  Pulse 80  Temp(Src) 98.1 F (36.7 C) (Oral)  Resp 16  Ht 5\' 2"  (1.575 m)  Wt 140 lb (63.504 kg)  BMI 25.61 kg/m2  SpO2 100%  Physical Exam  CONSTITUTIONAL: Well developed/well nourished HEAD AND FACE: Normocephalic/atraumatic EYES: EOMI/PERRL ENMT: Mucous membranes moist NECK: supple no meningeal signs SPINE:entire spine nontender CV: S1/S2 noted, soft mumur noted (chronic per patient) LUNGS: Lungs are clear to auscultation bilaterally, no apparent distress ABDOMEN: soft, nontender, no rebound or guarding NEURO: Pt is awake/alert, moves all extremitiesx4 EXTREMITIES: pulses normal, full ROM SKIN: warm, color normal PSYCH: no abnormalities  of mood noted  ED Course  Procedures   DIAGNOSTIC STUDIES: Oxygen Saturation is 100% on room air, normal by my interpretation.    COORDINATION OF CARE: 12:06PM-Discussed GERD medicine and treatment with patient at bedside and patient agreed to plan. 12:41PM- Pt rechecked. She is much improved and requesting discharge I advised outpatient followup  Pt did not want CXR performed on this visit PERC negative She is well appearing    MDM  Nursing notes reviewed and considered in documentation   Date: 04/23/2011  Rate: [id number]  Rhythm: normal sinus rhythm  QRS Axis: normal  Intervals: normal  ST/T Wave abnormalities: normal  Conduction Disutrbances:none  Narrative Interpretation:   Old EKG Reviewed: unchanged        I personally performed the services described in this documentation, which was scribed in my presence. The recorded information has been reviewed and considered.     Jenna Gaskins, MD 04/23/11 [phone]

## 2011-04-23 NOTE — ED Notes (Signed)
MD at bedside. 

## 2011-05-08 ENCOUNTER — Telehealth: Payer: Self-pay | Admitting: Family Medicine

## 2011-05-08 ENCOUNTER — Encounter: Payer: Self-pay | Admitting: Family Medicine

## 2011-05-08 ENCOUNTER — Ambulatory Visit (INDEPENDENT_AMBULATORY_CARE_PROVIDER_SITE_OTHER): Payer: Managed Care, Other (non HMO) | Admitting: Family Medicine

## 2011-05-08 DIAGNOSIS — J069 Acute upper respiratory infection, unspecified: Secondary | ICD-10-CM | POA: Insufficient documentation

## 2011-05-08 NOTE — Telephone Encounter (Signed)
Calling the emergency line today. Having body aches and headaches.  No significant fever or dyspnea. Her son was diagnosed with the flu.  I advised that at this time her symptoms are constant with the flu. Symptoms present >48 hours therefore tamiflu not indicated. Red flag signs and symptoms reviewed. Jenna Mcclure expresses understanding.

## 2011-05-08 NOTE — Assessment & Plan Note (Signed)
Symptoms consistent with a viral upper respiratory tract infection. Although her son likely has influenza her symptoms at this time or not consistent with this diagnosis. Additionally her symptoms have been present for 3-5 days therefore Tamiflu would not be helpful. Plan to recommend increasing dose of ibuprofen for symptom management rest and usual care. Will followup if worsening or not improving. Red flags reviewed with patient who expresses understanding

## 2011-05-08 NOTE — Patient Instructions (Signed)
Thank you for coming in today. You may have the flu too, but I am not sure.  Take the medicine (cold medicine) 1 pill + 1-2 ibuprofen every 6 hours as needed.  Stay hydrated and take care of your kid. If you have trouble breathing let me know.

## 2011-05-08 NOTE — Progress Notes (Signed)
Ms. Murcia presents to clinic today with headache nasal congestion and sinus pain for 3-5 days. She denies any fevers chills dyspnea body aches. Her son was just diagnosed with influenza-like illness.  She's taking Sudafed/ibuprofen as recommended by a pharmacist. This does help a bit but tends to wear off soon.  PMH reviewed.  ROS as above otherwise neg Medications reviewed. Current Outpatient Prescriptions  Medication Sig Dispense Refill  . cyclobenzaprine (FLEXERIL) 10 MG tablet Take 10 mg by mouth 3 (three) times daily as needed.        . cyclobenzaprine (FLEXERIL) 10 MG tablet Take 1 tablet (10 mg total) by mouth 2 (two) times daily as needed for muscle spasms.  10 tablet  0  . DULoxetine (CYMBALTA) 20 MG capsule Take 1 capsule (20 mg total) by mouth daily.  30 capsule  6  . ibuprofen (ADVIL,MOTRIN) 600 MG tablet Take 600 mg by mouth every 6 (six) hours as needed. Take for 5 days       . medroxyPROGESTERone (DEPO-PROVERA) 150 MG/ML injection Inject 150 mg into the muscle every 3 (three) months.        . Multiple Vitamin (MULTIVITAMIN) tablet Take 1 tablet by mouth daily.        . nebivolol (BYSTOLIC) 5 MG tablet Take 5 mg by mouth daily.        Marland Kitchen omeprazole (PRILOSEC) 20 MG capsule Take 2 capsules (40 mg total) by mouth daily.  30 capsule  5    Exam:  Temp(Src) 98.3 F (36.8 C) (Oral)  Ht 5' 2.5" (1.588 m)  Wt 140 lb (63.504 kg)  BMI 25.20 kg/m2 Gen: Well NAD HEENT: EOMI,  MMM, red nasal turbinates. Mildly tender to percussion along frontal sinuses and maxillary sinuses Lungs: CTABL Nl WOB Heart: RRR no MRG Abd: NABS, NT, ND Exts: Non edematous BL  LE, warm and well perfused.

## 2011-05-09 ENCOUNTER — Ambulatory Visit (INDEPENDENT_AMBULATORY_CARE_PROVIDER_SITE_OTHER): Payer: Managed Care, Other (non HMO)

## 2011-05-09 DIAGNOSIS — B9789 Other viral agents as the cause of diseases classified elsewhere: Secondary | ICD-10-CM

## 2011-05-17 ENCOUNTER — Ambulatory Visit (INDEPENDENT_AMBULATORY_CARE_PROVIDER_SITE_OTHER): Payer: Managed Care, Other (non HMO)

## 2011-05-17 DIAGNOSIS — R071 Chest pain on breathing: Secondary | ICD-10-CM

## 2011-05-17 DIAGNOSIS — J301 Allergic rhinitis due to pollen: Secondary | ICD-10-CM

## 2011-06-11 ENCOUNTER — Encounter: Payer: Self-pay | Admitting: Family Medicine

## 2011-06-11 ENCOUNTER — Ambulatory Visit (INDEPENDENT_AMBULATORY_CARE_PROVIDER_SITE_OTHER): Payer: Managed Care, Other (non HMO) | Admitting: Family Medicine

## 2011-06-11 DIAGNOSIS — K219 Gastro-esophageal reflux disease without esophagitis: Secondary | ICD-10-CM

## 2011-06-11 DIAGNOSIS — J329 Chronic sinusitis, unspecified: Secondary | ICD-10-CM | POA: Insufficient documentation

## 2011-06-11 MED ORDER — AMOXICILLIN 500 MG PO CAPS: 500.0000 mg | ORAL_CAPSULE | Freq: Three times a day (TID) | ORAL | Status: DC

## 2011-06-11 MED ORDER — FLUCONAZOLE 150 MG PO TABS: 150.0000 mg | ORAL_TABLET | Freq: Once | ORAL | Status: DC

## 2011-06-11 NOTE — Assessment & Plan Note (Addendum)
Taking carafate and prn levsin for chest pain prescribed by urgent care.  This suggests GERD vs IBS etiology.  Advised no change in regimen today as she is doing well, and asked patient to follow-up with PCP for further evaluation.  Stressed importance of continuity of care for multiple somatic complaints, in order to make progress on diagnosis and treatment,

## 2011-06-11 NOTE — Patient Instructions (Addendum)
Make follow-up with PCP in 2-3 weeks or next convenience  Sinusitis Sinuses are air pockets within the bones of your face. The growth of bacteria within a sinus leads to infection. The infection prevents the sinuses from draining. This infection is called sinusitis. SYMPTOMS   There will be different areas of pain depending on which sinuses have become infected.  The maxillary sinuses often produce pain beneath the eyes.     Frontal sinusitis may cause pain in the middle of the forehead and above the eyes.  Other problems (symptoms) include:  Toothaches.     Colored, pus-like (purulent) drainage from the nose.     Swelling, warmth, and tenderness over the sinus areas may be signs of infection.  TREATMENT   Sinusitis is most often determined by an exam.X-rays may be taken. If x-rays have been taken, make sure you obtain your results or find out how you are to obtain them. Your caregiver may give you medications (antibiotics). These are medications that will help kill the bacteria causing the infection. You may also be given a medication (decongestant) that helps to reduce sinus swelling.   HOME CARE INSTRUCTIONS    Only take over-the-counter or prescription medicines for pain, discomfort, or fever as directed by your caregiver.     Drink extra fluids. Fluids help thin the mucus so your sinuses can drain more easily.     Applying either moist heat or ice packs to the sinus areas may help relieve discomfort.     Use saline nasal sprays to help moisten your sinuses. The sprays can be found at your local drugstore.  SEEK IMMEDIATE MEDICAL CARE IF:  You have a fever.     You have increasing pain, severe headaches, or toothache.     You have nausea, vomiting, or drowsiness.     You develop unusual swelling around the face or trouble seeing.  MAKE SURE YOU:    Understand these instructions.     Will watch your condition.     Will get help right away if you are not doing well or get  worse.  Document Released: 05/20/2005 Document Revised: 01/30/2011 Document Reviewed: 12/17/2006 [facility] Patient Information 2012 [place], [place].

## 2011-06-11 NOTE — Progress Notes (Signed)
  Subjective:    Patient ID: Jenna Mcclure, female    DOB: 05-16-1981, 30 y.o.   MRN: [id number]  HPI Here for work in appt for facial congestion and sinus pressure  Had URI early December.  Seemed to get better, and in the past 3 weeks has noted continued frontal sinus pressure.  Has continued chronic dizziness and nausea of 6 months in duration.  No fever, chills.  No rhinorrhea but feels congested.  No history of frequent sinusitis.  Chestpain:  Went to UC for evaluation of chest pain.  Was taking omeprazole for GERd, did nto help.  Now fully resolved with taking carafate, was prescribed hyoscyamien which she takes prn and feels it also helps.  I have reviewed patient's  PMH, FH, and Social history and Medications as related to this visit. Non smoker.  Review of Systems See above    Objective:   Physical Exam GEN: Alert & Oriented, No acute distress HEENT: Ethan/AT. EOMI, PERRLA, no conjunctival injection or scleral icterus.  Bilateral tympanic membranes intact without erythema or effusion.  .  Nares without edema or rhinorrhea.  Oropharynx is without erythema or exudates.  No anterior or posterior cervical lymphadenopathy.  Tender to palpation over frontal sinus CV:  Regular Rate & Rhythm, no murmur Respiratory:  Normal work of breathing, CTAB Abd:  + BS, soft, no tenderness to palpation Ext: no pre-tibial edema        Assessment & Plan:

## 2011-06-11 NOTE — Assessment & Plan Note (Signed)
Amoxicillin given 3 weeks induration, second sickening.  Advised to follow-up with PCP for resolution of dizziness and nausea.

## 2011-06-14 ENCOUNTER — Telehealth: Payer: Self-pay | Admitting: Family Medicine

## 2011-06-14 NOTE — Telephone Encounter (Signed)
Spoke with Dr. Armen Pickup, reviewed her meds and BP readings.  Dr. Armen Pickup ok'd any OTC decongestant, but to just take it the shortest amount of time possible.  Straight to pts VM, LM informing her of this. Fleeger, Maryjo Rochester

## 2011-06-14 NOTE — Telephone Encounter (Signed)
Call Jenna Mcclure back to tell her what she can take otc for sinus pressure.  She didn't want to take anything with the other meds she's on without checking first.

## 2011-06-14 NOTE — Telephone Encounter (Signed)
Discussed continued sinus pressure. She has been on pseudoephedrine for 3 weeks.  No fever, chills, night sweats, weight loss. This is likely rebound from stopping the pseudoephedrine. She will schedule an appointment to be seen on Monday.

## 2011-06-21 ENCOUNTER — Encounter (HOSPITAL_COMMUNITY): Payer: Self-pay | Admitting: *Deleted

## 2011-06-21 ENCOUNTER — Emergency Department (HOSPITAL_COMMUNITY)
Admission: EM | Admit: 2011-06-21 | Discharge: 2011-06-21 | Disposition: A | Discharge status: Home / Self Care | Payer: Managed Care, Other (non HMO) | Attending: Emergency Medicine | Admitting: Emergency Medicine

## 2011-06-21 DIAGNOSIS — M546 Pain in thoracic spine: Secondary | ICD-10-CM | POA: Insufficient documentation

## 2011-06-21 DIAGNOSIS — J329 Chronic sinusitis, unspecified: Secondary | ICD-10-CM | POA: Insufficient documentation

## 2011-06-21 DIAGNOSIS — Z79899 Other long term (current) drug therapy: Secondary | ICD-10-CM | POA: Insufficient documentation

## 2011-06-21 DIAGNOSIS — J3489 Other specified disorders of nose and nasal sinuses: Secondary | ICD-10-CM | POA: Insufficient documentation

## 2011-06-21 DIAGNOSIS — R51 Headache: Secondary | ICD-10-CM | POA: Insufficient documentation

## 2011-06-21 DIAGNOSIS — R42 Dizziness and giddiness: Secondary | ICD-10-CM | POA: Insufficient documentation

## 2011-06-21 DIAGNOSIS — I10 Essential (primary) hypertension: Secondary | ICD-10-CM | POA: Insufficient documentation

## 2011-06-21 MED ORDER — IPRATROPIUM BROMIDE 0.06 % NA SOLN
2.0000 | Freq: Three times a day (TID) | NASAL | Status: DC
  Administered 2011-06-21: 2 via NASAL
  Filled 2011-06-21: qty 15

## 2011-06-21 MED ORDER — IPRATROPIUM BROMIDE 0.03 % NA SOLN: 2.0000 | Freq: Three times a day (TID) | NASAL | Status: DC

## 2011-06-21 NOTE — ED Provider Notes (Signed)
Medical screening examination/treatment/procedure(s) were performed by non-physician practitioner and as supervising physician I was immediately available for consultation/collaboration.   Daivon Rayos, MD 06/21/11 2345 

## 2011-06-21 NOTE — ED Provider Notes (Signed)
History     CSN: [id number]  Arrival date & time 06/21/11  2143   First MD Initiated Contact with Patient 06/21/11 2205      Chief Complaint  Patient presents with  . Headache    (Consider location/radiation/quality/duration/timing/severity/associated sxs/prior treatment) HPI Comments: Patient has had sinusitis for the last, month.  She's been on a course of antibiotic.  She is taking a decongestant, which was Sudafed without any relief.  Presents to the emergency department today with continued frontal right-sided headache, worse with leaning forward.  Palpation, she, says she's not resting at night. She reports some relief of the congestion while in the shower, but this is temporary. She has also developed a pain between her shoulder blades.  That is worse when she lays down, better when she sits up, denies shortness of breath, radiation of the pain  Patient is a 31 y.o. female presenting with headaches. The history is provided by the patient.  Headache  The current episode started more than 1 week ago. The problem occurs constantly. The pain is located in the right unilateral and frontal region. The quality of the pain is described as dull and throbbing. The pain is at a severity of 4/10. The pain is moderate. The pain does not radiate. Pertinent negatives include no fever, no shortness of breath and no nausea.    Past Medical History  Diagnosis Date  . Hypertension   . Bilateral ovarian cysts     Past Surgical History  Procedure Date  . Laparoscopy     History reviewed. No pertinent family history.  History  Substance Use Topics  . Smoking status: Never Smoker   . Smokeless tobacco: Not on file  . Alcohol Use: No    OB History    Grav Para Term Preterm Abortions TAB SAB Ect Mult Living                  Review of Systems  Constitutional: Negative for fever and chills.  HENT: Positive for congestion and sinus pressure. Negative for ear pain, sore throat,  rhinorrhea, trouble swallowing, postnasal drip and ear discharge.   Eyes: Negative for visual disturbance.  Respiratory: Negative for cough and shortness of breath.   Gastrointestinal: Negative for nausea.  Musculoskeletal: Negative for myalgias.  Neurological: Positive for dizziness and headaches. Negative for weakness.    Allergies  Review of patient's allergies indicates no known allergies.  Home Medications   Current Outpatient Rx  Name Route Sig Dispense Refill  . DULOXETINE HCL 20 MG PO CPEP Oral Take 20 mg by mouth daily.    Marland Kitchen FLUCONAZOLE 150 MG PO TABS Oral Take 150 mg by mouth once. Take when antibiotics complete    . IBUPROFEN 200 MG PO TABS Oral Take 200-400 mg by mouth every 6 (six) hours as needed. For pain    . ONE-DAILY MULTI VITAMINS PO TABS Oral Take 1 tablet by mouth daily.      . SUDAFED PO Oral Take 1 tablet by mouth once.    . SUCRALFATE 1 GM/10ML PO SUSP Oral Take 1 g by mouth 3 (three) times daily.      . CYCLOBENZAPRINE HCL 10 MG PO TABS Oral Take 10 mg by mouth 2 (two) times daily as needed. For muscle spasms.      BP 133/78  Pulse 94  Temp(Src) 98.1 F (36.7 C) (Oral)  Resp 18  SpO2 100%  LMP 05/21/2011  Physical Exam  Constitutional: She is oriented to person,  place, and time. She appears well-developed and well-nourished.  HENT:  Head: Normocephalic.    Right Ear: External ear normal.  Left Ear: External ear normal.  Nose: Nose normal.  Mouth/Throat: Oropharynx is clear and moist. No oropharyngeal exudate.  Neck: Normal range of motion.  Cardiovascular: Normal rate.   Pulmonary/Chest: Effort normal. No respiratory distress. She exhibits no tenderness.  Abdominal: Soft.  Musculoskeletal: Normal range of motion.  Neurological: She is alert and oriented to person, place, and time.  Skin: Skin is warm and dry.  Psychiatric: She has a normal mood and affect.    ED Course  Procedures (including critical care time)  Labs Reviewed - No data  to display No results found.   1. Sinusitis       MDM  Sinus congestion, muscle strain        Arman Filter, NP 06/21/11 2253  Arman Filter, NP 06/21/11 [id number]

## 2011-06-21 NOTE — ED Notes (Signed)
Pt c/o sinus congestion, nausea and dizziness x one month. Tx with sudafed and amoxicillin without improvement. Pt alert and in no acute distress. Converses with ease. Ambulated to tx rm with slow steady gait.

## 2011-06-21 NOTE — ED Notes (Signed)
The pt has had head pressure for one month and she cannot get rid of her headache.Marland Kitchen

## 2011-06-25 ENCOUNTER — Other Ambulatory Visit: Payer: Self-pay | Admitting: Gastroenterology

## 2011-06-25 DIAGNOSIS — R110 Nausea: Secondary | ICD-10-CM

## 2011-06-25 DIAGNOSIS — R1011 Right upper quadrant pain: Secondary | ICD-10-CM

## 2011-06-26 ENCOUNTER — Ambulatory Visit (INDEPENDENT_AMBULATORY_CARE_PROVIDER_SITE_OTHER): Payer: Managed Care, Other (non HMO)

## 2011-06-26 DIAGNOSIS — R51 Headache: Secondary | ICD-10-CM

## 2011-06-26 DIAGNOSIS — R5381 Other malaise: Secondary | ICD-10-CM

## 2011-06-26 DIAGNOSIS — R42 Dizziness and giddiness: Secondary | ICD-10-CM

## 2011-07-16 ENCOUNTER — Encounter (HOSPITAL_BASED_OUTPATIENT_CLINIC_OR_DEPARTMENT_OTHER): Payer: Self-pay | Admitting: *Deleted

## 2011-07-16 ENCOUNTER — Other Ambulatory Visit (HOSPITAL_COMMUNITY): Payer: Managed Care, Other (non HMO)

## 2011-07-16 ENCOUNTER — Emergency Department (HOSPITAL_BASED_OUTPATIENT_CLINIC_OR_DEPARTMENT_OTHER)
Admission: EM | Admit: 2011-07-16 | Discharge: 2011-07-16 | Disposition: A | Discharge status: Home / Self Care | Payer: Managed Care, Other (non HMO) | Attending: Emergency Medicine | Admitting: Emergency Medicine

## 2011-07-16 DIAGNOSIS — O2690 Pregnancy related conditions, unspecified, unspecified trimester: Secondary | ICD-10-CM | POA: Insufficient documentation

## 2011-07-16 DIAGNOSIS — I10 Essential (primary) hypertension: Secondary | ICD-10-CM | POA: Insufficient documentation

## 2011-07-16 DIAGNOSIS — Z331 Pregnant state, incidental: Secondary | ICD-10-CM

## 2011-07-16 DIAGNOSIS — R1013 Epigastric pain: Secondary | ICD-10-CM | POA: Insufficient documentation

## 2011-07-16 LAB — COMPREHENSIVE METABOLIC PANEL
ALT: 8 U/L (ref 0–35)
AST: 13 U/L (ref 0–37)
CO2: 23 mEq/L (ref 19–32)
Calcium: 9.2 mg/dL (ref 8.4–10.5)
Chloride: 104 mEq/L (ref 96–112)
Creatinine, Ser: 0.7 mg/dL (ref 0.50–1.10)
GFR calc Af Amer: >90 mL/min (ref >90)
GFR calc non Af Amer: >90 mL/min (ref >90)
Glucose, Bld: 88 mg/dL (ref 70–99)
Sodium: 137 mEq/L (ref 135–145)
Total Bilirubin: 0.3 mg/dL (ref 0.3–1.2)

## 2011-07-16 LAB — DIFFERENTIAL
Eosinophils Relative: 0 % (ref 0–5)
Lymphocytes Relative: 22 % (ref 12–46)
Lymphs Abs: 1.8 10*3/uL (ref 0.7–4.0)
Monocytes Absolute: 0.4 10*3/uL (ref 0.1–1.0)
Neutro Abs: 5.8 10*3/uL (ref 1.7–7.7)

## 2011-07-16 LAB — URINALYSIS, ROUTINE W REFLEX MICROSCOPIC
Glucose, UA: NEGATIVE mg/dL
Hgb urine dipstick: NEGATIVE
Ketones, ur: NEGATIVE mg/dL
Protein, ur: NEGATIVE mg/dL
Urobilinogen, UA: 0.2 mg/dL (ref 0.0–1.0)

## 2011-07-16 LAB — CBC
HCT: 34.6 % — ABNORMAL LOW (ref 36.0–46.0)
MCV: 85.4 fL (ref 78.0–100.0)
RBC: 4.05 MIL/uL (ref 3.87–5.11)
WBC: 7.9 10*3/uL (ref 4.0–10.5)

## 2011-07-16 LAB — PREGNANCY, URINE: Preg Test, Ur: POSITIVE — AB

## 2011-07-16 MED ORDER — GI COCKTAIL ~~LOC~~
30.0000 mL | Freq: Once | ORAL | Status: AC
  Administered 2011-07-16: 30 mL via ORAL
  Filled 2011-07-16: qty 30

## 2011-07-16 MED ORDER — SUCRALFATE 1 G PO TABS: 1.0000 g | ORAL_TABLET | Freq: Four times a day (QID) | ORAL | Status: DC

## 2011-07-16 MED ORDER — ONDANSETRON HCL 4 MG/2ML IJ SOLN
4.0000 mg | Freq: Once | INTRAMUSCULAR | Status: AC
  Administered 2011-07-16: 4 mg via INTRAVENOUS
  Filled 2011-07-16: qty 2

## 2011-07-16 MED ORDER — SODIUM CHLORIDE 0.9 % IV SOLN
Freq: Once | INTRAVENOUS | Status: AC
  Administered 2011-07-16: 10:00:00 via INTRAVENOUS

## 2011-07-16 MED ORDER — PRENATAL COMPLETE 14-0.4 MG PO TABS: 1.0000 | ORAL_TABLET | ORAL | Status: DC

## 2011-07-16 MED ORDER — METOCLOPRAMIDE HCL 10 MG PO TABS: 10.0000 mg | ORAL_TABLET | Freq: Four times a day (QID) | ORAL | Status: AC | PRN

## 2011-07-16 NOTE — Discharge Instructions (Signed)
See your doctor to repeat the pregnancy test for confirmation. In the meantime, do not take your birth control pills. Use other forms of birth control, such as condoms, in the meantime.  Abdominal Pain Abdominal pain can be caused by many things. Your caregiver decides the seriousness of your pain by an examination and possibly blood tests and X-rays. Many cases can be observed and treated at home. Most abdominal pain is not caused by a disease and will probably improve without treatment. However, in many cases, more time must pass before a clear cause of the pain can be found. Before that point, it may not be known if you need more testing, or if hospitalization or surgery is needed. HOME CARE INSTRUCTIONS   Do not take laxatives unless directed by your caregiver.   Take pain medicine only as directed by your caregiver.   Only take over-the-counter or prescription medicines for pain, discomfort, or fever as directed by your caregiver.   Try a clear liquid diet (broth, tea, or water) for as long as directed by your caregiver. Slowly move to a bland diet as tolerated.  SEEK IMMEDIATE MEDICAL CARE IF:   The pain does not go away.   You have a fever.   You keep throwing up (vomiting).   The pain is felt only in portions of the abdomen. Pain in the right side could possibly be appendicitis. In an adult, pain in the left lower portion of the abdomen could be colitis or diverticulitis.   You pass bloody or black tarry stools.  MAKE SURE YOU:   Understand these instructions.   Will watch your condition.   Will get help right away if you are not doing well or get worse.  Document Released: 02/27/2005 Document Revised: 01/30/2011 Document Reviewed: 01/06/2008 [facility] Patient Information 2012 [place], [place].  Metoclopramide tablets What is this medicine? METOCLOPRAMIDE (met oh kloe PRA mide) is used to treat the symptoms of gastroesophageal reflux disease (GERD) like heartburn. It is also  used to treat people with slow emptying of the stomach and intestinal tract. This medicine may be used for other purposes; ask your health care provider or pharmacist if you have questions. What should I tell my health care provider before I take this medicine? They need to know if you have any of these conditions: -breast cancer -depression -diabetes -heart failure -high blood pressure -kidney disease -liver disease -Parkinson's disease or a movement disorder -pheochromocytoma -seizures -stomach obstruction, bleeding, or perforation -an unusual or allergic reaction to metoclopramide, procainamide, sulfites, other medicines, foods, dyes, or preservatives -pregnant or trying to get pregnant -breast-feeding How should I use this medicine? Take this medicine by mouth with a glass of water. Follow the directions on the prescription label. Take this medicine on an empty stomach, about 30 minutes before eating. Take your doses at regular intervals. Do not take your medicine more often than directed. Do not stop taking except on the advice of your doctor or health care professional. A special MedGuide will be given to you by the pharmacist with each prescription and refill. Be sure to read this information carefully each time. Talk to your pediatrician regarding the use of this medicine in children. Special care may be needed. Overdosage: If you think you have taken too much of this medicine contact a poison control center or emergency room at once. NOTE: This medicine is only for you. Do not share this medicine with others. What if I miss a dose? If you miss a  dose, take it as soon as you can. If it is almost time for your next dose, take only that dose. Do not take double or extra doses. What may interact with this medicine? -acetaminophen -cyclosporine -digoxin -medicines for blood pressure -medicines for diabetes, including insulin -medicines for hay fever and other allergies -medicines  for depression, especially an Monoamine Oxidase Inhibitor (MAOI) -medicines for Parkinson's disease, like levodopa -medicines for sleep or for pain -tetracycline This list may not describe all possible interactions. Give your health care provider a list of all the medicines, herbs, non-prescription drugs, or dietary supplements you use. Also tell them if you smoke, drink alcohol, or use illegal drugs. Some items may interact with your medicine. What should I watch for while using this medicine? It may take a few weeks for your stomach condition to start to get better. However, do not take this medicine for longer than 12 weeks. The longer you take this medicine, and the more you take it, the greater your chances are of developing serious side effects. If you are an elderly patient, a female patient, or you have diabetes, you may be at an increased risk for side effects from this medicine. Contact your doctor immediately if you start having movements you cannot control such as lip smacking, rapid movements of the tongue, involuntary or uncontrollable movements of the eyes, head, arms and legs, or muscle twitches and spasms. Patients and their families should watch out for worsening depression or thoughts of suicide. Also watch out for any sudden or severe changes in feelings such as feeling anxious, agitated, panicky, irritable, hostile, aggressive, impulsive, severely restless, overly excited and hyperactive, or not being able to sleep. If this happens, especially at the beginning of treatment or after a change in dose, call your doctor. Do not treat yourself for high fever. Ask your doctor or health care professional for advice. You may get drowsy or dizzy. Do not drive, use machinery, or do anything that needs mental alertness until you know how this drug affects you. Do not stand or sit up quickly, especially if you are an older patient. This reduces the risk of dizzy or fainting spells. Alcohol can make  you more drowsy and dizzy. Avoid alcoholic drinks. What side effects may I notice from receiving this medicine? Side effects that you should report to your doctor or health care professional as soon as possible: -allergic reactions like skin rash, itching or hives, swelling of the face, lips, or tongue -abnormal production of milk in females -breast enlargement in both males and females -change in the way you walk -difficulty moving, speaking or swallowing -drooling, lip smacking, or rapid movements of the tongue -excessive sweating -fever -involuntary or uncontrollable movements of the eyes, head, arms and legs -irregular heartbeat or palpitations -muscle twitches and spasms -unusually weak or tired Side effects that usually do not require medical attention (report to your doctor or health care professional if they continue or are bothersome): -change in sex drive or performance -depressed mood -diarrhea -difficulty sleeping -headache -menstrual changes -restless or nervous This list may not describe all possible side effects. Call your doctor for medical advice about side effects. You may report side effects to FDA at 1-800-FDA-1088. Where should I keep my medicine? Keep out of the reach of children. Store at room temperature between 20 and 25 degrees C (68 and 77 degrees F). Protect from light. Keep container tightly closed. Throw away any unused medicine after the expiration date. NOTE: This sheet is  a summary. It may not cover all possible information. If you have questions about this medicine, talk to your doctor, pharmacist, or health care provider.  2012, [employer] Standard. (01/13/2008 4:30:05 PM)  Sucralfate tablets What is this medicine? SUCRALFATE (SOO kral fate) helps to treat ulcers of the intestine. This medicine may be used for other purposes; ask your health care provider or pharmacist if you have questions. What should I tell my health care provider before I  take this medicine? They need to know if you have any of these conditions: -kidney disease -an unusual or allergic reaction to sucralfate, other medicines, foods, dyes, or preservatives -pregnant or trying to get pregnant -breast-feeding How should I use this medicine? Take this medicine by mouth with a glass of water. Follow the directions on the prescription label. This medicine works best if you take it on an empty stomach, 1 hour before meals. Take your doses at regular intervals. Do not take your medicine more often than directed. Do not stop taking except on your doctor's advice. Talk to your pediatrician regarding the use of this medicine in children. Special care may be needed. Overdosage: If you think you have taken too much of this medicine contact a poison control center or emergency room at once. NOTE: This medicine is only for you. Do not share this medicine with others. What if I miss a dose? If you miss a dose, take it as soon as you can. If it is almost time for your next dose, take only that dose. Do not take double or extra doses. What may interact with this medicine? -antacid -cimetidine -digoxin -ketoconazole -phenytoin -quinidine -ranitidine -some antibiotics like ciprofloxacin, norfloxacin, and ofloxacin -theophylline -thyroid hormones -warfarin This list may not describe all possible interactions. Give your health care provider a list of all the medicines, herbs, non-prescription drugs, or dietary supplements you use. Also tell them if you smoke, drink alcohol, or use illegal drugs. Some items may interact with your medicine. What should I watch for while using this medicine? Visit your doctor or health care professional for regular check ups. Let your doctor know if your symptoms do not improve or if you feel worse. Antacids should not be taken within one half hour before or after this medicine. What side effects may I notice from receiving this medicine? Side  effects that you should report to your doctor or health care professional as soon as possible: -allergic reactions like skin rash, itching or hives, swelling of the face, lips, or tongue -difficulty breathing Side effects that usually do not require medical attention (report to your doctor or health care professional if they continue or are bothersome): -back pain -constipation -drowsy, dizzy -dry mouth -headache -stomach upset, gas -trouble sleeping This list may not describe all possible side effects. Call your doctor for medical advice about side effects. You may report side effects to FDA at 1-800-FDA-1088. Where should I keep my medicine? Keep out of the reach of children. Store at room temperature between 15 and 30 degrees C (59 and 86 degrees F). Keep container tightly closed. Throw away any unused medicine after the expiration date. NOTE: This sheet is a summary. It may not cover all possible information. If you have questions about this medicine, talk to your doctor, pharmacist, or health care provider.  2012, [employer] Standard. (01/20/2008 3:46:20 PM)  Prenatal Vitamin and Mineral Combinations (oral solid dosage forms) What is this medicine? PRENATAL VITAMIN AND MINERAL combinations are used before, during, and after pregnancy  to help provide provide good nutrition. This medicine may be used for other purposes; ask your health care provider or pharmacist if you have questions. What should I tell my health care provider before I take this medicine? They need to know if you have any of these conditions: -bleeding or clotting disorder -history of anemia of any type -other chronic health condition -an unusual or allergic reaction to vitamins, minerals, other medicines, foods, dyes, or preservatives How should I use this medicine? Take this medicine by mouth with a glass of water. You can take it with or without food. If it upsets your stomach, take it with food. Chewable  prenatal vitamin tablets may be chewed completely before swallowing. Follow the directions on the prescription label. The usual dose is taken once a day. Do not take your medicine more often than directed. Contact your pediatrician regarding the use of this medicine in children. Special care may be needed. This medicine is intended for females who are pregnant, breast-feeding, or may become pregnant. Overdosage: If you think you have taken too much of this medicine contact a poison control center or emergency room at once. NOTE: This medicine is only for you. Do not share this medicine with others. What if I miss a dose? If you miss a dose, take it as soon as you can. If it is almost time for your next dose, take only that dose. Do not take double or extra doses. What may interact with this medicine? -alendronate -antacids -cefdinir -cefditoren -etidronate -fluoroquinolone antibiotics (examples: ciprofloxacin, gatifloxacin, levofloxacin) -ibandronate -levodopa -risedronate -tetracycline antibiotics (examples: doxycycline, minocycline, tetracycline) -thyroid hormones -warfarin This list may not describe all possible interactions. Give your health care provider a list of all the medicines, herbs, non-prescription drugs, or dietary supplements you use. Also tell them if you smoke, drink alcohol, or use illegal drugs. Some items may interact with your medicine. What should I watch for while using this medicine? See your health care professional for regular checks on your progress. Remember that vitamin and mineral supplements do not replace the need for good nutrition from a balanced diet. Stools commonly change color when vitamins and minerals are taken. Notify your health care professional if this change is alarming or accompanied by other symptoms, like abdominal pain. What side effects may I notice from receiving this medicine? Side effects that you should report to your doctor or health care  professional as soon as possible: -allergic reaction such as skin rash or difficulty breathing -vomiting Side effects that usually do not require medical attention (report to your doctor or health care professional if they continue or are bothersome): -nausea -stomach upset This list may not describe all possible side effects. Call your doctor for medical advice about side effects. You may report side effects to FDA at 1-800-FDA-1088. Where should I keep my medicine? Keep out of the reach of children. Most vitamins and minerals should be stored at controlled room temperature. Check your specific product directions. Protect from heat and moisture. Throw away any unused medicine after the expiration date. NOTE: This sheet is a summary. It may not cover all possible information. If you have questions about this medicine, talk to your doctor, pharmacist, or health care provider.  2012, [employer]vier/Gold Standard. (09/28/2010 4:41:11 PM)

## 2011-07-16 NOTE — ED Notes (Signed)
Upper abdominal pain worse last night after eating baked chicken mashed potatoes with gravy nausea no vomiting  Has been having these episodes off and on for a year

## 2011-07-16 NOTE — ED Provider Notes (Signed)
History     CSN: [id number]  Arrival date & time 07/16/11  1478   First MD Initiated Contact with Patient 07/16/11 [phone]      Chief Complaint  Patient presents with  . Abdominal Pain    (Consider location/radiation/quality/duration/timing/severity/associated sxs/prior treatment) Patient is a 31 y.o. female presenting with abdominal pain. The history is provided by the patient.  Abdominal Pain The primary symptoms of the illness include abdominal pain.  She had onset last night of severe pain in the epigastrium and right upper quadrant with some radiation through to the back. Pain is constant, worse when she lays down, better when she sits up. She rates the pain a 9/10. There's been associated nausea without vomiting. She's has had some diarrhea, but pain is not improved after having a bowel movement. There's been no urinary difficulty. She's not had any fever, chills, sweats. She has had similar episodes of pain over the last 6-12 months. Outpatient workup has been negative. She had been on proton pump inhibitors without relief. She was switched to Carafate in December and had been doing well until today. She states that she had stopped taking the Carafate.  Past Medical History  Diagnosis Date  . Hypertension   . Bilateral ovarian cysts     Past Surgical History  Procedure Date  . Laparoscopy     History reviewed. No pertinent family history.  History  Substance Use Topics  . Smoking status: Never Smoker   . Smokeless tobacco: Not on file  . Alcohol Use: No    OB History    Grav Para Term Preterm Abortions TAB SAB Ect Mult Living                  Review of Systems  Gastrointestinal: Positive for abdominal pain.  All other systems reviewed and are negative.    Allergies  Review of patient's allergies indicates no known allergies.  Home Medications   Current Outpatient Rx  Name Route Sig Dispense Refill  . CYCLOBENZAPRINE HCL 10 MG PO TABS Oral Take 10 mg by  mouth 2 (two) times daily as needed. For muscle spasms.    . DULOXETINE HCL 20 MG PO CPEP Oral Take 20 mg by mouth daily.    Marland Kitchen FLUCONAZOLE 150 MG PO TABS Oral Take 150 mg by mouth once. Take when antibiotics complete    . IBUPROFEN 200 MG PO TABS Oral Take 200-400 mg by mouth every 6 (six) hours as needed. For pain    . ONE-DAILY MULTI VITAMINS PO TABS Oral Take 1 tablet by mouth daily.      . SUDAFED PO Oral Take 1 tablet by mouth once.    . SUCRALFATE 1 GM/10ML PO SUSP Oral Take 1 g by mouth 3 (three) times daily.        BP 129/68  Pulse 102  Temp(Src) 98.5 F (36.9 C) (Oral)  Resp 20  Ht 5\' 2"  (1.575 m)  Wt 140 lb (63.504 kg)  BMI 25.61 kg/m2  SpO2 100%  LMP 05/21/2011  Physical Exam  Nursing note and vitals reviewed.  31 year old female appears uncomfortable. Vital signs show mild tachycardia with heart rate of 102. Oxygen saturation is 100% which is normal. Head is normocephalic and atraumatic. PERRLA, EOMI. There is no scleral icterus. Mucous membranes are moist and oropharynx is clear. Neck is nontender and supple without adenopathy or JVD. Back is nontender. There's no CVA tenderness. Lungs are clear without rales, wheezes, or rhonchi. Heart has regular rate  rhythm without murmur. There is no chest wall tenderness. Abdomen is soft, flat, with moderate tenderness in the epigastrium and right upper quadrant with a negative Murphy sign. There is no hepatosplenomegaly. Peristalsis is diminished. Extremities have no cyanosis or edema, full range of motion is present. Skin is warm and dry without rash. Neurologic: Mental status is normal, cranial nerves are intact, there no focal motor or sensory deficits.  ED Course  Procedures (including critical care time)  Results for orders placed during the hospital encounter of 07/16/11  CBC      Component Value Range   WBC 7.9  4.0 - 10.5 (K/uL)   RBC 4.05  3.87 - 5.11 (MIL/uL)   Hemoglobin 12.3  12.0 - 15.0 (g/dL)   HCT 16.1 (*) 09.6 -  46.0 (%)   MCV 85.4  78.0 - 100.0 (fL)   MCH 30.4  26.0 - 34.0 (pg)   MCHC 35.5  30.0 - 36.0 (g/dL)   RDW 04.5  40.9 - 81.1 (%)   Platelets 237  150 - 400 (K/uL)  DIFFERENTIAL      Component Value Range   Neutrophils Relative 73  43 - 77 (%)   Neutro Abs 5.8  1.7 - 7.7 (K/uL)   Lymphocytes Relative 22  12 - 46 (%)   Lymphs Abs 1.8  0.7 - 4.0 (K/uL)   Monocytes Relative 5  3 - 12 (%)   Monocytes Absolute 0.4  0.1 - 1.0 (K/uL)   Eosinophils Relative 0  0 - 5 (%)   Eosinophils Absolute 0.0  0.0 - 0.7 (K/uL)   Basophils Relative 0  0 - 1 (%)   Basophils Absolute 0.0  0.0 - 0.1 (K/uL)  COMPREHENSIVE METABOLIC PANEL      Component Value Range   Sodium 137  135 - 145 (mEq/L)   Potassium 3.8  3.5 - 5.1 (mEq/L)   Chloride 104  96 - 112 (mEq/L)   CO2 23  19 - 32 (mEq/L)   Glucose, Bld 88  70 - 99 (mg/dL)   BUN 8  6 - 23 (mg/dL)   Creatinine, Ser 9.14  0.50 - 1.10 (mg/dL)   Calcium 9.2  8.4 - 78.2 (mg/dL)   Total Protein 7.0  6.0 - 8.3 (g/dL)   Albumin 3.6  3.5 - 5.2 (g/dL)   AST 13  0 - 37 (U/L)   ALT 8  0 - 35 (U/L)   Alkaline Phosphatase 37 (*) 39 - 117 (U/L)   Total Bilirubin 0.3  0.3 - 1.2 (mg/dL)   GFR calc non Af Amer >90  >90 (mL/min)   GFR calc Af Amer >90  >90 (mL/min)  LIPASE, BLOOD      Component Value Range   Lipase 17  11 - 59 (U/L)  URINALYSIS, ROUTINE W REFLEX MICROSCOPIC      Component Value Range   Color, Urine YELLOW  YELLOW    APPearance CLEAR  CLEAR    Specific Gravity, Urine 1.020  1.005 - 1.030    pH 6.0  5.0 - 8.0    Glucose, UA NEGATIVE  NEGATIVE (mg/dL)   Hgb urine dipstick NEGATIVE  NEGATIVE    Bilirubin Urine NEGATIVE  NEGATIVE    Ketones, ur NEGATIVE  NEGATIVE (mg/dL)   Protein, ur NEGATIVE  NEGATIVE (mg/dL)   Urobilinogen, UA 0.2  0.0 - 1.0 (mg/dL)   Nitrite NEGATIVE  NEGATIVE    Leukocytes, UA NEGATIVE  NEGATIVE   PREGNANCY, URINE      Component  Value Range   Preg Test, Ur POSITIVE (*) NEGATIVE    She was given a GI cocktail with  significant relief of pain. Pain is down to 5/10. She was given Zofran for nausea with good relief of nausea. She is advised of positive pregnancy test and expressed shock stating that she has been on birth control pills since the beginning of January and has not missed any. She is advised to get a repeat pregnancy test in several days to confirm. She will be sent home with a prescription for Reglan to take as needed for nausea and a prescription for Carafate to take 4 times a day, and a prescription for prenatal vitamins. I have discussed with her possibility of getting a HIDA scan, but this should only be done when she is not pregnant.   1. Abdominal pain   2. Pregnancy as incidental finding       MDM  Abdominal pain in a pattern that would be suspicious for biliary colic. Old records have been reviewed and she's had a negative CT scan and negative ultrasound. In the absence of gallstones, she still could have acalculous cholecystitis and, and an outpatient HIDA scan may be indicated. Laboratory workup and symptomatic relief will be done today.        Dione Booze, MD 07/16/11 1030

## 2011-07-16 NOTE — ED Notes (Signed)
Pt is tender to palpation right upper quadrant abdomen just under rib

## 2011-07-25 ENCOUNTER — Telehealth: Payer: Self-pay | Admitting: Family Medicine

## 2011-07-25 NOTE — Telephone Encounter (Signed)
Entered in error

## 2011-07-30 LAB — OB RESULTS CONSOLE GC/CHLAMYDIA: Chlamydia: NEGATIVE

## 2011-07-30 LAB — OB RESULTS CONSOLE RPR: RPR: NONREACTIVE

## 2011-07-30 LAB — OB RESULTS CONSOLE ANTIBODY SCREEN: Antibody Screen: NEGATIVE

## 2011-08-06 ENCOUNTER — Other Ambulatory Visit (HOSPITAL_COMMUNITY): Payer: Managed Care, Other (non HMO)

## 2011-08-11 ENCOUNTER — Ambulatory Visit (INDEPENDENT_AMBULATORY_CARE_PROVIDER_SITE_OTHER): Payer: Managed Care, Other (non HMO) | Admitting: Family Medicine

## 2011-08-11 VITALS — BP 124/79 | HR 94 | Temp 98.9°F | Resp 16 | Ht 63.0 in | Wt 143.0 lb

## 2011-08-11 DIAGNOSIS — R519 Headache, unspecified: Secondary | ICD-10-CM

## 2011-08-11 DIAGNOSIS — J019 Acute sinusitis, unspecified: Secondary | ICD-10-CM

## 2011-08-11 MED ORDER — MOMETASONE FUROATE 50 MCG/ACT NA SUSP: 2.0000 | Freq: Every day | NASAL | Status: DC

## 2011-08-11 NOTE — Progress Notes (Signed)
31 year old pregnant woman comes in with progressive sinus pressure and pain. She had a deep sinus infection back in January which is treated with Levaquin. Subsequently she got pregnant and she has done well until the last week or so.  Objective: HEENT is unremarkable the exception of some swollen nasal passages. TMs are negative. Oropharynx negative. Chest clear.  Assessment: Sinus congestion with headache  Plan: Nasonex-if no better in [id number] hours will consider Z-Pak

## 2011-08-19 ENCOUNTER — Telehealth: Payer: Self-pay

## 2011-08-19 MED ORDER — AZITHROMYCIN 250 MG PO TABS: ORAL_TABLET | ORAL | Status: DC

## 2011-08-19 NOTE — Telephone Encounter (Signed)
PATIENT SAW DR. Milus Glazier LAST Sunday AND SHE WAS GIVEN NASONEX.  HE TOLD HER TO CALL IF IT DIDN'T HELP.  ITS NOT HELPING AND HER EARS ARE STOPPED UP NOW.  CAN WE PLEASE CALL HER IN SOMETHING ELSE.  [facility] ON [address]

## 2011-08-19 NOTE — Telephone Encounter (Signed)
Tell pt to take Zpack (Dr. Cain Saupe plan) and use Nasonex for another week.  RTC or see OB if symptoms persist.

## 2011-08-20 ENCOUNTER — Encounter (HOSPITAL_COMMUNITY): Payer: Self-pay

## 2011-08-20 ENCOUNTER — Inpatient Hospital Stay (HOSPITAL_COMMUNITY)
Admission: AD | Admit: 2011-08-20 | Discharge: 2011-08-20 | Disposition: A | Discharge status: Home / Self Care | Payer: Managed Care, Other (non HMO) | Source: Ambulatory Visit | Attending: Obstetrics and Gynecology | Admitting: Obstetrics and Gynecology

## 2011-08-20 DIAGNOSIS — O210 Mild hyperemesis gravidarum: Secondary | ICD-10-CM | POA: Insufficient documentation

## 2011-08-20 DIAGNOSIS — A0811 Acute gastroenteropathy due to Norwalk agent: Secondary | ICD-10-CM | POA: Insufficient documentation

## 2011-08-20 DIAGNOSIS — R109 Unspecified abdominal pain: Secondary | ICD-10-CM | POA: Insufficient documentation

## 2011-08-20 DIAGNOSIS — Z331 Pregnant state, incidental: Secondary | ICD-10-CM

## 2011-08-20 LAB — COMPREHENSIVE METABOLIC PANEL
ALT: 9 U/L (ref 0–35)
BUN: 6 mg/dL (ref 6–23)
CO2: 24 mEq/L (ref 19–32)
Calcium: 9 mg/dL (ref 8.4–10.5)
GFR calc Af Amer: >90 mL/min (ref >90)
GFR calc non Af Amer: >90 mL/min (ref >90)
Glucose, Bld: 81 mg/dL (ref 70–99)
Sodium: 134 mEq/L — ABNORMAL LOW (ref 135–145)
Total Protein: 6.6 g/dL (ref 6.0–8.3)

## 2011-08-20 LAB — URINE MICROSCOPIC-ADD ON

## 2011-08-20 LAB — URINALYSIS, ROUTINE W REFLEX MICROSCOPIC
Bilirubin Urine: NEGATIVE
Glucose, UA: NEGATIVE mg/dL
Ketones, ur: NEGATIVE mg/dL
Nitrite: NEGATIVE
Specific Gravity, Urine: 1.025 (ref 1.005–1.030)
pH: 6 (ref 5.0–8.0)

## 2011-08-20 LAB — CBC
HCT: 38.4 % (ref 36.0–46.0)
Hemoglobin: 12.8 g/dL (ref 12.0–15.0)
MCH: 29.5 pg (ref 26.0–34.0)
MCHC: 33.3 g/dL (ref 30.0–36.0)
MCV: 88.5 fL (ref 78.0–100.0)
RBC: 4.34 MIL/uL (ref 3.87–5.11)

## 2011-08-20 MED ORDER — ONDANSETRON 8 MG PO TBDP: 8.0000 mg | ORAL_TABLET | Freq: Three times a day (TID) | ORAL | Status: AC | PRN

## 2011-08-20 MED ORDER — PROMETHAZINE HCL 25 MG PO TABS: 25.0000 mg | ORAL_TABLET | Freq: Four times a day (QID) | ORAL | Status: DC | PRN

## 2011-08-20 MED ORDER — ONDANSETRON 8 MG/NS 50 ML IVPB
8.0000 mg | Freq: Once | INTRAVENOUS | Status: AC
  Administered 2011-08-20: 8 mg via INTRAVENOUS
  Filled 2011-08-20: qty 8

## 2011-08-20 MED ORDER — DEXTROSE 5 % IN LACTATED RINGERS IV BOLUS
1000.0000 mL | Freq: Once | INTRAVENOUS | Status: AC
  Administered 2011-08-20: 1000 mL via INTRAVENOUS

## 2011-08-20 MED ORDER — M.V.I. ADULT IV INJ
10.0000 mL | Freq: Once | INTRAVENOUS | Status: AC
  Administered 2011-08-20: 10 mL via INTRAVENOUS
  Filled 2011-08-20: qty 10

## 2011-08-20 NOTE — MAU Note (Signed)
Patient states she started having diarrhea and vomiting yesterday. Has had a sinus infection and has pressure in the head. Unable to keep anything down. Some mild abdominal cramping.

## 2011-08-20 NOTE — Telephone Encounter (Signed)
lmom for pt to cb

## 2011-08-20 NOTE — MAU Provider Note (Signed)
Chief Complaint:  Emesis and Diarrhea    First Provider Initiated Contact with Patient 08/20/11 1015      Jenna Mcclure is  31 y.o. [id number].  Patient's last menstrual period was 05/21/2011..  [redacted]w[redacted]d   She presents complaining of Emesis and Diarrhea . Onset is described as sudden and has been present for  1 days. Denies fever, chills, abd pain, bleeding, vaginal discharge, or dysuria.  Obstetrical/Gynecological History: OB History    Grav Para Term Preterm Abortions TAB SAB Ect Mult Living   5 4 4  0 0 0 0 0 0 4      Past Medical History: Past Medical History  Diagnosis Date  . Hypertension   . Bilateral ovarian cysts     Past Surgical History: Past Surgical History  Procedure Date  . Laparoscopy     Family History: History reviewed. No pertinent family history.  Social History: History  Substance Use Topics  . Smoking status: Never Smoker   . Smokeless tobacco: Never Used  . Alcohol Use: No    Allergies: No Known Allergies  Prescriptions prior to admission  Medication Sig Dispense Refill  . acetaminophen (TYLENOL) 500 MG tablet Take 1,000 mg by mouth every 6 (six) hours as needed. As needed for headache      . mometasone (NASONEX) 50 MCG/ACT nasal spray Place 2 sprays into the nose daily.  17 g  12  . Prenatal Vit-Fe Fumarate-FA (PRENATAL COMPLETE) 14-0.4 MG TABS Take 1 tablet by mouth 1 day or 1 dose.  30 each  0  . Pseudoephedrine HCl (SUDAFED PO) Take 1 tablet by mouth daily as needed. For sinus pressure      . sucralfate (CARAFATE) 1 G tablet Take 1 tablet (1 g total) by mouth 4 (four) times daily.  120 tablet  0    Review of Systems - Negative except what has been reviewed in the HPI  Physical Exam   Blood pressure 113/64, pulse 91, temperature 98.7 F (37.1 C), temperature source Oral, resp. rate 16, height 5' 2.5" (1.588 m), weight 65.227 kg (143 lb 12.8 oz), last menstrual period 05/21/2011, SpO2 100.00%.  General: General appearance - alert, well  appearing, and in no distress, oriented to person, place, and time, normal appearing weight and ill-appearing Mental status - alert, oriented to person, place, and time, normal mood, behavior, speech, dress, motor activity, and thought processes, affect appropriate to mood Abdomen - soft, nontender, nondistended, no masses or organomegaly Focused Gynecological Exam: examination not indicated, FHR dopplered by RN  Labs: Recent Results (from the past 24 hour(s))  URINALYSIS, ROUTINE W REFLEX MICROSCOPIC   Collection Time   08/20/11 10:00 AM      Component Value Range   Color, Urine YELLOW  YELLOW    APPearance CLEAR  CLEAR    Specific Gravity, Urine 1.025  1.005 - 1.030    pH 6.0  5.0 - 8.0    Glucose, UA NEGATIVE  NEGATIVE (mg/dL)   Hgb urine dipstick TRACE (*) NEGATIVE    Bilirubin Urine NEGATIVE  NEGATIVE    Ketones, ur NEGATIVE  NEGATIVE (mg/dL)   Protein, ur NEGATIVE  NEGATIVE (mg/dL)   Urobilinogen, UA 0.2  0.0 - 1.0 (mg/dL)   Nitrite NEGATIVE  NEGATIVE    Leukocytes, UA NEGATIVE  NEGATIVE   URINE MICROSCOPIC-ADD ON   Collection Time   08/20/11 10:00 AM      Component Value Range   Squamous Epithelial / LPF FEW (*) RARE  Bacteria, UA RARE  RARE    Urine-Other MUCOUS PRESENT    CBC   Collection Time   08/20/11 10:16 AM      Component Value Range   WBC 9.4  4.0 - 10.5 (K/uL)   RBC 4.34  3.87 - 5.11 (MIL/uL)   Hemoglobin 12.8  12.0 - 15.0 (g/dL)   HCT 14.7  82.9 - 56.2 (%)   MCV 88.5  78.0 - 100.0 (fL)   MCH 29.5  26.0 - 34.0 (pg)   MCHC 33.3  30.0 - 36.0 (g/dL)   RDW 13.0  86.5 - 78.4 (%)   Platelets 238  150 - 400 (K/uL)  COMPREHENSIVE METABOLIC PANEL   Collection Time   08/20/11 10:16 AM      Component Value Range   Sodium 134 (*) 135 - 145 (mEq/L)   Potassium 3.9  3.5 - 5.1 (mEq/L)   Chloride 102  96 - 112 (mEq/L)   CO2 24  19 - 32 (mEq/L)   Glucose, Bld 81  70 - 99 (mg/dL)   BUN 6  6 - 23 (mg/dL)   Creatinine, Ser 6.96  0.50 - 1.10 (mg/dL)   Calcium 9.0  8.4  - 10.5 (mg/dL)   Total Protein 6.6  6.0 - 8.3 (g/dL)   Albumin 3.0 (*) 3.5 - 5.2 (g/dL)   AST 14  0 - 37 (U/L)   ALT 9  0 - 35 (U/L)   Alkaline Phosphatase 49  39 - 117 (U/L)   Total Bilirubin 0.3  0.3 - 1.2 (mg/dL)   GFR calc non Af Amer >90  >90 (mL/min)   GFR calc Af Amer >90  >90 (mL/min)   MD Consult: Discussed with Dr. Vincente Poli, agrees with plan  ED course: 1:45 PM pt feeling some better after IVF and anti-enemtics  Assessment: Norovirus Pregnant  Plan: Discharge home Rx zofran and phenergan sent to pharmacy Norovirus precautions reviewed BRAT diet FU at [facility] as scheduled Recommend delaying start of ABX for sinus infection for 48-72 hours, until GI s/s resolve.  Franki Stemen E. 08/20/2011,1:43 PM

## 2011-08-20 NOTE — Discharge Instructions (Signed)
B.R.A.T. Diet Your doctor has recommended the B.R.A.T. diet for you or your child until the condition improves. This is often used to help control diarrhea and vomiting symptoms. If you or your child can tolerate clear liquids, you may have:  Bananas.   Rice.   Applesauce.   Toast (and other simple starches such as crackers, potatoes, noodles).  Be sure to avoid dairy products, meats, and fatty foods until symptoms are better. Fruit juices such as apple, grape, and prune juice can make diarrhea worse. Avoid these. Continue this diet for 2 days or as instructed by your caregiver. Document Released: 05/20/2005 Document Revised: 05/09/2011 Document Reviewed: 11/06/2006 [facility] Patient Information 2012 [place], [place].Norovirus Infection Norovirus illness is caused by a viral infection. The term norovirus refers to a group of viruses. Any of those viruses can cause norovirus illness. This illness is often referred to by other names such as viral gastroenteritis, stomach flu, and food poisoning. Anyone can get a norovirus infection. People can have the illness multiple times during their lifetime. CAUSES  Norovirus is found in the stool or vomit of infected people. It is easily spread from person to person (contagious). People with norovirus are contagious from the moment they begin feeling ill. They may remain contagious for as long as 3 days to 2 weeks after recovery. People can become infected with the virus in several ways. This includes:  Eating food or drinking liquids that are contaminated with norovirus.   Touching surfaces or objects contaminated with norovirus, and then placing your hand in your mouth.   Having direct contact with a person who is infected and shows symptoms. This may occur while caring for someone with illness or while sharing foods or eating utensils with someone who is ill.  SYMPTOMS  Symptoms usually begin 1 to 2 days after ingestion of the virus. Symptoms may  include:  Nausea.   Vomiting.   Diarrhea.   Stomach cramps.   Low-grade fever.   Chills.   Headache.   Muscle aches.   Tiredness.  Most people with norovirus illness get better within 1 to 2 days. Some people become dehydrated because they cannot drink enough liquids to replace those lost from vomiting and diarrhea. This is especially true for young children, the elderly, and others who are unable to care for themselves. DIAGNOSIS  Diagnosis is based on your symptoms and exam. Currently, only state public health laboratories have the ability to test for norovirus in stool or vomit. TREATMENT  No specific treatment exists for norovirus infections. No vaccine is available to prevent infections. Norovirus illness is usually brief in healthy people. If you are ill with vomiting and diarrhea, you should drink enough water and fluids to keep your urine clear or pale yellow. Dehydration is the most serious health effect that can result from this infection. By drinking oral rehydration solution (ORS), people can reduce their chance of becoming dehydrated. There are many commercially available pre-made and powdered ORS designed to safely rehydrate people. These may be recommended by your caregiver. Replace any new fluid losses from diarrhea or vomiting with ORS as follows:  If your child weighs 10 kg or less (22 lb or less), give 60 to 120 ml ( to  cup or 2 to 4 oz) of ORS for each diarrheal stool or vomiting episode.   If your child weighs more than 10 kg (more than 22 lb), give 120 to 240 ml ( to 1 cup or 4 to 8 oz) of ORS  for each diarrheal stool or vomiting episode.  HOME CARE INSTRUCTIONS   Follow all your caregiver's instructions.   Avoid sugar-free and alcoholic drinks while ill.   Only take over-the-counter or prescription medicines for pain, vomiting, diarrhea, or fever as directed by your caregiver.  You can decrease your chances of coming in contact with norovirus or  spreading it by following these steps:  Frequently wash your hands, especially after using the toilet, changing diapers, and before eating or preparing food.   Carefully wash fruits and vegetables. Cook shellfish before eating them.   Do not prepare food for others while you are infected and for at least 3 days after recovering from illness.   Thoroughly clean and disinfect contaminated surfaces immediately after an episode of illness using a bleach-based household cleaner.   Immediately remove and wash clothing or linens that may be contaminated with the virus.   Use the toilet to dispose of any vomit or stool. Make sure the surrounding area is kept clean.   Food that may have been contaminated by an ill person should be discarded.  SEEK IMMEDIATE MEDICAL CARE IF:   You develop symptoms of dehydration that do not improve with fluid replacement. This may include:   Excessive sleepiness.   Lack of tears.   Dry mouth.   Dizziness when standing.   Weak pulse.  Document Released: 08/10/2002 Document Revised: 05/09/2011 Document Reviewed: 09/11/2009 [facility] Patient Information 2012 [place], [place].

## 2011-08-21 NOTE — Telephone Encounter (Signed)
LMOM for pt with information about Rx for Zpack and f/up if not resolved after using that and Nasonex another week. Asked for CB with any ?s.

## 2011-09-09 ENCOUNTER — Encounter (HOSPITAL_COMMUNITY): Payer: Self-pay | Admitting: *Deleted

## 2011-09-09 ENCOUNTER — Ambulatory Visit (INDEPENDENT_AMBULATORY_CARE_PROVIDER_SITE_OTHER): Payer: Managed Care, Other (non HMO) | Admitting: Emergency Medicine

## 2011-09-09 ENCOUNTER — Inpatient Hospital Stay (HOSPITAL_COMMUNITY)
Admission: AD | Admit: 2011-09-09 | Discharge: 2011-09-09 | Disposition: A | Discharge status: Home / Self Care | Payer: Managed Care, Other (non HMO) | Attending: Obstetrics and Gynecology | Admitting: Obstetrics and Gynecology

## 2011-09-09 VITALS — BP 111/72 | HR 89 | Temp 98.3°F | Resp 16 | Ht 63.0 in | Wt 146.0 lb

## 2011-09-09 DIAGNOSIS — B373 Candidiasis of vulva and vagina: Secondary | ICD-10-CM | POA: Insufficient documentation

## 2011-09-09 DIAGNOSIS — B3731 Acute candidiasis of vulva and vagina: Secondary | ICD-10-CM | POA: Insufficient documentation

## 2011-09-09 DIAGNOSIS — L293 Anogenital pruritus, unspecified: Secondary | ICD-10-CM | POA: Insufficient documentation

## 2011-09-09 DIAGNOSIS — Z331 Pregnant state, incidental: Secondary | ICD-10-CM

## 2011-09-09 DIAGNOSIS — G501 Atypical facial pain: Secondary | ICD-10-CM

## 2011-09-09 DIAGNOSIS — O2390 Unspecified genitourinary tract infection in pregnancy, unspecified trimester: Secondary | ICD-10-CM | POA: Insufficient documentation

## 2011-09-09 DIAGNOSIS — R51 Headache: Secondary | ICD-10-CM | POA: Insufficient documentation

## 2011-09-09 DIAGNOSIS — R6884 Jaw pain: Secondary | ICD-10-CM

## 2011-09-09 DIAGNOSIS — M279 Disease of jaws, unspecified: Secondary | ICD-10-CM

## 2011-09-09 LAB — WET PREP, GENITAL

## 2011-09-09 MED ORDER — FLUCONAZOLE 150 MG PO TABS: 150.0000 mg | ORAL_TABLET | Freq: Once | ORAL | Status: AC

## 2011-09-09 MED ORDER — LORATADINE 10 MG PO TABS: 10.0000 mg | ORAL_TABLET | Freq: Every day | ORAL | Status: DC

## 2011-09-09 NOTE — MAU Provider Note (Signed)
History     CSN: [id number]  Arrival date and time: 09/09/11 0651   None     No chief complaint on file.  HPI 31 y.o. [id number] at [redacted]w[redacted]d with ongoing right sided headache and face/jaw pain. Denies nasal discharge, congestion, sore throat, fever, cough. Has been treated for a sinus infection with 3 different antibiotics over the last few months with no relief, also using Tylenol and Nasonex. Also c/o onset of vaginal itching and discharge since starting last round of abx, thinks she has a yeast infection.     Past Medical History  Diagnosis Date  . Hypertension   . Bilateral ovarian cysts     Past Surgical History  Procedure Date  . Laparoscopy     Family History  Problem Relation Age of Onset  . Hypertension Mother   . Hypertension Maternal Aunt     History  Substance Use Topics  . Smoking status: Never Smoker   . Smokeless tobacco: Never Used  . Alcohol Use: No    Allergies:  Allergies  Allergen Reactions  . Monistat     Patient states she used this twice and "broke out and the symptoms got worse"    Prescriptions prior to admission  Medication Sig Dispense Refill  . acetaminophen (TYLENOL) 500 MG tablet Take 1,000 mg by mouth every 6 (six) hours as needed. As needed for headache      . amoxicillin-clavulanate (AUGMENTIN) 500-125 MG per tablet Take 1 tablet by mouth 3 (three) times daily. For a sinus infection      . mometasone (NASONEX) 50 MCG/ACT nasal spray Place 2 sprays into the nose daily.  17 g  12  . Prenatal Vit-Fe Fumarate-FA (PRENATAL COMPLETE) 14-0.4 MG TABS Take 1 tablet by mouth 1 day or 1 dose.  30 each  0  . Pseudoephedrine HCl (SUDAFED PO) Take 1 tablet by mouth daily as needed. For sinus pressure      . sucralfate (CARAFATE) 1 G tablet Take 1 tablet (1 g total) by mouth 4 (four) times daily.  120 tablet  0    Review of Systems  Constitutional: Negative.  Negative for fever.  HENT: Positive for ear pain. Negative for nosebleeds, congestion  and sore throat.   Eyes: Negative for pain.  Respiratory: Negative.  Negative for cough and stridor.   Cardiovascular: Negative.   Gastrointestinal: Negative for nausea, vomiting, abdominal pain, diarrhea and constipation.  Genitourinary: Negative for dysuria, urgency, frequency, hematuria and flank pain.       Negative for vaginal bleeding, Positive for vaginal discharge and itching  Musculoskeletal: Negative.   Neurological: Positive for headaches.  Psychiatric/Behavioral: Negative.    Physical Exam   Blood pressure 126/76, pulse 90, temperature 98.4 F (36.9 C), temperature source Oral, resp. rate 16, height 5\' 2"  (1.575 m), weight 146 lb 8 oz (66.452 kg), last menstrual period 05/21/2011.  Physical Exam  Nursing note and vitals reviewed. Constitutional: She is oriented to person, place, and time. She appears well-developed and well-nourished. No distress.  HENT:  Head: Normocephalic and atraumatic.  Right Ear: Hearing, tympanic membrane, external ear and ear canal normal.  Nose: Nose normal.  Mouth/Throat: Oropharynx is clear and moist.    Eyes: Right eye exhibits no discharge. Left eye exhibits no discharge.  Neck: Normal range of motion. Neck supple.  Cardiovascular: Normal rate, regular rhythm and normal heart sounds.   Respiratory: Effort normal. No stridor. No respiratory distress.  Genitourinary: There is no rash or lesion on the  right labia. There is no rash or lesion on the left labia. Uterus is not deviated, not enlarged, not fixed and not tender. Cervix exhibits no motion tenderness, no discharge and no friability. Right adnexum displays no mass, no tenderness and no fullness. Left adnexum displays no mass, no tenderness and no fullness. No erythema, tenderness or bleeding around the vagina. No vaginal discharge found.  Musculoskeletal: Normal range of motion.  Lymphadenopathy:    She has no cervical adenopathy.  Neurological: She is alert and oriented to person,  place, and time. No cranial nerve deficit. Coordination normal.  Skin: Skin is warm and dry.  Psychiatric: She has a normal mood and affect.    MAU Course  Procedures  Results for orders placed during the hospital encounter of 09/09/11 (from the past 24 hour(s))  WET PREP, GENITAL     Status: Abnormal   Collection Time   09/09/11  8:20 AM      Component Value Range   Yeast Wet Prep HPF POC MANY (*) NONE SEEN    Trich, Wet Prep NONE SEEN  NONE SEEN    Clue Cells Wet Prep HPF POC NONE SEEN  NONE SEEN    WBC, Wet Prep HPF POC MANY (*) NONE SEEN      Assessment and Plan  30 y.o. [id number] at [redacted]w[redacted]d Ongoing right sided headache/face/jaw pain - recommended continuing current meds, but should follow up with PCP/dentist as this may not be r/t sinus infection Yeast -rx diflucan  Mardella Nuckles 09/09/2011, 8:55 AM

## 2011-09-09 NOTE — Progress Notes (Signed)
  Subjective:    Patient ID: Jenna Mcclure, female    DOB: 1981-05-02, 30 y.o.   MRN: [id number]  HPI patient enters with ongoing issues with pain in her right cheek and right year. She's been treated as sinus infection but has had no improvement on antibiotics for sinusitis. She apparently spent the morning at [facility] because of a yeast infection and evaluation of this pain. She is seeing Dr. Milus Glazier who recommended consideration of an MRI.    Review of Systems patient currently [redacted] weeks pregnant and doing well to     Objective:   Physical Exam HEENT exam reveals TMs to be normal the nose is normal throat is clear there is tenderness to palpation over the right jaw.        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 I called Dr. Charlesetta Shanks office but he was not available to answer questions regarding MRI and pregnant women. I refer the patient to her dentist to discuss the situation prior to doing any imaging procedures.

## 2011-09-09 NOTE — Patient Instructions (Signed)
Temporomandibular Problems   Temporomandibular joint (TMJ) dysfunction means there are problems with the joint between your jaw and your skull. This is a joint lined by cartilage like other joints in your body but also has a small disc in the joint which keeps the bones from rubbing on each other. These joints are like other joints and can get inflamed (sore) from arthritis and other problems. When this joint gets sore, it can cause headaches and pain in the jaw and the face.   CAUSES   Usually the arthritic types of problems are caused by soreness in the joint. Soreness in the joint can also be caused by overuse. This may come from grinding your teeth. It may also come from mis-alignment in the joint.   DIAGNOSIS   Diagnosis of this condition can often be made by history and exam. Sometimes your caregiver may need X-rays or an MRI scan to determine the exact cause. It may be necessary to see your dentist to determine if your teeth and jaws are lined up correctly.   TREATMENT   Most of the time this problem is not serious; however, sometimes it can persist (become chronic). When this happens medications that will cut down on inflammation (soreness) help. Sometimes a shot of cortisone into the joint will be helpful. If your teeth are not aligned it may help for your dentist to make a splint for your mouth that can help this problem. If no physical problems can be found, the problem may come from tension. If tension is found to be the cause, biofeedback or relaxation techniques may be helpful.   HOME CARE INSTRUCTIONS   Later in the day, applications of ice packs may be helpful. Ice can be used in a plastic bag with a towel around it to prevent frostbite to skin. This may be used about every 2 hours for 20 to 30 minutes, as needed while awake, or as directed by your caregiver.   Only take over-the-counter or prescription medicines for pain, discomfort, or fever as directed by your caregiver.   If physical therapy was  prescribed, follow your caregiver's directions.   Wear mouth appliances as directed if they were given.   Document Released: 02/12/2001 Document Revised: 05/09/2011 Document Reviewed: 05/22/2008   Exit[employer]® Patient Information ©2012 [employer].

## 2011-09-09 NOTE — MAU Note (Signed)
PT SAYS  HAS BAD SINUSES X2 MTHS AND     AT 0400  HER FACE  AND JAW AND H/A HURTS.    SHE TAKES MED- ON 3RD ANTX --.Marland Kitchen AND NOTICED YEAST INFECTION-  ON SAT.  SAYS EYES HURT-  COMES/ GOES.

## 2011-10-08 ENCOUNTER — Encounter (HOSPITAL_COMMUNITY): Payer: Self-pay | Admitting: *Deleted

## 2011-11-11 ENCOUNTER — Encounter (HOSPITAL_COMMUNITY): Payer: Self-pay | Admitting: *Deleted

## 2011-11-11 ENCOUNTER — Inpatient Hospital Stay (HOSPITAL_COMMUNITY)
Admission: AD | Admit: 2011-11-11 | Discharge: 2011-11-11 | Disposition: A | Discharge status: Home / Self Care | Payer: Managed Care, Other (non HMO) | Source: Ambulatory Visit | Attending: Obstetrics and Gynecology | Admitting: Obstetrics and Gynecology

## 2011-11-11 DIAGNOSIS — O2650 Maternal hypotension syndrome, unspecified trimester: Secondary | ICD-10-CM | POA: Insufficient documentation

## 2011-11-11 DIAGNOSIS — R197 Diarrhea, unspecified: Secondary | ICD-10-CM | POA: Insufficient documentation

## 2011-11-11 LAB — COMPREHENSIVE METABOLIC PANEL
ALT: 9 U/L (ref 0–35)
AST: 14 U/L (ref 0–37)
Alkaline Phosphatase: 60 U/L (ref 39–117)
CO2: 24 mEq/L (ref 19–32)
Calcium: 8.9 mg/dL (ref 8.4–10.5)
Chloride: 102 mEq/L (ref 96–112)
GFR calc Af Amer: >90 mL/min (ref >90)
GFR calc non Af Amer: >90 mL/min (ref >90)
Glucose, Bld: 85 mg/dL (ref 70–99)
Potassium: 3.3 mEq/L — ABNORMAL LOW (ref 3.5–5.1)
Sodium: 134 mEq/L — ABNORMAL LOW (ref 135–145)

## 2011-11-11 LAB — URINALYSIS, ROUTINE W REFLEX MICROSCOPIC
Bilirubin Urine: NEGATIVE
Leukocytes, UA: NEGATIVE
Nitrite: NEGATIVE
Specific Gravity, Urine: <1.005 — ABNORMAL LOW (ref 1.005–1.030)
Urobilinogen, UA: 0.2 mg/dL (ref 0.0–1.0)
pH: 6 (ref 5.0–8.0)

## 2011-11-11 LAB — CBC
Hemoglobin: 12.8 g/dL (ref 12.0–15.0)
Platelets: 196 10*3/uL (ref 150–400)
RBC: 4.16 MIL/uL (ref 3.87–5.11)
WBC: 12.3 10*3/uL — ABNORMAL HIGH (ref 4.0–10.5)

## 2011-11-11 MED ORDER — SODIUM CHLORIDE 0.9 % IV BOLUS (SEPSIS): 500.0000 mL | Freq: Once | INTRAVENOUS | Status: DC

## 2011-11-11 MED ORDER — LACTATED RINGERS IV BOLUS (SEPSIS)
1000.0000 mL | Freq: Once | INTRAVENOUS | Status: DC
  Administered 2011-11-11: 1000 mL via INTRAVENOUS

## 2011-11-11 NOTE — Discharge Instructions (Signed)
Diarrhea Infections caused by germs (bacterial) or a virus commonly cause diarrhea. Your caregiver has determined that with time, rest and fluids, the diarrhea should improve. In general, eat normally while drinking more water than usual. Although water may prevent dehydration, it does not contain salt and minerals (electrolytes). Broths, weak tea without caffeine and oral rehydration solutions (ORS) replace fluids and electrolytes. Small amounts of fluids should be taken frequently. Large amounts at one time may not be tolerated. Plain water may be harmful in infants and the elderly. Oral rehydrating solutions (ORS) are available at pharmacies and grocery stores. ORS replace water and important electrolytes in proper proportions. Sports drinks are not as effective as ORS and may be harmful due to sugars worsening diarrhea.  ORS is especially recommended for use in children with diarrhea. As a general guideline for children, replace any new fluid losses from diarrhea and/or vomiting with ORS as follows:   If your child weighs 22 pounds or under (10 kg or less), give 60-120 mL ( -  cup or 2 - 4 ounces) of ORS for each episode of diarrheal stool or vomiting episode.   If your child weighs more than 22 pounds (more than 10 kgs), give 120-240 mL ( - 1 cup or 4 - 8 ounces) of ORS for each diarrheal stool or episode of vomiting.   While correcting for dehydration, children should eat normally. However, foods high in sugar should be avoided because this may worsen diarrhea. Large amounts of carbonated soft drinks, juice, gelatin desserts and other highly sugared drinks should be avoided.   After correction of dehydration, other liquids that are appealing to the child may be added. Children should drink small amounts of fluids frequently and fluids should be increased as tolerated. Children should drink enough fluids to keep urine clear or pale yellow.   Adults should eat normally while drinking more fluids  than usual. Drink small amounts of fluids frequently and increase as tolerated. Drink enough fluids to keep urine clear or pale yellow. Broths, weak decaffeinated tea, lemon lime soft drinks (allowed to go flat) and ORS replace fluids and electrolytes.   Avoid:   Carbonated drinks.   Juice.   Extremely hot or cold fluids.   Caffeine drinks.   Fatty, greasy foods.   Alcohol.   Tobacco.   Too much intake of anything at one time.   Gelatin desserts.   Probiotics are active cultures of beneficial bacteria. They may lessen the amount and number of diarrheal stools in adults. Probiotics can be found in yogurt with active cultures and in supplements.   Wash hands well to avoid spreading bacteria and virus.   Anti-diarrheal medications are not recommended for infants and children.   Only take over-the-counter or prescription medicines for pain, discomfort or fever as directed by your caregiver. Do not give aspirin to children because it may cause Reye's Syndrome.   For adults, ask your caregiver if you should continue all prescribed and over-the-counter medicines.   If your caregiver has given you a follow-up appointment, it is very important to keep that appointment. Not keeping the appointment could result in a chronic or permanent injury, and disability. If there is any problem keeping the appointment, you must call back to this facility for assistance.  SEEK IMMEDIATE MEDICAL CARE IF:   You or your child is unable to keep fluids down or other symptoms or problems become worse in spite of treatment.   Vomiting or diarrhea develops and becomes persistent.     There is vomiting of blood or bile (green material).   There is blood in the stool or the stools are black and tarry.   There is no urine output in 6-8 hours or there is only a small amount of very dark urine.   Abdominal pain develops, increases or localizes.   You have a fever.   Your baby is older than 3 months with a  rectal temperature of 102 F (38.9 C) or higher.   Your baby is 65 months old or younger with a rectal temperature of 100.4 F (38 C) or higher.   You or your child develops excessive weakness, dizziness, fainting or extreme thirst.   You or your child develops a rash, stiff neck, severe headache or become irritable or sleepy and difficult to awaken.  MAKE SURE YOU:   Understand these instructions.   Will watch your condition.   Will get help right away if you are not doing well or get worse.  Document Released: 05/10/2002 Document Revised: 05/09/2011 Document Reviewed: 03/27/2009 [facility] Patient Information 2012 [place], [place].  Syncope You have had a fainting (syncopal) spell. A fainting episode is a sudden, short-lived loss of consciousness. It results in complete recovery. It occurs because there has been a temporary shortage of oxygen and/or sugar (glucose) to the brain. CAUSES   Blood pressure pills and other medications that may lower blood pressure below normal. Sudden changes in posture (sudden standing).   Over-medication. Take your medications as directed.   Standing too long. This can cause blood to pool in the legs.   Seizure disorders.   Low blood sugar (hypoglycemia) of diabetes. This more commonly causes coma.   Bearing down to go to the bathroom. This can cause your blood pressure to rise suddenly. Your body compensates by making the blood pressure too low when you stop bearing down.   Hardening of the arteries where the brain temporarily does not receive enough blood.   Irregular heart beat and circulatory problems.   Fear, emotional distress, injury, sight of blood, or illness.  Your caregiver will send you home if the syncope was from non-worrisome causes (benign). Depending on your age and health, you may stay to be monitored and observed. If you return home, have someone stay with you if your caregiver feels that is desirable. It is very important to  keep all follow-up referrals and appointments in order to properly manage this condition. This is a serious problem which can lead to serious illness and death if not carefully managed.  WARNING: Do not drive or operate machinery until your caregiver feels that it is safe for you to do so. SEEK IMMEDIATE MEDICAL CARE IF:   You have another fainting episode or faint while lying or sitting down. DO NOT DRIVE YOURSELF. Call 911 if no other help is available.   You have chest pain, are feeling sick to your stomach (nausea), vomiting or abdominal pain.   You have an irregular heartbeat or one that is very fast (pulse over 120 beats per minute).   You have a loss of feeling in some part of your body or lose movement in your arms or legs.   You have difficulty with speech, confusion, severe weakness, or visual problems.   You become sweaty and/or feel light headed.  Make sure you are rechecked as instructed. Document Released: 05/20/2005 Document Revised: 05/09/2011 Document Reviewed: 01/08/2007 [facility] Patient Information 2012 [place], [place].

## 2011-11-11 NOTE — MAU Note (Signed)
Cardiac monitoring applied.

## 2011-11-11 NOTE — MAU Note (Signed)
Pt complains of diarrhea for the last couple of days. Was at work today and started having a headache and not feeling well. States she passed out and all she remembers is waking up with all her coworkers around her.

## 2011-11-11 NOTE — MAU Provider Note (Signed)
History    CSN: [id number] Arrival date and time: 11/11/11 1309  First Provider Initiated Contact with Patient 11/11/11 1403    No chief complaint on file.  HPI Comments: Pt presents following syncopal episode at work.  Pt states she has had diarrhea for 3 days.  She reports having a headache this morning with associated palpitations early this morning.   While at work her headache continued to worsen with associated dizziness and she decided to go home.  While standing up from her desk she was reported by co-workers to have had a syncopal episode with LOC for 5-7 minutes.  Questionable report of distant vs no pulse, with report of chest compressions being performed on scene by by-standers.  Upon EMS arrival pt was awake and conversant.   Pt does not remember events and denies loss of bowel/bladder.  No convulsions reported.   No associated chest pain, or dyspnea.  Pt reports dizziness prior to epidose. History of palpitations in past with no known cardiac source.  Pt reports feeling baby move normally/increased since event occurred.  Pt with history of 3 syncopal events and followed by Dr. Nadara Eaton with extensive workup including Holter/Event monitor + stress test that per patient report were normal.  This has occurred in the past year.  OB History    Grav Para Term Preterm Abortions TAB SAB Ect Mult Living   5 4 4  0 0 0 0 0 0 4     Past Medical History  Diagnosis Date  . Hypertension   . Bilateral ovarian cysts   . OBESITY 12/01/2008    Qualifier: Diagnosis of  By: Sandi Mealy  MD, Judeth Cornfield    . Palpitations 02/28/2010    Qualifier: Diagnosis of  Problem Stop Reason:  By: Lelon Perla MD, Vickki Muff    . RHINITIS, ALLERGIC 07/31/2006    Qualifier: Diagnosis of  By: Knox Royalty     Past Surgical History  Procedure Date  . Laparoscopy    Family History  Problem Relation Age of Onset  . Hypertension Mother   . Hypertension Maternal Aunt    History  Substance Use Topics  . Smoking status: Never  Smoker   . Smokeless tobacco: Never Used  . Alcohol Use: No   Allergies:  Allergies  Allergen Reactions  . Miconazole Nitrate     Patient states she used this twice and "broke out and the symptoms got worse"   Prescriptions prior to admission  Medication Sig Dispense Refill  . acetaminophen (TYLENOL) 500 MG tablet Take 1,000 mg by mouth every 6 (six) hours as needed. for headache      . calcium carbonate (TUMS - DOSED IN MG ELEMENTAL CALCIUM) 500 MG chewable tablet Chew 1 tablet by mouth daily as needed. For heartburn      . Prenatal Vit-Fe Fumarate-FA (PRENATAL MULTIVITAMIN) TABS Take 1 tablet by mouth daily.      . Prenatal Vit-Fe Fumarate-FA (PRENATAL COMPLETE) 14-0.4 MG TABS Take 1 tablet by mouth 1 day or 1 dose.  30 each  0   Review of Systems  Constitutional: Negative for fever and chills.  Eyes: Positive for photophobia (associated with headache prior to episode; improved at this time). Negative for blurred vision and double vision.  Respiratory: Negative for cough, hemoptysis, sputum production, shortness of breath and wheezing.   Cardiovascular: Positive for palpitations and syncope. Negative for chest pain, orthopnea, claudication, leg swelling and PND.  Gastrointestinal: Positive for nausea, vomiting (non-bloody, non-bilious) and diarrhea. Negative for heartburn, abdominal pain,  constipation, blood in stool and melena.  Genitourinary: Negative for dysuria, urgency, frequency, hematuria and flank pain.       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 Negative for dizziness, sensory change, speech change, focal weakness, seizures and weakness.  Endo/Heme/Allergies: Negative.   Psychiatric/Behavioral: Negative.    Physical Exam   Blood pressure 119/82, pulse 89, temperature 98.9 F (37.2 C), temperature source Oral, resp. rate 18, last menstrual period  05/21/2011.  Physical Exam  Nursing note and vitals reviewed. Constitutional: She appears well-developed and well-nourished. No distress.  HENT:  Head: Normocephalic and atraumatic.       Dry mucous membranes, no tonsillar exudate  Eyes: Conjunctivae and EOM are normal. Right eye exhibits no discharge. Left eye exhibits no discharge. No scleral icterus.  Neck: No tracheal deviation present. No thyromegaly present.  Cardiovascular: Normal rate, regular rhythm, normal heart sounds and intact distal pulses.  Exam reveals no gallop and no friction rub.   No murmur heard. Respiratory: Effort normal and breath sounds normal. No respiratory distress. She has no wheezes. She has no rales. She exhibits no tenderness (no bruising or defect noted).  GI: Soft. She exhibits distension and mass. There is no tenderness. There is no rebound and no guarding.       Gravid; appropriate for gestational age  Genitourinary:       Deferred  Musculoskeletal: She exhibits no edema.       C1 Rotated R with TTP over R lateral body of C1; no crepitus on TMJ exam  Neurological: She is alert. No cranial nerve deficit. She exhibits normal muscle tone.  Skin: Skin is warm and dry. No rash noted. She is not diaphoretic. No erythema. No pallor.  Psychiatric: She has a normal mood and affect. Her behavior is normal. Judgment and thought content normal.    MAU Course  Procedures  MDM Slight Dehydration indicated by mild hyponatremia and hypokalemia.  Likely related to diarrhea.  Provided IV Fluids and encouraged to drink.  Poor renal response with spec gravity of <1.005.  No proteinuria; no glucosuria.  Likely spurious value. follow up as outpatient and consider SIADH vs Diabetes Insipidus vs RTA if not improved.   EKG today overall unremarkable.  ?PAC while capturing but overall negative.  Poor bystander reliability for pulse check and story consistent with prior syncopal episodes.    Assessment and Plan  Vasovagal vs  Neurogenic Syncope.  Low likelihood of cardiac source given thorough evaluation within last year.   Orthostatic VS WNL Diarrhea likely due to viral gastroenteritis that will be self limiting.   Consider follow up with Dr. Nadara Eaton for re-evaluation.  Follow up with [facility] tomorrow per Dr. Rana Snare with whom the case was discussed with via telephone.  Pt voices understanding and will call office for appointment.   Kaimana Neuzil Colin Mulders reviewed case and agreed with plan  Andrena Mews, DO [facility] Resident - PGY-1 11/11/2011 4:36 PM  30 yo G5P4004 at [redacted]w[redacted]d inadvertently was assigned to [facility] on arrival in MAU I saw pt with resident and spoke with Dr. Rana Snare. Agree with resident's above documentation. She had no contractions, vaginal bleeding or leakage of fluid. EFM: FHR reassuring for GA  Antonyo Hinderer 11/11/2011 5:11 PM

## 2011-11-11 NOTE — Progress Notes (Signed)
Cardiac monitoring applied to pt.

## 2012-01-13 ENCOUNTER — Encounter (HOSPITAL_COMMUNITY): Payer: Self-pay | Admitting: *Deleted

## 2012-01-13 ENCOUNTER — Inpatient Hospital Stay (HOSPITAL_COMMUNITY)
Admission: AD | Admit: 2012-01-13 | Discharge: 2012-01-14 | Disposition: A | Discharge status: Hospice | Payer: Managed Care, Other (non HMO) | Source: Ambulatory Visit | Attending: Obstetrics and Gynecology | Admitting: Obstetrics and Gynecology

## 2012-01-13 DIAGNOSIS — O4700 False labor before 37 completed weeks of gestation, unspecified trimester: Secondary | ICD-10-CM | POA: Insufficient documentation

## 2012-01-13 DIAGNOSIS — O479 False labor, unspecified: Secondary | ICD-10-CM

## 2012-01-13 LAB — FETAL FIBRONECTIN: Fetal Fibronectin: NEGATIVE

## 2012-01-13 LAB — URINALYSIS, ROUTINE W REFLEX MICROSCOPIC
Bilirubin Urine: NEGATIVE
Hgb urine dipstick: NEGATIVE
Ketones, ur: 15 mg/dL — AB
Nitrite: NEGATIVE
Specific Gravity, Urine: <1.005 — ABNORMAL LOW (ref 1.005–1.030)
Urobilinogen, UA: 0.2 mg/dL (ref 0.0–1.0)

## 2012-01-13 LAB — URINE MICROSCOPIC-ADD ON

## 2012-01-13 LAB — WET PREP, GENITAL: Trich, Wet Prep: NONE SEEN

## 2012-01-13 MED ORDER — BUTORPHANOL TARTRATE 1 MG/ML IJ SOLN
1.0000 mg | Freq: Once | INTRAMUSCULAR | Status: AC
  Administered 2012-01-13: 1 mg via INTRAVENOUS
  Filled 2012-01-13: qty 1

## 2012-01-13 MED ORDER — LACTATED RINGERS IV BOLUS (SEPSIS)
1000.0000 mL | Freq: Once | INTRAVENOUS | Status: AC
  Administered 2012-01-13: 1000 mL via INTRAVENOUS
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
## 2012-01-13 NOTE — MAU Note (Signed)
Pt reports contractions off/on x 2 days, worsening today, pressure in rectum.
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
## 2012-01-14 DIAGNOSIS — O479 False labor, unspecified: Secondary | ICD-10-CM

## 2012-01-14 LAB — GC/CHLAMYDIA PROBE AMP, GENITAL
Chlamydia, DNA Probe: NEGATIVE
GC Probe Amp, Genital: NEGATIVE

## 2012-01-15 LAB — URINE CULTURE

## 2012-01-16 LAB — CULTURE, BETA STREP (GROUP B ONLY)

## 2012-02-18 ENCOUNTER — Encounter (HOSPITAL_COMMUNITY): Payer: Self-pay | Admitting: *Deleted

## 2012-02-18 ENCOUNTER — Telehealth (HOSPITAL_COMMUNITY): Payer: Self-pay | Admitting: *Deleted

## 2012-02-18 NOTE — Telephone Encounter (Signed)
Preadmission screen  

## 2012-02-19 ENCOUNTER — Telehealth (HOSPITAL_COMMUNITY): Payer: Self-pay | Admitting: *Deleted

## 2012-02-19 ENCOUNTER — Encounter (HOSPITAL_COMMUNITY): Payer: Self-pay | Admitting: *Deleted

## 2012-02-19 NOTE — Telephone Encounter (Signed)
Preadmission screen  

## 2012-02-23 ENCOUNTER — Inpatient Hospital Stay (HOSPITAL_COMMUNITY)
Admission: AD | Admit: 2012-02-23 | Discharge: 2012-02-23 | Disposition: A | Discharge status: Home / Self Care | Payer: Managed Care, Other (non HMO) | Source: Ambulatory Visit | Attending: Obstetrics and Gynecology | Admitting: Obstetrics and Gynecology

## 2012-02-23 ENCOUNTER — Encounter (HOSPITAL_COMMUNITY): Payer: Self-pay | Admitting: Obstetrics and Gynecology

## 2012-02-23 DIAGNOSIS — O479 False labor, unspecified: Secondary | ICD-10-CM

## 2012-02-23 DIAGNOSIS — B373 Candidiasis of vulva and vagina: Secondary | ICD-10-CM | POA: Insufficient documentation

## 2012-02-23 DIAGNOSIS — O2390 Unspecified genitourinary tract infection in pregnancy, unspecified trimester: Secondary | ICD-10-CM | POA: Insufficient documentation

## 2012-02-23 DIAGNOSIS — B3731 Acute candidiasis of vulva and vagina: Secondary | ICD-10-CM | POA: Insufficient documentation

## 2012-02-23 DIAGNOSIS — N949 Unspecified condition associated with female genital organs and menstrual cycle: Secondary | ICD-10-CM | POA: Insufficient documentation

## 2012-02-23 MED ORDER — FLUCONAZOLE 150 MG PO TABS: 150.0000 mg | ORAL_TABLET | Freq: Once | ORAL | Status: DC

## 2012-02-23 MED ORDER — TRIAMCINOLONE ACETONIDE 0.1 % EX CREA: TOPICAL_CREAM | Freq: Two times a day (BID) | CUTANEOUS | Status: DC

## 2012-02-23 MED ORDER — FLUCONAZOLE 150 MG PO TABS
150.0000 mg | ORAL_TABLET | Freq: Once | ORAL | Status: AC
  Administered 2012-02-23: 150 mg via ORAL
  Filled 2012-02-23: qty 1

## 2012-02-23 NOTE — Progress Notes (Signed)
Pt states she "had a reaction to Miconazole many years ago and has taken Diflucan many times."

## 2012-02-23 NOTE — MAU Note (Signed)
"  I found out that my husband cheated on me in August.  I came here and was tested for STDs and everything was okay.  I was since tested at my doctor's office and they told me I had a bacterial infection.  I have continued to be irritated, itching, and I noticed some bumps on the sides of my vagina.  I Googled it and I read about herpes and how it can affect the baby.  I'm afraid it might be herpes.  I haven't been sexually active with him since all of this happened."

## 2012-02-23 NOTE — MAU Provider Note (Signed)
History     CSN: [id number]  Arrival date and time: 02/23/12 2024   None     No chief complaint on file.  HPI Jenna Mcclure is a 31 y.o. female @ [redacted]w[redacted]d gestation who presents to MAU with vaginal irritation. She states she was evaluated here and had cultures for GC and Chlamydia that were negative and then had follow up in the office and treated with Metrogel for BV. Irritation has gotten worse. She has thick white vaginal discharge. She reports some contractions. The history was provided by the patient.   OB History    Grav Para Term Preterm Abortions TAB SAB Ect Mult Living   5 4 4  0 0 0 0 0 0 4      Past Medical History  Diagnosis Date  . Hypertension   . Bilateral ovarian cysts   . OBESITY 12/01/2008    Qualifier: Diagnosis of  By: Sandi Mealy  MD, Judeth Cornfield    . Palpitations 02/28/2010    Qualifier: Diagnosis of  Problem Stop Reason:  By: Lelon Perla MD, Vickki Muff    . RHINITIS, ALLERGIC 07/31/2006    Qualifier: Diagnosis of  By: Knox Royalty    . Abnormal Pap smear     normal for the last 6 yrs  . Hx of gonorrhea   . GERD (gastroesophageal reflux disease)   . Heart murmur     staes has heart murmur but - echo and - EKG    Past Surgical History  Procedure Date  . Laparoscopy     Family History  Problem Relation Age of Onset  . Hypertension Mother   . Hypertension Maternal Aunt   . Diabetes Maternal Uncle   . Diabetes Maternal Grandmother   . Diabetes Paternal Grandmother     History  Substance Use Topics  . Smoking status: Never Smoker   . Smokeless tobacco: Never Used  . Alcohol Use: No    Allergies:  Allergies  Allergen Reactions  . Miconazole Nitrate     Patient states she used this twice and "broke out and the symptoms got worse"    Prescriptions prior to admission  Medication Sig Dispense Refill  . acetaminophen (TYLENOL) 500 MG tablet Take 1,000 mg by mouth every 6 (six) hours as needed. for headache      . calcium carbonate (TUMS - DOSED IN MG ELEMENTAL  CALCIUM) 500 MG chewable tablet Chew 1 tablet by mouth daily as needed. For heartburn      . famotidine (PEPCID AC) 10 MG chewable tablet Chew 10 mg by mouth daily as needed. Stomach acid      . metroNIDAZOLE (METROGEL) 0.75 % vaginal gel Place 1 Applicatorful vaginally at bedtime. Started 02/19/12 for 5 days      . Prenatal Vit-Fe Fumarate-FA (PRENATAL COMPLETE) 14-0.4 MG TABS Take 1 tablet by mouth 1 day or 1 dose.  30 each  0    ROS: As stated in HPI Physical Exam   Blood pressure 137/75, pulse 80, temperature 98 F (36.7 C), temperature source Oral, resp. rate 18, height 5\' 2"  (1.575 m), weight 196 lb 6.4 oz (89.086 kg), last menstrual period 05/21/2011.  Physical Exam  Nursing note and vitals reviewed. Constitutional: She is oriented to person, place, and time. She appears well-developed and well-nourished. No distress.  HENT:  Head: Normocephalic and atraumatic.  Eyes: EOM are normal.  Neck: Neck supple.  Cardiovascular: Normal rate.   Respiratory: Effort normal.  GI: Soft. There is no tenderness.  Gravid consistent with dates  Genitourinary:       External genitalia without lesions noted. Vaginal mucosa with erythema. Thick, white, cheesy discharge vaginal vault.   Cervix posterior, closed.  Musculoskeletal: Normal range of motion.  Neurological: She is alert and oriented to person, place, and time.  Skin: Skin is warm and dry.  Psychiatric: She has a normal mood and affect. Her behavior is normal. Judgment and thought content normal.   Monitor tracing reactive, baseline 145 mild contractions, no decelerations  Assessment:  31 y.o. female @ [redacted]w[redacted]d gestation with monilia vaginitis    Braxton Hicks contractions  Plan:  Diflucan 150 mg. PO    Follow up in the office Discussed with the patient and all questioned fully answered. She will follow up in the office or return here as needed.   Medication List     As of 02/23/2012 11:01 PM    START taking these medications           fluconazole 150 MG tablet   Commonly known as: DIFLUCAN   Take 1 tablet (150 mg total) by mouth once.      triamcinolone cream 0.1 %   Commonly known as: KENALOG   Apply topically 2 (two) times daily.      CONTINUE taking these medications         acetaminophen 500 MG tablet   Commonly known as: TYLENOL      calcium carbonate 500 MG chewable tablet   Commonly known as: TUMS - dosed in mg elemental calcium      famotidine 10 MG chewable tablet   Commonly known as: PEPCID AC      metroNIDAZOLE 0.75 % vaginal gel   Commonly known as: METROGEL      Prenatal Complete 14-0.4 MG Tabs   Take 1 tablet by mouth 1 day or 1 dose.          Where to get your medications    These are the prescriptions that you need to pick up. We sent them to a specific pharmacy, so you will need to go there to get them.   [id number] [place], [place] - [address]    [address] [place] [address]    Phone: [phone]        triamcinolone cream 0.1 %         You may get these medications from any pharmacy.         fluconazole 150 MG tablet            MAU Course  Procedures  Icelyn Navarrete, RN, FNP, [facility], [place] 02/23/2012, 9:27 PM

## 2012-02-26 ENCOUNTER — Inpatient Hospital Stay (HOSPITAL_COMMUNITY)
Admission: AD | Admit: 2012-02-26 | Discharge: 2012-02-26 | Disposition: A | Discharge status: Home / Self Care | Payer: Managed Care, Other (non HMO) | Source: Ambulatory Visit | Attending: Obstetrics and Gynecology | Admitting: Obstetrics and Gynecology

## 2012-02-26 DIAGNOSIS — O479 False labor, unspecified: Secondary | ICD-10-CM | POA: Insufficient documentation

## 2012-02-26 NOTE — MAU Note (Signed)
0400- woke up went to the bathroom, stuff was coming out, water and mucous.  Has been contracting every .  Was 4 cm yesterday (Grewal). None since, noted some blood when used restroom

## 2012-02-26 NOTE — Progress Notes (Signed)
Dr Marcelle Overlie notified of patient, her c/o bloody discharge and contractions. Tracing, ctx pattern and sve result. Order to discharge with labvor precautions.

## 2012-02-28 ENCOUNTER — Inpatient Hospital Stay (HOSPITAL_COMMUNITY): Payer: Managed Care, Other (non HMO) | Admitting: Anesthesiology

## 2012-02-28 ENCOUNTER — Encounter (HOSPITAL_COMMUNITY): Payer: Self-pay | Admitting: *Deleted

## 2012-02-28 ENCOUNTER — Encounter (HOSPITAL_COMMUNITY): Payer: Self-pay | Admitting: Anesthesiology

## 2012-02-28 ENCOUNTER — Inpatient Hospital Stay (HOSPITAL_COMMUNITY): Payer: Managed Care, Other (non HMO)

## 2012-02-28 ENCOUNTER — Inpatient Hospital Stay (HOSPITAL_COMMUNITY)
Admission: AD | Admit: 2012-02-28 | Discharge: 2012-03-01 | DRG: 775 | Disposition: A | Discharge status: Home / Self Care | Payer: Managed Care, Other (non HMO) | Source: Ambulatory Visit | Attending: Obstetrics and Gynecology | Admitting: Obstetrics and Gynecology

## 2012-02-28 ENCOUNTER — Encounter (HOSPITAL_COMMUNITY): Payer: Self-pay

## 2012-02-28 LAB — CBC
HCT: 37.5 % (ref 36.0–46.0)
MCH: 31.6 pg (ref 26.0–34.0)
MCHC: 34.4 g/dL (ref 30.0–36.0)
MCV: 91.9 fL (ref 78.0–100.0)
Platelets: 170 10*3/uL (ref 150–400)
RDW: 14.1 % (ref 11.5–15.5)
WBC: 13.5 10*3/uL — ABNORMAL HIGH (ref 4.0–10.5)

## 2012-02-28 MED ORDER — OXYTOCIN BOLUS FROM INFUSION
500.0000 mL | Freq: Once | INTRAVENOUS | Status: DC
  Filled 2012-02-28: qty 500

## 2012-02-28 MED ORDER — CITRIC ACID-SODIUM CITRATE 334-500 MG/5ML PO SOLN: 30.0000 mL | ORAL | Status: DC | PRN

## 2012-02-28 MED ORDER — LACTATED RINGERS IV SOLN: 500.0000 mL | Freq: Once | INTRAVENOUS | Status: DC

## 2012-02-28 MED ORDER — OXYTOCIN 40 UNITS IN LACTATED RINGERS INFUSION - SIMPLE MED
62.5000 mL/h | Freq: Once | INTRAVENOUS | Status: DC
  Filled 2012-02-28: qty 1000

## 2012-02-28 MED ORDER — PHENYLEPHRINE 40 MCG/ML (10ML) SYRINGE FOR IV PUSH (FOR BLOOD PRESSURE SUPPORT): 80.0000 ug | PREFILLED_SYRINGE | INTRAVENOUS | Status: DC | PRN

## 2012-02-28 MED ORDER — TERBUTALINE SULFATE 1 MG/ML IJ SOLN: 0.2500 mg | Freq: Once | INTRAMUSCULAR | Status: DC | PRN

## 2012-02-28 MED ORDER — TETANUS-DIPHTH-ACELL PERTUSSIS 5-2.5-18.5 LF-MCG/0.5 IM SUSP: 0.5000 mL | Freq: Once | INTRAMUSCULAR | Status: DC

## 2012-02-28 MED ORDER — LIDOCAINE HCL (PF) 1 % IJ SOLN
30.0000 mL | INTRAMUSCULAR | Status: DC | PRN
  Filled 2012-02-28: qty 30

## 2012-02-28 MED ORDER — DIBUCAINE 1 % RE OINT: 1.0000 "application " | TOPICAL_OINTMENT | RECTAL | Status: DC | PRN

## 2012-02-28 MED ORDER — SODIUM CHLORIDE 0.9 % IV SOLN: 250.0000 mL | INTRAVENOUS | Status: DC | PRN

## 2012-02-28 MED ORDER — PRENATAL MULTIVITAMIN CH
1.0000 | ORAL_TABLET | Freq: Every day | ORAL | Status: DC
  Administered 2012-02-29 – 2012-03-01 (×2): 1 via ORAL
  Filled 2012-02-28 (×2): qty 1

## 2012-02-28 MED ORDER — SODIUM CHLORIDE 0.9 % IJ SOLN: 3.0000 mL | INTRAMUSCULAR | Status: DC | PRN

## 2012-02-28 MED ORDER — OXYCODONE-ACETAMINOPHEN 5-325 MG PO TABS: 1.0000 | ORAL_TABLET | ORAL | Status: DC | PRN

## 2012-02-28 MED ORDER — MEDROXYPROGESTERONE ACETATE 150 MG/ML IM SUSP: 150.0000 mg | INTRAMUSCULAR | Status: DC | PRN

## 2012-02-28 MED ORDER — IBUPROFEN 600 MG PO TABS: 600.0000 mg | ORAL_TABLET | Freq: Four times a day (QID) | ORAL | Status: DC | PRN

## 2012-02-28 MED ORDER — SENNOSIDES-DOCUSATE SODIUM 8.6-50 MG PO TABS
2.0000 | ORAL_TABLET | Freq: Every day | ORAL | Status: DC
  Administered 2012-02-29: 2 via ORAL

## 2012-02-28 MED ORDER — ONDANSETRON HCL 4 MG/2ML IJ SOLN: 4.0000 mg | INTRAMUSCULAR | Status: DC | PRN

## 2012-02-28 MED ORDER — OXYTOCIN 40 UNITS IN LACTATED RINGERS INFUSION - SIMPLE MED
1.0000 m[IU]/min | INTRAVENOUS | Status: DC
  Administered 2012-02-28: 2 m[IU]/min via INTRAVENOUS

## 2012-02-28 MED ORDER — LANOLIN HYDROUS EX OINT: TOPICAL_OINTMENT | CUTANEOUS | Status: DC | PRN

## 2012-02-28 MED ORDER — BUTORPHANOL TARTRATE 1 MG/ML IJ SOLN
1.0000 mg | INTRAMUSCULAR | Status: DC | PRN
  Administered 2012-02-28: 1 mg via INTRAVENOUS
  Filled 2012-02-28: qty 1

## 2012-02-28 MED ORDER — IBUPROFEN 600 MG PO TABS
600.0000 mg | ORAL_TABLET | Freq: Four times a day (QID) | ORAL | Status: DC
  Administered 2012-02-28 – 2012-03-01 (×6): 600 mg via ORAL
  Filled 2012-02-28 (×7): qty 1

## 2012-02-28 MED ORDER — EPHEDRINE 5 MG/ML INJ: 10.0000 mg | INTRAVENOUS | Status: DC | PRN

## 2012-02-28 MED ORDER — ACETAMINOPHEN 325 MG PO TABS: 650.0000 mg | ORAL_TABLET | ORAL | Status: DC | PRN

## 2012-02-28 MED ORDER — OXYCODONE-ACETAMINOPHEN 5-325 MG PO TABS
1.0000 | ORAL_TABLET | ORAL | Status: DC | PRN
  Administered 2012-02-28 – 2012-02-29 (×3): 1 via ORAL
  Filled 2012-02-28 (×3): qty 1

## 2012-02-28 MED ORDER — FENTANYL 2.5 MCG/ML BUPIVACAINE 1/10 % EPIDURAL INFUSION (WH - ANES): 14.0000 mL/h | INTRAMUSCULAR | Status: DC

## 2012-02-28 MED ORDER — DIPHENHYDRAMINE HCL 25 MG PO CAPS: 25.0000 mg | ORAL_CAPSULE | Freq: Four times a day (QID) | ORAL | Status: DC | PRN

## 2012-02-28 MED ORDER — LACTATED RINGERS IV SOLN: INTRAVENOUS | Status: DC

## 2012-02-28 MED ORDER — SODIUM CHLORIDE 0.9 % IJ SOLN: 3.0000 mL | Freq: Two times a day (BID) | INTRAMUSCULAR | Status: DC

## 2012-02-28 MED ORDER — WITCH HAZEL-GLYCERIN EX PADS: 1.0000 "application " | MEDICATED_PAD | CUTANEOUS | Status: DC | PRN

## 2012-02-28 MED ORDER — ONDANSETRON HCL 4 MG/2ML IJ SOLN: 4.0000 mg | Freq: Four times a day (QID) | INTRAMUSCULAR | Status: DC | PRN

## 2012-02-28 MED ORDER — SIMETHICONE 80 MG PO CHEW: 80.0000 mg | CHEWABLE_TABLET | ORAL | Status: DC | PRN

## 2012-02-28 MED ORDER — DIPHENHYDRAMINE HCL 50 MG/ML IJ SOLN: 12.5000 mg | INTRAMUSCULAR | Status: DC | PRN

## 2012-02-28 MED ORDER — BENZOCAINE-MENTHOL 20-0.5 % EX AERO: 1.0000 "application " | INHALATION_SPRAY | CUTANEOUS | Status: DC | PRN

## 2012-02-28 MED ORDER — ONDANSETRON HCL 4 MG PO TABS: 4.0000 mg | ORAL_TABLET | ORAL | Status: DC | PRN

## 2012-02-28 MED ORDER — MEASLES, MUMPS & RUBELLA VAC ~~LOC~~ INJ
0.5000 mL | INJECTION | Freq: Once | SUBCUTANEOUS | Status: DC
  Filled 2012-02-28: qty 0.5

## 2012-02-28 MED ORDER — LACTATED RINGERS IV SOLN: 500.0000 mL | INTRAVENOUS | Status: DC | PRN

## 2012-02-28 NOTE — MAU Note (Signed)
Report given to CN on BS. Patient will be throughput to room 175.

## 2012-02-28 NOTE — Progress Notes (Signed)
SVD of vigerous female infant w/ apgars of 8,9.  Nuchal cord x 1 easily reduced. Placenta delivered spontaneous w/ 3VC.    Fundus firm.  EBL 350cc .  Mom and baby doing well in LDR

## 2012-02-28 NOTE — H&P (Signed)
31yo G5P4 @ 38+5 wks presents w/ SROM & regular ctx.  Pregnancy uncomplicated.  Past History - see hollister, GBS neg  AF, VSS Gen NAD Abd - gravid, NT Cvx - 2cm per RN on admit  [place] - vtx  A/P:  ADmit Exp mngt GBS neg

## 2012-02-28 NOTE — Anesthesia Preprocedure Evaluation (Signed)
Anesthesia Evaluation  Patient identified by MRN, date of birth, ID band Patient awake    Reviewed: Allergy & Precautions, H&P , NPO status , Patient's Chart, lab work & pertinent test results  Airway Mallampati: II TM Distance: >3 FB Neck ROM: full    Dental No notable dental hx.    Pulmonary neg pulmonary ROS,    Pulmonary exam normal       Cardiovascular     Neuro/Psych negative neurological ROS     GI/Hepatic Neg liver ROS,   Endo/Other  Morbid obesity  Renal/GU negative Renal ROS  negative genitourinary   Musculoskeletal negative musculoskeletal ROS (+)   Abdominal Normal abdominal exam  (+)   Peds  Hematology negative hematology ROS (+)   Anesthesia Other Findings   Reproductive/Obstetrics (+) Pregnancy                           Anesthesia Physical Anesthesia Plan  ASA: III  Anesthesia Plan: Epidural   Post-op Pain Management:    Induction:   Airway Management Planned:   Additional Equipment:   Intra-op Plan:   Post-operative Plan:   Informed Consent: I have reviewed the patients History and Physical, chart, labs and discussed the procedure including the risks, benefits and alternatives for the proposed anesthesia with the patient or authorized representative who has indicated his/her understanding and acceptance.     Plan Discussed with:   Anesthesia Plan Comments:         Anesthesia Quick Evaluation

## 2012-02-28 NOTE — MAU Note (Signed)
Patient states she had SROM at 1000 with clear fluid. States contractions every 6 minutes. Fetal heart rate in triage 145. Patients clothing saturated with clear fluid.

## 2012-02-29 ENCOUNTER — Encounter (HOSPITAL_COMMUNITY)
Admission: AD | Disposition: A | Discharge status: Home / Self Care | Payer: Self-pay | Source: Ambulatory Visit | Attending: Obstetrics and Gynecology

## 2012-02-29 LAB — CBC
Hemoglobin: 11.6 g/dL — ABNORMAL LOW (ref 12.0–15.0)
MCHC: 34 g/dL (ref 30.0–36.0)
Platelets: 174 10*3/uL (ref 150–400)
RDW: 14 % (ref 11.5–15.5)

## 2012-02-29 LAB — SURGICAL PCR SCREEN
MRSA, PCR: NEGATIVE
Staphylococcus aureus: NEGATIVE

## 2012-02-29 SURGERY — LIGATION, FALLOPIAN TUBE, POSTPARTUM
Anesthesia: Regional | Laterality: Bilateral

## 2012-02-29 MED ORDER — INFLUENZA VIRUS VACC SPLIT PF IM SUSP
0.5000 mL | INTRAMUSCULAR | Status: AC
  Administered 2012-03-01: 0.5 mL via INTRAMUSCULAR
  Filled 2012-02-29: qty 0.5

## 2012-02-29 MED ORDER — LACTATED RINGERS IV SOLN
INTRAVENOUS | Status: DC
  Administered 2012-02-29: 10 mL/h via INTRAVENOUS

## 2012-02-29 MED ORDER — METOCLOPRAMIDE HCL 10 MG PO TABS
10.0000 mg | ORAL_TABLET | Freq: Once | ORAL | Status: AC
  Administered 2012-02-29: 10 mg via ORAL
  Filled 2012-02-29: qty 1

## 2012-02-29 MED ORDER — RHO D IMMUNE GLOBULIN 1500 UNIT/2ML IJ SOLN
300.0000 ug | Freq: Once | INTRAMUSCULAR | Status: AC
  Administered 2012-02-29: 300 ug via INTRAMUSCULAR
  Filled 2012-02-29: qty 2

## 2012-02-29 MED ORDER — FAMOTIDINE 20 MG PO TABS
40.0000 mg | ORAL_TABLET | Freq: Once | ORAL | Status: AC
  Administered 2012-02-29: 40 mg via ORAL
  Filled 2012-02-29: qty 2

## 2012-02-29 NOTE — Clinical Social Work Note (Signed)
CSW spoke to MOB briefly.  No current concerns with depression or anxiety.  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    Patient was referred for history of depression/anxiety.  * Referral screened out by Clinical Social Worker because none of the following criteria appear to apply: ~ History of anxiety/depression during this pregnancy, or of post-partum depression. ~ Diagnosis of anxiety and/or depression within last 3 years ~ History of depression due to pregnancy loss/loss of child  OR * Patient's symptoms currently being treated with medication and/or therapy. Please contact the Clinical Social Worker if needs arise, or by the patient's request. 

## 2012-02-29 NOTE — Progress Notes (Signed)
Patient verbalizes that the physician has not discussed the PP BTL procedure with her.  According to number 3 on the permit, there are things that have to be discussed with the patient.  Unable to get her to sign consent until those areas are addressed.

## 2012-02-29 NOTE — Progress Notes (Signed)
Post Partum Day 1 Subjective: no complaints.  Pt desires sterilization.  Discussed PP BTL/salpingectomy, interval L/S BTL, vs Essure.  Discussed R/B of all procedures and all questions answered.  Pt elects to receive depo prior to discharge and interval Essure.  Objective: Blood pressure 125/70, pulse 83, temperature 98.7 F (37.1 C), temperature source Oral, resp. rate 18, height 5\' 2"  (1.575 m), weight 87.091 kg (192 lb), last menstrual period 05/21/2011, unknown if currently breastfeeding.  Physical Exam:  General: alert and cooperative Lochia: appropriate Uterine Fundus: firm Incision: n/a DVT Evaluation: No evidence of DVT seen on physical exam.   Basename 02/29/12 0630 02/28/12 1150  HGB 11.6* 12.9  HCT 34.1* 37.5    Assessment/Plan: Plan for discharge tomorrow, Breastfeeding and Lactation consult   LOS: 1 day   Petrina Melby 02/29/2012, 7:45 AM

## 2012-03-01 ENCOUNTER — Inpatient Hospital Stay (HOSPITAL_COMMUNITY): Admission: RE | Admit: 2012-03-01 | Payer: Managed Care, Other (non HMO) | Source: Ambulatory Visit

## 2012-03-01 LAB — RH IG WORKUP (INCLUDES ABO/RH)
Antibody Screen: POSITIVE
Unit division: 0

## 2012-03-01 MED ORDER — IBUPROFEN 600 MG PO TABS: 600.0000 mg | ORAL_TABLET | Freq: Four times a day (QID) | ORAL | Status: DC

## 2012-03-01 MED ORDER — NORETHINDRONE 0.35 MG PO TABS: 1.0000 | ORAL_TABLET | Freq: Every day | ORAL | Status: DC

## 2012-03-01 MED ORDER — OXYCODONE-ACETAMINOPHEN 5-325 MG PO TABS: 1.0000 | ORAL_TABLET | ORAL | Status: DC | PRN

## 2012-03-01 NOTE — Discharge Summary (Signed)
Obstetric Discharge Summary Reason for Admission: onset of labor Prenatal Procedures: ultrasound Intrapartum Procedures: spontaneous vaginal delivery Postpartum Procedures: none Complications-Operative and Postpartum: none Hemoglobin  Date Value Range Status  02/29/2012 11.6* 12.0 - 15.0 g/dL Final     HCT  Date Value Range Status  02/29/2012 34.1* 36.0 - 46.0 % Final    Physical Exam:  General: alert and cooperative Lochia: appropriate Uterine Fundus: firm Incision: n/a DVT Evaluation: No evidence of DVT seen on physical exam.  Discharge Diagnoses: Term Pregnancy-delivered  Discharge Information: Date: 03/01/2012 Activity: pelvic rest Diet: routine Medications: None, Ibuprofen and Percocet Condition: stable Instructions: refer to practice specific booklet Discharge to: home Follow-up Information    Schedule an appointment as soon as possible for a visit in 4 weeks to follow up.         Newborn Data: Live born female  Birth Weight: 8 lb 2.9 oz (3710 g) APGAR: 8, 9  Home with mother.  Jenna Mcclure 03/01/2012, 9:19 AM

## 2012-04-18 ENCOUNTER — Telehealth: Payer: Self-pay | Admitting: Family Medicine

## 2012-04-18 NOTE — Telephone Encounter (Signed)
Patient calling the off hours emergency line with symptoms of headache, sore throat, pain with swallowing and coughing. Symptoms since yesterday. No fever, but some sweating. Son sick at home. Took some tylenol yesterday for headache which helped. She is calling because she is breastfeeding a 93 week old baby and wanted to know what was safe.  I reviewed OTC medications and recommended topical chloraceptic (swish and spit out or spray). Since the systemic effects of a lot of over the counter anelgesics are not fully known for breastfeeding, I recommended that she is avoid lozenges. Also recommended salt and water gargling. Continue tylenol for headache. Patient expressed understanding.    Marena Chancy, PGY-2  Family Medicine Resident

## 2012-05-04 ENCOUNTER — Encounter (HOSPITAL_BASED_OUTPATIENT_CLINIC_OR_DEPARTMENT_OTHER): Payer: Self-pay | Admitting: *Deleted

## 2012-05-04 ENCOUNTER — Telehealth: Payer: Self-pay | Admitting: Family Medicine

## 2012-05-04 ENCOUNTER — Emergency Department (HOSPITAL_BASED_OUTPATIENT_CLINIC_OR_DEPARTMENT_OTHER)
Admission: EM | Admit: 2012-05-04 | Discharge: 2012-05-04 | Disposition: A | Discharge status: Home / Self Care | Payer: Managed Care, Other (non HMO) | Attending: Emergency Medicine | Admitting: Emergency Medicine

## 2012-05-04 ENCOUNTER — Emergency Department (HOSPITAL_BASED_OUTPATIENT_CLINIC_OR_DEPARTMENT_OTHER): Payer: Managed Care, Other (non HMO)

## 2012-05-04 DIAGNOSIS — M79606 Pain in leg, unspecified: Secondary | ICD-10-CM

## 2012-05-04 DIAGNOSIS — Z8679 Personal history of other diseases of the circulatory system: Secondary | ICD-10-CM | POA: Insufficient documentation

## 2012-05-04 DIAGNOSIS — E669 Obesity, unspecified: Secondary | ICD-10-CM | POA: Insufficient documentation

## 2012-05-04 DIAGNOSIS — I10 Essential (primary) hypertension: Secondary | ICD-10-CM | POA: Insufficient documentation

## 2012-05-04 DIAGNOSIS — Z8619 Personal history of other infectious and parasitic diseases: Secondary | ICD-10-CM | POA: Insufficient documentation

## 2012-05-04 DIAGNOSIS — Z8719 Personal history of other diseases of the digestive system: Secondary | ICD-10-CM | POA: Insufficient documentation

## 2012-05-04 DIAGNOSIS — Z79899 Other long term (current) drug therapy: Secondary | ICD-10-CM | POA: Insufficient documentation

## 2012-05-04 DIAGNOSIS — M79609 Pain in unspecified limb: Secondary | ICD-10-CM | POA: Insufficient documentation

## 2012-05-04 DIAGNOSIS — Z791 Long term (current) use of non-steroidal anti-inflammatories (NSAID): Secondary | ICD-10-CM | POA: Insufficient documentation

## 2012-05-04 MED ORDER — IBUPROFEN 800 MG PO TABS: 800.0000 mg | ORAL_TABLET | Freq: Three times a day (TID) | ORAL | Status: DC | PRN

## 2012-05-04 MED ORDER — IBUPROFEN 800 MG PO TABS
ORAL_TABLET | ORAL | Status: AC
  Filled 2012-05-04: qty 1

## 2012-05-04 MED ORDER — IBUPROFEN 800 MG PO TABS
800.0000 mg | ORAL_TABLET | Freq: Once | ORAL | Status: AC
  Administered 2012-05-04: 800 mg via ORAL

## 2012-05-04 NOTE — ED Notes (Signed)
Pt c/o right leg pain ? DVT sent here from [facility]

## 2012-05-04 NOTE — ED Notes (Signed)
Patient transported to Ultrasound 

## 2012-05-04 NOTE — Telephone Encounter (Signed)
Pt reports she is a patient of Dr. Mikel Cella, although no notes in the chart.  Had baby on 02/28/12.  Is getting Essure done later this month, is currently on birth control pill to thin out her uterine lining that were started by her OB/Gyn 1-2 weeks ago.  Is having pain in her leg x1-2 weeks but has progressively gotten worse, very bad today.  Feels like right thigh might be swollen, but no calf swelling.  Is a non-smoker.  Pain is now from her leg into her back.  Advised pt she should go to ER to have ultrasound to r/o DVT.  If unable to get to ER tonight, should be seen by either [facility] or ED tomorrow.  Pt voiced understanding and reported she would go to the ER tonight due to the severity of pain.

## 2012-05-04 NOTE — ED Provider Notes (Signed)
History  This chart was scribed for Charles B. Bernette Mayers, MD by Donne Anon, ED Scribe. This patient was seen in room MH02/MH02 and the patient's care was started at 18:15.  CSN: [id number]  Arrival date & time 05/04/12  1815   First MD Initiated Contact with Patient 05/04/12 1849      Chief Complaint  Patient presents with  . Leg Pain     The history is provided by the patient. No language interpreter was used.   Jenna Mcclure is a 31 y.o. female who presents to the Emergency Department complaining gradual onset, aching, right leg pain that originates in the thigh and radiates down the leg,which has been ongoing for 2 weeks. The patient was advised to go to the ED from her OB office due to her recently being started on birth control pills. She reports associated edema in leg and lower back. She has a h/o a pinched nerve, but states it did not feel like this. She denies dysuria, numbness or tingling, calf pain, CP, or SOB. She has taken ibuprofen with little relief of symptoms. She recently had a baby and is still breast feeding.   Past Medical History  Diagnosis Date  . Hypertension   . Bilateral ovarian cysts   . OBESITY 12/01/2008    Qualifier: Diagnosis of  By: Sandi Mealy  MD, Judeth Cornfield    . Palpitations 02/28/2010    Qualifier: Diagnosis of  Problem Stop Reason:  By: Lelon Perla MD, Vickki Muff    . RHINITIS, ALLERGIC 07/31/2006    Qualifier: Diagnosis of  By: Knox Royalty    . Abnormal Pap smear     normal for the last 6 yrs  . Hx of gonorrhea   . GERD (gastroesophageal reflux disease)   . Heart murmur     staes has heart murmur but - echo and - EKG    Past Surgical History  Procedure Date  . Laparoscopy     Family History  Problem Relation Age of Onset  . Hypertension Mother   . Hypertension Maternal Aunt   . Diabetes Maternal Uncle   . Diabetes Maternal Grandmother   . Diabetes Paternal Grandmother     History  Substance Use Topics  . Smoking status: Never Smoker   .  Smokeless tobacco: Never Used  . Alcohol Use: No    OB History    Grav Para Term Preterm Abortions TAB SAB Ect Mult Living   5 5 5  0 0 0 0 0 0 5      Review of Systems A complete 10 system review of systems was obtained and all systems are negative except as noted in the HPI and PMH.   Allergies  Miconazole nitrate  Home Medications   Current Outpatient Rx  Name  Route  Sig  Dispense  Refill  . CALCIUM CARBONATE ANTACID 500 MG PO CHEW   Oral   Chew 1 tablet by mouth daily as needed. For heartburn         . IBUPROFEN 600 MG PO TABS   Oral   Take 1 tablet (600 mg total) by mouth every 6 (six) hours.   30 tablet   0   . NORETHINDRONE 0.35 MG PO TABS   Oral   Take 1 tablet (0.35 mg total) by mouth daily.   1 Package   11   . OXYCODONE-ACETAMINOPHEN 5-325 MG PO TABS   Oral   Take 1-2 tablets by mouth every 3 (three) hours as needed (moderate -  severe pain).   30 tablet   0   . PRENATAL MULTIVITAMIN CH   Oral   Take 1 tablet by mouth daily.         . TRIAMCINOLONE ACETONIDE 0.1 % EX CREA   Topical   Apply topically 2 (two) times daily.   30 g   0     Triage Vitals BP 135/92  Pulse 78  Temp 98.7 F (37.1 C) (Oral)  Resp 16  Ht 5\' 2"  (1.575 m)  Wt 156 lb (70.761 kg)  BMI 28.53 kg/m2  SpO2 99%  LMP 04/08/2012  Breastfeeding? Yes  Physical Exam  Nursing note and vitals reviewed. Constitutional: She is oriented to person, place, and time. She appears well-developed and well-nourished.  HENT:  Head: Normocephalic and atraumatic.  Eyes: EOM are normal. Pupils are equal, round, and reactive to light.  Neck: Normal range of motion. Neck supple.  Cardiovascular: Normal rate, normal heart sounds and intact distal pulses.   Pulmonary/Chest: Effort normal and breath sounds normal.  Abdominal: Bowel sounds are normal. She exhibits no distension. There is no tenderness.  Musculoskeletal: Normal range of motion. She exhibits no edema and no tenderness.        No calf tenderness or tenderness over the deep veins.  Neurological: She is alert and oriented to person, place, and time. She has normal strength. No cranial nerve deficit or sensory deficit.  Skin: Skin is warm and dry. No rash noted.  Psychiatric: She has a normal mood and affect.    ED Course  Procedures (including critical care time) DIAGNOSTIC STUDIES: Oxygen Saturation is 99% on room air, normal by my interpretation.    COORDINATION OF CARE: 6:57 PMDiscussed treatment plan which includes an ultrasound with pt at bedside and pt agreed to plan.    Labs Reviewed - No data to display US Venous Img Lower Unilateral Right  05/04/2012  *RADIOLOGY REPORT*  Clinical Data: Leg pain  RIGHT LOWER EXTREMITY VENOUS DUPLEX ULTRASOUND  Technique:  Gray-scale sonography with graded compression, as well as color Doppler and duplex ultrasound were performed to evaluate the deep venous system of the lower extremity from the level of the common femoral vein through the popliteal and proximal calf veins. Spectral Doppler was utilized to evaluate flow at rest and with distal augmentation maneuvers.  Comparison:  None.  Findings:  Normal compressibility of the common femoral, superficial femoral, and popliteal veins is demonstrated, as well as the visualized proximal calf veins.  No filling defects to suggest DVT on grayscale or color Doppler imaging.  Doppler waveforms show normal direction of venous flow, normal respiratory phasicity and response to augmentation.  IMPRESSION: No evidence of lower extremity deep vein thrombosis.   Original Report Authenticated By: Signa Kell, M.D.      No diagnosis found.    MDM  [place] neg as above. Pain possibly due to sciatica given location, advised to continue NSAIDs, avoid meds not safe for lactation  and follow up with PCP.  I personally performed the services described in this documentation, which was scribed in my presence. The recorded information has been  reviewed and is accurate.          Charles B. Bernette Mayers, MD 05/04/12 [phone]

## 2012-07-12 ENCOUNTER — Ambulatory Visit (INDEPENDENT_AMBULATORY_CARE_PROVIDER_SITE_OTHER): Payer: Managed Care, Other (non HMO) | Admitting: Family Medicine

## 2012-07-12 VITALS — BP 120/79 | HR 84 | Temp 98.3°F | Resp 16 | Ht 63.0 in | Wt 153.6 lb

## 2012-07-12 DIAGNOSIS — J020 Streptococcal pharyngitis: Secondary | ICD-10-CM

## 2012-07-12 DIAGNOSIS — M2669 Other specified disorders of temporomandibular joint: Secondary | ICD-10-CM

## 2012-07-12 DIAGNOSIS — M26629 Arthralgia of temporomandibular joint, unspecified side: Secondary | ICD-10-CM

## 2012-07-12 DIAGNOSIS — R51 Headache: Secondary | ICD-10-CM

## 2012-07-12 DIAGNOSIS — M542 Cervicalgia: Secondary | ICD-10-CM

## 2012-07-12 DIAGNOSIS — Z8679 Personal history of other diseases of the circulatory system: Secondary | ICD-10-CM

## 2012-07-12 DIAGNOSIS — K219 Gastro-esophageal reflux disease without esophagitis: Secondary | ICD-10-CM

## 2012-07-12 MED ORDER — RANITIDINE HCL 150 MG PO TABS: 150.0000 mg | ORAL_TABLET | Freq: Two times a day (BID) | ORAL | Status: DC

## 2012-07-12 MED ORDER — CETIRIZINE HCL 10 MG PO TABS: 10.0000 mg | ORAL_TABLET | Freq: Every day | ORAL | Status: DC

## 2012-07-12 MED ORDER — MOMETASONE FUROATE 50 MCG/ACT NA SUSP: 2.0000 | Freq: Every day | NASAL | Status: DC

## 2012-07-12 NOTE — Patient Instructions (Addendum)
Start the zyrtec and nasonex for possible allergy component of symptoms, tylenol or motrin as needed for headache, and see handout on tmj syndrome.  Zantac for possible reflux causing throat symptoms.   Recheck in next 2 weeks - sooner if any worsening.  Your should receive a call or letter about your lab results within the next week to 10 days.  Temporomandibular Problems  Temporomandibular joint (TMJ) dysfunction means there are problems with the joint between your jaw and your skull. This is a joint lined by cartilage like other joints in your body but also has a small disc in the joint which keeps the bones from rubbing on each other. These joints are like other joints and can get inflamed (sore) from arthritis and other problems. When this joint gets sore, it can cause headaches and pain in the jaw and the face. CAUSES  Usually the arthritic types of problems are caused by soreness in the joint. Soreness in the joint can also be caused by overuse. This may come from grinding your teeth. It may also come from mis-alignment in the joint. DIAGNOSIS Diagnosis of this condition can often be made by history and exam. Sometimes your caregiver may need X-rays or an MRI scan to determine the exact cause. It may be necessary to see your dentist to determine if your teeth and jaws are lined up correctly. TREATMENT  Most of the time this problem is not serious; however, sometimes it can persist (become chronic). When this happens medications that will cut down on inflammation (soreness) help. Sometimes a shot of cortisone into the joint will be helpful. If your teeth are not aligned it may help for your dentist to make a splint for your mouth that can help this problem. If no physical problems can be found, the problem may come from tension. If tension is found to be the cause, biofeedback or relaxation techniques may be helpful. HOME CARE INSTRUCTIONS   Later in the day, applications of ice packs may be  helpful. Ice can be used in a plastic bag with a towel around it to prevent frostbite to skin. This may be used about every 2 hours for 20 to 30 minutes, as needed while awake, or as directed by your caregiver.  Only take over-the-counter or prescription medicines for pain, discomfort, or fever as directed by your caregiver.  If physical therapy was prescribed, follow your caregiver's directions.  Wear mouth appliances as directed if they were given. Document Released: 02/12/2001 Document Revised: 08/12/2011 Document Reviewed: 05/22/2008 [facility] Patient Information 2013 [place], [place].

## 2012-07-12 NOTE — Progress Notes (Signed)
Subjective:    Patient ID: Jenna Mcclure, female    DOB: [birth year], 32 y.o.   MRN: [id number]  HPI Jenna Mcclure is a 32 y.o. female Here for multiple concerns today.  Need health form for job completed.  Headache, nasal congestion. Initially noticed early last year, better during the summer.  Started again in November.  Prior treated for sinusitis last spring with multiple antibiotics. Tried nasonex for about 3 months - no relief - just improved on own   Current sx's since November.  Tx: ibuprofen every day for past week for headache and sudafed 1-2 times per week. R sided face pressure and feels pressure in r ear - on and off for months. Feels like started since in new home. Has gas heat, but has carbon monoxide detectors at home.  Has been told may be TMJ.  Seen by dentist for this before. Sore in back of neck, and sore throat or feels swollen.  Clears throat frequently. No nasal congestion, but feels some with swallowing.   Hx of HTN - off meds for 2 years. Is fasting this am.   Stressed at times, feels anxious with work, 5 kids.  Exercises.  Denies depression, no SI. Sleeping ok. No anhedonia.   Review of Systems  Constitutional: Negative for fever and chills.  HENT: Positive for congestion. Negative for ear discharge.   Respiratory: Negative for chest tightness and shortness of breath.   Cardiovascular: Negative for chest pain.  Neurological: Negative for weakness.   As above.     Objective:   Physical Exam  Vitals reviewed. Constitutional: She is oriented to person, place, and time. She appears well-developed and well-nourished. No distress.  HENT:  Head: Normocephalic and atraumatic.  Right Ear: Hearing, tympanic membrane, external ear and ear canal normal.  Left Ear: Hearing, tympanic membrane, external ear and ear canal normal.  Nose: Nose normal.  Mouth/Throat: Oropharynx is clear and moist. No oropharyngeal exudate.  Eyes: Conjunctivae and EOM are normal. Pupils are  equal, round, and reactive to light.  Neck: Normal range of motion. Neck supple.  Cardiovascular: Normal rate, regular rhythm, normal heart sounds and intact distal pulses.   No murmur heard. Pulmonary/Chest: Effort normal and breath sounds normal. No respiratory distress. She has no wheezes. She has no rhonchi.  Abdominal: Soft. There is no tenderness.  Musculoskeletal:  Min spasm posterior neck.  Lymphadenopathy:    She has no cervical adenopathy.  Neurological: She is alert and oriented to person, place, and time.  Skin: Skin is warm and dry. No rash noted.  Psychiatric: She has a normal mood and affect. Her behavior is normal. Judgment and thought content normal.          Assessment & Plan:  Jenna Mcclure is a 32 y.o. female 1. TMJ syndrome    2. Face pain  cetirizine (ZYRTEC) 10 MG tablet   cetirizine (ZYRTEC) 10 MG tablet   mometasone (NASONEX) 50 MCG/ACT nasal spray  3. Headache    4. Neck pain    5. History of hypertension  Lipid panel   Lipid panel  6. Laryngopharyngeal reflux (LPR)  ranitidine (ZANTAC) 150 MG tablet   ranitidine (ZANTAC) 150 MG tablet    Headache, R TMJ syndrome with recurrent sinus pressure and throat swelling sensation, with frequent clearing of throat - suspect LPR and may have allergy component.  No discolored nasal discharge - will hold on abx for now, but if no relief with plan, may need ent  eval.  Start zantac 150mg  BID, zyrtec 10mg  qd, nasonex ns, and h/o for TMJ.  Trial of stretches, and discussed anxiety and stress mgt. Recheck in next 2 weeks.   Hx of HTN - needs lipids for work form.  Off HTN meds for 2 years. Check Lipid panel. Can return with form to complete.    Patient Instructions  Start the zyrtec and nasonex for possible allergy component of symptoms, tylenol or motrin as needed for headache, and see handout on tmj syndrome.  Zantac for possible reflux causing throat symptoms.   Recheck in next 2 weeks - sooner if any worsening.   Your should receive a call or letter about your lab results within the next week to 10 days.  Temporomandibular Problems  Temporomandibular joint (TMJ) dysfunction means there are problems with the joint between your jaw and your skull. This is a joint lined by cartilage like other joints in your body but also has a small disc in the joint which keeps the bones from rubbing on each other. These joints are like other joints and can get inflamed (sore) from arthritis and other problems. When this joint gets sore, it can cause headaches and pain in the jaw and the face. CAUSES  Usually the arthritic types of problems are caused by soreness in the joint. Soreness in the joint can also be caused by overuse. This may come from grinding your teeth. It may also come from mis-alignment in the joint. DIAGNOSIS Diagnosis of this condition can often be made by history and exam. Sometimes your caregiver may need X-rays or an MRI scan to determine the exact cause. It may be necessary to see your dentist to determine if your teeth and jaws are lined up correctly. TREATMENT  Most of the time this problem is not serious; however, sometimes it can persist (become chronic). When this happens medications that will cut down on inflammation (soreness) help. Sometimes a shot of cortisone into the joint will be helpful. If your teeth are not aligned it may help for your dentist to make a splint for your mouth that can help this problem. If no physical problems can be found, the problem may come from tension. If tension is found to be the cause, biofeedback or relaxation techniques may be helpful. HOME CARE INSTRUCTIONS   Later in the day, applications of ice packs may be helpful. Ice can be used in a plastic bag with a towel around it to prevent frostbite to skin. This may be used about every 2 hours for 20 to 30 minutes, as needed while awake, or as directed by your caregiver.  Only take over-the-counter or prescription  medicines for pain, discomfort, or fever as directed by your caregiver.  If physical therapy was prescribed, follow your caregiver's directions.  Wear mouth appliances as directed if they were given. Document Released: 02/12/2001 Document Revised: 08/12/2011 Document Reviewed: 05/22/2008 [facility] Patient Information 2013 [place], [place].

## 2012-07-13 LAB — LIPID PANEL: HDL: 61 mg/dL (ref >39)

## 2012-07-24 ENCOUNTER — Telehealth: Payer: Self-pay | Admitting: Sports Medicine

## 2012-07-24 NOTE — Telephone Encounter (Signed)
[facility] 24 Hour After Hours Emergency Line: Weekend  Pt called, reports to be pt of Dr. Mikel Cella but no notes.  Multiple after hours calls over past couple of months noted. Delivered baby in September.  Reporting that her chronic reflux symptoms have worsened over the past 2 days.  She also reports having palpitations again and is concerned her reflux is cardiac in nature.  She reports that she was evaluated by Cardiology during pregnancy and was cleared from their standpoint.  Long standing history of palpitations & GERD on further chart review.  She denies any dyspnea, radiating pain, diaphoresis, orthostasis/dizziness, cough, congestion  Discussed that her symptoms seem consistent with her past history and given prior workup and lack of significant risk factors including age, BP (well controlled), no DM, no HLD, no family hx, the likelihood of this being an MI is low however given her symptom of epigastric discomfort, I cannot be sure this is not cardiac in origin and offered for her to be evaluated in the ED.  She also had questions if it was okay to Nexium with breast feeding and I discussed no safety data has been established but that PPIs have been used in children for reflux and are otherwise safe medicines.  Reviewed red flags that would absolutely warrant workup including dyspnea, orthopnea, dizziness, orthostasis.  Encouraged to follow up with Dr. Mikel Cella either way.  Pt voices understanding and chooses to defer further workup at this time and is appreciative.

## 2012-09-07 ENCOUNTER — Emergency Department (HOSPITAL_BASED_OUTPATIENT_CLINIC_OR_DEPARTMENT_OTHER)
Admission: EM | Admit: 2012-09-07 | Discharge: 2012-09-07 | Disposition: A | Discharge status: Home / Self Care | Payer: Managed Care, Other (non HMO) | Attending: Emergency Medicine | Admitting: Emergency Medicine

## 2012-09-07 ENCOUNTER — Encounter: Payer: Self-pay | Admitting: Family Medicine

## 2012-09-07 ENCOUNTER — Ambulatory Visit (INDEPENDENT_AMBULATORY_CARE_PROVIDER_SITE_OTHER): Payer: Managed Care, Other (non HMO) | Admitting: Family Medicine

## 2012-09-07 ENCOUNTER — Encounter (HOSPITAL_BASED_OUTPATIENT_CLINIC_OR_DEPARTMENT_OTHER): Payer: Self-pay | Admitting: *Deleted

## 2012-09-07 ENCOUNTER — Emergency Department (HOSPITAL_BASED_OUTPATIENT_CLINIC_OR_DEPARTMENT_OTHER): Payer: Managed Care, Other (non HMO)

## 2012-09-07 VITALS — BP 130/83 | HR 90 | Ht 62.0 in | Wt 154.6 lb

## 2012-09-07 DIAGNOSIS — Z79899 Other long term (current) drug therapy: Secondary | ICD-10-CM | POA: Insufficient documentation

## 2012-09-07 DIAGNOSIS — G8929 Other chronic pain: Secondary | ICD-10-CM

## 2012-09-07 DIAGNOSIS — E669 Obesity, unspecified: Secondary | ICD-10-CM | POA: Insufficient documentation

## 2012-09-07 DIAGNOSIS — H9209 Otalgia, unspecified ear: Secondary | ICD-10-CM | POA: Insufficient documentation

## 2012-09-07 DIAGNOSIS — I10 Essential (primary) hypertension: Secondary | ICD-10-CM | POA: Insufficient documentation

## 2012-09-07 DIAGNOSIS — J029 Acute pharyngitis, unspecified: Secondary | ICD-10-CM | POA: Insufficient documentation

## 2012-09-07 DIAGNOSIS — R6884 Jaw pain: Secondary | ICD-10-CM

## 2012-09-07 DIAGNOSIS — Z8619 Personal history of other infectious and parasitic diseases: Secondary | ICD-10-CM | POA: Insufficient documentation

## 2012-09-07 DIAGNOSIS — Z8679 Personal history of other diseases of the circulatory system: Secondary | ICD-10-CM | POA: Insufficient documentation

## 2012-09-07 DIAGNOSIS — Z8742 Personal history of other diseases of the female genital tract: Secondary | ICD-10-CM | POA: Insufficient documentation

## 2012-09-07 DIAGNOSIS — M542 Cervicalgia: Secondary | ICD-10-CM | POA: Insufficient documentation

## 2012-09-07 DIAGNOSIS — K219 Gastro-esophageal reflux disease without esophagitis: Secondary | ICD-10-CM | POA: Insufficient documentation

## 2012-09-07 DIAGNOSIS — R011 Cardiac murmur, unspecified: Secondary | ICD-10-CM | POA: Insufficient documentation

## 2012-09-07 DIAGNOSIS — R1310 Dysphagia, unspecified: Secondary | ICD-10-CM | POA: Insufficient documentation

## 2012-09-07 DIAGNOSIS — Z8719 Personal history of other diseases of the digestive system: Secondary | ICD-10-CM | POA: Insufficient documentation

## 2012-09-07 NOTE — ED Notes (Signed)
Patient transported to & from xray. 

## 2012-09-07 NOTE — Progress Notes (Signed)
  Subjective:    Patient ID: Jenna Mcclure, female    DOB: 29-Aug-1980, 32 y.o.   MRN: [id number]  HPIWork in appt  6 months of facial pressure, headaches worse in winter  Throat feels swollen but does not hurt, ear aching.  Worse when opening mouth.  Tried to treat acid reflux with Carafate which helped.  Went to urgent care in [place] and had normal xray and has tried nexium without improvement- still having throat swelling.  Talked to dentist who felt it was TMJ, tried ice packs  Has taken advil with some relief.  Pseudoephedrine helped some.    Has also tried nasonex and zyrtec without any improvement in symptoms.  No difference with meals.   I have reviewed patient's  PMH, FH, and Social history and Medications as related to this visit.  Review of Systems    No dyspnea Objective:   Physical Exam GEN: Alert & Oriented, No acute distress HEENT: [place]/AT. EOMI, PERRLA, no conjunctival injection or scleral icterus.  Bilateral tympanic membranes intact without erythema or effusion.  .  Nares without edema or rhinorrhea.  Oropharynx is without erythema or exudates.  No anterior or posterior cervical lymphadenopathy. CV:  Regular Rate & Rhythm, no murmur Respiratory:  Normal work of breathing, CTAB Abd:  + BS, soft, no tenderness to palpation         Assessment & Plan:

## 2012-09-07 NOTE — ED Notes (Signed)
Sore throat for several days states feels more like swelling and has soreness on right side of neck

## 2012-09-07 NOTE — Patient Instructions (Addendum)
Will refer you to ENT for further evaluation

## 2012-09-07 NOTE — Assessment & Plan Note (Signed)
Jaw pain with associated sinus symptoms and ear pain.  Has been seen by multiple doctors and her dentist and reports normal xray and failed treatments of PPI, carafate, decongestant, antihistamine, nasal steroid.  Dentists did not recommend bite guard, patient reports bruxism.   Will refer to ENT for further evaluation.

## 2012-09-07 NOTE — ED Provider Notes (Signed)
History     CSN: [id number]  Arrival date & time 09/07/12  [id number]   First MD Initiated Contact with Patient 09/07/12 [phone]      Chief Complaint  Patient presents with  . Sore Throat    (Consider location/radiation/quality/duration/timing/severity/associated sxs/prior treatment) Patient is a 32 y.o. female presenting with pharyngitis. The history is provided by the patient.  Sore Throat This is a new problem. Pertinent negatives include no chest pain, no abdominal pain and no headaches.   patient states that she's had sore throat for a few months. She states it started in her right ear and tends to work its way down. She states that she thinks he's been having problems with TMJ. She states it feels as if there is fullness in her lower throat. She may have a little difficulty breathing with it. No fevers. No coughing. No nasal drainage. There is not necessarily pain with swallowing but states it feels slightly difficult. No weight gain weight loss. No masses. No difficulty hearing. Patient states she took Nexium with no relief. Patient states she thinks she has reflux also.  Past Medical History  Diagnosis Date  . Hypertension   . Bilateral ovarian cysts   . OBESITY 12/01/2008    Qualifier: Diagnosis of  By: Sandi Mealy  MD, Judeth Cornfield    . Palpitations 02/28/2010    Qualifier: Diagnosis of  Problem Stop Reason:  By: Lelon Perla MD, Vickki Muff    . RHINITIS, ALLERGIC 07/31/2006    Qualifier: Diagnosis of  By: Knox Royalty    . Abnormal Pap smear     normal for the last 6 yrs  . Hx of gonorrhea   . GERD (gastroesophageal reflux disease)   . Heart murmur     staes has heart murmur but - echo and - EKG    Past Surgical History  Procedure Laterality Date  . Laparoscopy      Family History  Problem Relation Age of Onset  . Hypertension Mother   . Hypertension Maternal Aunt   . Diabetes Maternal Uncle   . Diabetes Maternal Grandmother   . Diabetes Paternal Grandmother     History  Substance Use  Topics  . Smoking status: Never Smoker   . Smokeless tobacco: Never Used  . Alcohol Use: No    OB History   Grav Para Term Preterm Abortions TAB SAB Ect Mult Living   5 5 5  0 0 0 0 0 0 5      Review of Systems  Constitutional: Negative for fever, diaphoresis, appetite change and fatigue.  HENT: Positive for ear pain, trouble swallowing and neck pain. Negative for hearing loss, facial swelling, rhinorrhea, mouth sores, voice change, postnasal drip and ear discharge.   Eyes: Negative for pain, redness and itching.  Cardiovascular: Negative for chest pain.  Gastrointestinal: Negative for abdominal pain.  Musculoskeletal: Negative for back pain.  Skin: Negative for pallor.  Neurological: Negative for headaches.  Psychiatric/Behavioral: Negative for confusion.    Allergies  Miconazole nitrate  Home Medications   Current Outpatient Rx  Name  Route  Sig  Dispense  Refill  . calcium carbonate (TUMS - DOSED IN MG ELEMENTAL CALCIUM) 500 MG chewable tablet   Oral   Chew 1 tablet by mouth daily as needed. For heartburn         . cetirizine (ZYRTEC) 10 MG tablet   Oral   Take 1 tablet (10 mg total) by mouth daily.   30 tablet   3   .  ibuprofen (ADVIL,MOTRIN) 600 MG tablet   Oral   Take 1 tablet (600 mg total) by mouth every 6 (six) hours.   30 tablet   0   . ibuprofen (ADVIL,MOTRIN) 800 MG tablet   Oral   Take 1 tablet (800 mg total) by mouth every 8 (eight) hours as needed for pain.   30 tablet   0   . mometasone (NASONEX) 50 MCG/ACT nasal spray   Nasal   Place 2 sprays into the nose daily.   17 g   3   . norethindrone (ORTHO MICRONOR) 0.35 MG tablet   Oral   Take 1 tablet (0.35 mg total) by mouth daily.   1 Package   11   . oxyCODONE-acetaminophen (PERCOCET/ROXICET) 5-325 MG per tablet   Oral   Take 1-2 tablets by mouth every 3 (three) hours as needed (moderate - severe pain).   30 tablet   0   . Prenatal Vit-Fe Fumarate-FA (PRENATAL MULTIVITAMIN) TABS    Oral   Take 1 tablet by mouth daily.         . ranitidine (ZANTAC) 150 MG tablet   Oral   Take 1 tablet (150 mg total) by mouth 2 (two) times daily.   60 tablet   0   . triamcinolone cream (KENALOG) 0.1 %   Topical   Apply topically 2 (two) times daily.   30 g   0     BP 126/79  Pulse 107  Temp(Src) 98.4 F (36.9 C) (Oral)  Resp 18  Ht 5\' 2"  (1.575 m)  Wt 156 lb (70.761 kg)  BMI 28.53 kg/m2  SpO2 100%  LMP 08/10/2012  Breastfeeding? No  Physical Exam  Nursing note and vitals reviewed. Constitutional: She is oriented to person, place, and time. She appears well-developed and well-nourished.  HENT:  Head: Normocephalic and atraumatic.  Mouth/Throat: No oropharyngeal exudate.  Small effusion behind right TM. Slightly more effusion behind left TM. No erythema. Notable tree.  Eyes: EOM are normal. Pupils are equal, round, and reactive to light.  Neck: Normal range of motion. Neck supple. No thyromegaly present.  Cardiovascular: Normal rate, regular rhythm and normal heart sounds.   No murmur heard. Pulmonary/Chest: Effort normal and breath sounds normal. No respiratory distress. She has no wheezes. She has no rales.  Abdominal: Soft. Bowel sounds are normal. She exhibits no distension. There is no tenderness. There is no rebound and no guarding.  Musculoskeletal: Normal range of motion.  Neurological: She is alert and oriented to person, place, and time. No cranial nerve deficit.  Skin: Skin is warm and dry.  Psychiatric: She has a normal mood and affect. Her speech is normal.    ED Course  Procedures (including critical care time)  Labs Reviewed - No data to display Dg Neck Soft Tissue  09/07/2012  *RADIOLOGY REPORT*  Clinical Data: Sore throat for several days.  NECK SOFT TISSUES - 1+ VIEW  Comparison: None.  Findings: Single lateral soft tissue view of the neck.  The prevertebral soft tissues are unremarkable.  Airway is widely patent.  The epiglottis is normal.   IMPRESSION: Normal soft tissue evaluation of the neck.   Original Report Authenticated By: Jeronimo Greaves, M.D.      1. Sore throat       MDM  Patient with sore throat and pain in her right jaw area down to her throat. States it feels full. States that with her job of talking a lot his been difficult. Exam  is benign. No erythema. Doubt strep. Soft tissue x-ray showed normal valuation. Patient will follow with her PCP        Juliet Rude. Rubin Payor, MD 09/07/12 [phone]

## 2012-09-29 ENCOUNTER — Ambulatory Visit: Payer: Managed Care, Other (non HMO) | Admitting: Family Medicine

## 2012-10-08 ENCOUNTER — Other Ambulatory Visit: Payer: Self-pay | Admitting: Family Medicine

## 2012-10-08 ENCOUNTER — Ambulatory Visit (INDEPENDENT_AMBULATORY_CARE_PROVIDER_SITE_OTHER): Payer: Managed Care, Other (non HMO) | Admitting: Family Medicine

## 2012-10-08 ENCOUNTER — Encounter: Payer: Self-pay | Admitting: Family Medicine

## 2012-10-08 VITALS — BP 118/72 | HR 75 | Temp 98.7°F | Ht 62.0 in | Wt 153.0 lb

## 2012-10-08 DIAGNOSIS — N644 Mastodynia: Secondary | ICD-10-CM

## 2012-10-08 DIAGNOSIS — R1032 Left lower quadrant pain: Secondary | ICD-10-CM

## 2012-10-08 DIAGNOSIS — K219 Gastro-esophageal reflux disease without esophagitis: Secondary | ICD-10-CM

## 2012-10-08 MED ORDER — ESOMEPRAZOLE MAGNESIUM 40 MG PO CPDR: 40.0000 mg | DELAYED_RELEASE_CAPSULE | Freq: Every day | ORAL | Status: DC

## 2012-10-08 NOTE — Patient Instructions (Addendum)
It was good to see you today!  We will do a mammogram for your pain and I will call you with those results. Also, I have sent in a prescription for Nexium. If it does not work, let me know of Jordan's next appointment and we can try carafate again.  Take care! Lequisha Cammack M. Dorethy Tomey, M.D.

## 2012-10-08 NOTE — Progress Notes (Signed)
Patient ID: Jenna Mcclure, female   DOB: 28-Apr-1981, 32 y.o.   MRN: [id number]  [facility] Clinton Wahlberg M. Abdelaziz Westenberger, MD Phone: [phone]   Subjective: HPI: Patient is a 32 y.o. female presenting to clinic today for follow up appointment. Concerns today include acid reflux.   1. Acid Reflux- Had had GERD in the past and was on Carafate which helped a lot. Now taking a family member's Nexium which has helped. She reports the sensation that something is stuck in her throat and she gets a burning sensation at night. Not associated with any foods that she can recognize.   2. Left breast pain- no breastfeeding in last 1.5 months. Pain with lifting arm or turning. No breast redness, no swelling. Still making some milk especially in the left breast. No blood or other nipple discharge. No pain on right side. Has had pain in this breast before. Had ultrasound in the past which was normal. Never had mammogram. She had a distant aunt with breast cancer but no immediate family memebers  3. Abdominal pain- Ovarian cyst on right in feb. Stomach stays bloated for last few months. Some pain with intercourse, especially on right side.   History Reviewed: Non-smoker. Health Maintenance: UTD  ROS: Please see HPI above.  Objective: Office vital signs reviewed. BP 118/72  Pulse 75  Temp(Src) 98.7 F (37.1 C) (Oral)  Ht 5\' 2"  (1.575 m)  Wt 153 lb (69.4 kg)  BMI 27.98 kg/m2  LMP 09/10/2012  Physical Examination:  General: Awake, alert. NAD. Very pleasant HEENT: Atraumatic, normocephalic. MMM Neck: No masses palpated. No LAD Pulm: CTAB, no wheezes Cardio: RRR, no murmurs appreciated Breast: Breasts symmetric bilaterally without obvious swelling or redness. Right breast non-tender but with some dense tissue. Left breast also dense, with TTP of the 12:00 position. Some increased density here in a fibrous pattern but less TTP when just palpating breast tissue rather than chest wall. No  nipple discharge. Abdomen:+BS, soft, nondistended. Mild TTP LLQ without rebound or guarding. No other tenderness. Extremities: No edema Neuro: Grossly intact  Assessment: 32 y.o. female with GERD, breast pain and abdominal pain  Plan: See Problem List and After Visit Summary

## 2012-10-09 DIAGNOSIS — N644 Mastodynia: Secondary | ICD-10-CM | POA: Insufficient documentation

## 2012-10-09 DIAGNOSIS — R1032 Left lower quadrant pain: Secondary | ICD-10-CM | POA: Insufficient documentation

## 2012-10-09 NOTE — Assessment & Plan Note (Signed)
Told by OB she had a cyst. Mild TTP on exam but rather unremarkable. If continues can consider further imaging or return to OB for eval.

## 2012-10-09 NOTE — Assessment & Plan Note (Signed)
Known history of GERD with improvement on Nexium. This Rx was sent to pharmacy. Since carafate has worked in the past, can also prescribe this if needed but will start will PPI only.

## 2012-10-09 NOTE — Assessment & Plan Note (Signed)
Since this has been going on before and after pregnancy and she has a fibrous feeling density in area will order diagnostic mammo and ultrasound for reassurance. No medications prescribed. Continue to monitor.

## 2012-10-12 ENCOUNTER — Telehealth: Payer: Self-pay | Admitting: *Deleted

## 2012-10-12 NOTE — Telephone Encounter (Signed)
PA form faxed to [facility], RN-BSN

## 2012-10-13 ENCOUNTER — Telehealth: Payer: Self-pay | Admitting: *Deleted

## 2012-10-13 NOTE — Telephone Encounter (Signed)
PA received and approved from 10/12/12-10/13/15. Pharmacy called to inform. Wyatt Haste, RN-BSN

## 2012-10-14 ENCOUNTER — Telehealth: Payer: Self-pay | Admitting: Family Medicine

## 2012-10-14 NOTE — Telephone Encounter (Signed)
Is asking to speak with Dr Mikel Cella.  She says it is very important - it's about the conversation that they had last week.

## 2012-10-14 NOTE — Telephone Encounter (Signed)
After speaking to patients mother,she stated to disregard message,her question was regarding her child's cream,she states it was answered by the pharmacist.she was very appreciated that we addressed this message and to Thank  Dr Mikel Cella for her. Jenna Mcclure, Jenna Mcclure

## 2012-10-15 ENCOUNTER — Telehealth: Payer: Self-pay | Admitting: Family Medicine

## 2012-10-15 NOTE — Telephone Encounter (Signed)
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

## 2012-10-16 ENCOUNTER — Encounter: Payer: Self-pay | Admitting: Family Medicine

## 2012-10-16 NOTE — Telephone Encounter (Signed)
Patient is calling asking for one of the attending to look at her records from 2010/2011 and write something simple about what her medical condition was like during that time for her school so that she can be reinstated.  Since neither of the doctors that she saw at that time are here any longer, this will likely need to be done by an Attending.  It looks like Dr. Leveda Anna may have seen her during this time, so I am going to direct this to him.

## 2012-10-16 NOTE — Telephone Encounter (Signed)
Called and discussed how much detail was needed.  Letter written.

## 2012-10-20 ENCOUNTER — Ambulatory Visit
Admission: RE | Admit: 2012-10-20 | Discharge: 2012-10-20 | Disposition: A | Discharge status: Home / Self Care | Payer: Managed Care, Other (non HMO) | Source: Ambulatory Visit | Attending: Family Medicine | Admitting: Family Medicine

## 2012-10-20 DIAGNOSIS — N644 Mastodynia: Secondary | ICD-10-CM

## 2012-11-26 ENCOUNTER — Encounter: Payer: Self-pay | Admitting: Family Medicine

## 2012-11-26 ENCOUNTER — Ambulatory Visit (INDEPENDENT_AMBULATORY_CARE_PROVIDER_SITE_OTHER): Payer: Managed Care, Other (non HMO) | Admitting: Family Medicine

## 2012-11-26 VITALS — BP 116/73 | HR 87 | Temp 98.1°F | Ht 62.0 in | Wt 149.0 lb

## 2012-11-26 DIAGNOSIS — N760 Acute vaginitis: Secondary | ICD-10-CM

## 2012-11-26 DIAGNOSIS — Z658 Other specified problems related to psychosocial circumstances: Secondary | ICD-10-CM

## 2012-11-26 DIAGNOSIS — Z659 Problem related to unspecified psychosocial circumstances: Secondary | ICD-10-CM

## 2012-11-26 MED ORDER — METRONIDAZOLE 500 MG PO TABS: 500.0000 mg | ORAL_TABLET | Freq: Two times a day (BID) | ORAL | Status: DC

## 2012-11-26 MED ORDER — CLONAZEPAM 0.5 MG PO TABS: 0.5000 mg | ORAL_TABLET | Freq: Two times a day (BID) | ORAL | Status: DC | PRN

## 2012-11-26 NOTE — Progress Notes (Signed)
Patient ID: AIBHLINN KALMAR, female   DOB: [birth year], 32 y.o.   MRN: [id number]  [facility] Nakesha Ebrahim M. Tyjanae Bartek, MD Phone: [phone]   Subjective: HPI: Patient is a 32 y.o. female presenting to clinic today for concern for BV and depression.  Depression Patient complains of depression and anxiety. She complains of hopelessness and crying. Onset was approximately 1 year ago but acutely worse due to social stressors including her husband. Symptoms have been intermittent since that time. Current symptoms include: uncontrollable crying. Patient denies SI/HI. She has never been treated and has no history of substance use. Patient states she needs time off work to deal with everything going on. Last date of work was 11/24/12. She has asked for short term leave paperwork to be sent to me. She also has arranged to see a counselor at her job on Friday to discuss everything going on.  Vaginitis Patient presents for evaluation of an abnormal vaginal discharge. Symptoms have been present for 2 weeks. Vaginal symptoms: odor. Contraception: OCP (estrogen/progesterone). She denies abnormal bleeding and pain. LMP June 11. Has has BV in the past and states this is the same. She has not had intercourse since her LMP.   History Reviewed: Non-smoker.  ROS: Please see HPI above.  Objective: Office vital signs reviewed. BP 116/73  Pulse 87  Temp(Src) 98.1 F (36.7 C) (Oral)  Ht 5\' 2"  (1.575 m)  Wt 149 lb (67.586 kg)  BMI 27.25 kg/m2  Physical Examination:  General: Awake, alert. Crying and visibly upset HEENT: Atraumatic, normocephalic. MMM Neuro: Grossly intact Psych: Answers questions appropriately. Makes good eye contact. Able to focus on positive things such as her 5 children/  Assessment: 32 y.o. female with acute social stressor and vaginitis  Plan: See Problem List and After Visit Summary

## 2012-11-26 NOTE — Patient Instructions (Signed)
It was good to see you today. Take the medicine up to 2 times per day as needed for the next 2 weeks. I will see you on the 11th, or if you need anything sooner, do not hesitate to call. If I am not available, please ask to see Dr. Harrel Carina M. Leathia Farnell, M.D.

## 2012-11-26 NOTE — Assessment & Plan Note (Signed)
Discussed with Dr. Pascal Lux. In acute stress, will not start SSRI since we cannot get full history of depression. PHQ of 22 today. Will start Klonopin 0.5mg  BID as a short term medication to help with acute anxiety and help her function then RTC in 2 weeks for re-evaluation and to discuss starting long term medication such as SSRI. Patient will request LOA paperwork be sent to me which I will complete for her. Plans to see a counselor at work and if needs additional resources, I am available to help with that as well.

## 2012-11-26 NOTE — Assessment & Plan Note (Signed)
Most likely BV based on timing of period and history of BV. Will treat with Flagyl PO. If not improved, will do wet prep.

## 2012-11-30 ENCOUNTER — Telehealth: Payer: Self-pay | Admitting: Family Medicine

## 2012-11-30 NOTE — Telephone Encounter (Signed)
Unable to locate 2nd page of [employer] forms sent on 11/27/12, we think that this may have the return fax number.  LMOVM for pt to return call.  Will give forms to Gaylyn Lambert as I will not be here tomorrow. Ysidro Ramsay, Maryjo Rochester

## 2012-11-30 NOTE — Telephone Encounter (Signed)
Patient is wanting to know if her FLMA paperwork was received? If not call please call her.JW

## 2012-11-30 NOTE — Telephone Encounter (Signed)
Per Dr Mikel Cella 2nd set of FMLA papers faxed today, pt notified.

## 2012-12-02 NOTE — Telephone Encounter (Signed)
Pt called back.  According to [employer], they have received all pages of both sets a forms BUT he is unable to justify pt being out of work for a couple more weeks.  He advises that the guidelines deem that she should come back on Monday.  Pt does not feel as if this is in her best interest, and that [name] said if the MD called him and gave him a little more medical detail maybe he could justify it.   Cary's number is [phone].  Pt ask that she be updated so she knows wether or not to go back to work on Monday. Jenna Mcclure, Jenna Mcclure

## 2012-12-03 NOTE — Telephone Encounter (Signed)
Sorry about all that confusion :-(

## 2012-12-03 NOTE — Telephone Encounter (Signed)
After attempting to navigate automatic prompts for >10 minutes and being on hold for even longer, I was finally able to reach someone in the [place] department for disability. (For future reference, [employer]'s employee number is [phone], and we should choose prompt #3 followed by #2 according to the person on the phone.)  I spoke with a person in the disability department who finally put me in touch with [facility] [employer]. He states that the clinical information must match the documentation, which I agree with and did my best to document. This office visit was NOT for mental health, and I did not document it that way. It was for anxiety which was treated, and recommended close follow up. Patient has sought out a mental health provider who will complete further paperwork.  I appreciate [name] [employer]'s assistance with this and from my perspective, I am done with this claim.  Glendale Youngblood M. Addisen Chappelle, M.D. 12/03/2012 12:18 PM

## 2012-12-11 ENCOUNTER — Ambulatory Visit: Payer: Managed Care, Other (non HMO) | Admitting: Family Medicine

## 2013-01-10 ENCOUNTER — Ambulatory Visit (INDEPENDENT_AMBULATORY_CARE_PROVIDER_SITE_OTHER): Payer: Managed Care, Other (non HMO) | Admitting: Emergency Medicine

## 2013-01-10 VITALS — BP 108/78 | HR 97 | Temp 99.0°F | Resp 17 | Ht 63.5 in | Wt 154.0 lb

## 2013-01-10 DIAGNOSIS — J029 Acute pharyngitis, unspecified: Secondary | ICD-10-CM

## 2013-01-10 DIAGNOSIS — N760 Acute vaginitis: Secondary | ICD-10-CM

## 2013-01-10 DIAGNOSIS — R5381 Other malaise: Secondary | ICD-10-CM

## 2013-01-10 DIAGNOSIS — N898 Other specified noninflammatory disorders of vagina: Secondary | ICD-10-CM

## 2013-01-10 DIAGNOSIS — L293 Anogenital pruritus, unspecified: Secondary | ICD-10-CM

## 2013-01-10 LAB — POCT CBC
Granulocyte percent: 67.1 %G (ref 37–80)
HCT, POC: 41.7 % (ref 37.7–47.9)
Hemoglobin: 13.2 g/dL (ref 12.2–16.2)
MPV: 10.1 fL (ref 0–99.8)
POC Granulocyte: 5 (ref 2–6.9)
POC MID %: 4.4 %M (ref 0–12)
RBC: 4.58 M/uL (ref 4.04–5.48)

## 2013-01-10 LAB — POCT WET PREP WITH KOH
Trichomonas, UA: NEGATIVE
Yeast Wet Prep HPF POC: NEGATIVE

## 2013-01-10 LAB — POCT URINALYSIS DIPSTICK
Bilirubin, UA: NEGATIVE
Blood, UA: NEGATIVE
Glucose, UA: NEGATIVE
Nitrite, UA: NEGATIVE
Spec Grav, UA: 1.025
Urobilinogen, UA: 0.2
pH, UA: 6.5

## 2013-01-10 LAB — T4, FREE: Free T4: 1.1 ng/dL (ref 0.80–1.80)

## 2013-01-10 LAB — TSH: TSH: 1.391 u[IU]/mL (ref 0.350–4.500)

## 2013-01-10 LAB — POCT RAPID STREP A (OFFICE): Rapid Strep A Screen: NEGATIVE

## 2013-01-10 LAB — GC/CHLAMYDIA PROBE AMP: GC Probe RNA: NEGATIVE

## 2013-01-10 LAB — POCT UA - MICROSCOPIC ONLY
Casts, Ur, LPF, POC: NEGATIVE
Yeast, UA: POSITIVE

## 2013-01-10 MED ORDER — CLINDAMYCIN PHOSPHATE 100 MG VA SUPP: 100.0000 mg | Freq: Every day | VAGINAL | Status: DC

## 2013-01-10 MED ORDER — FLUCONAZOLE 150 MG PO TABS: 150.0000 mg | ORAL_TABLET | Freq: Once | ORAL | Status: DC

## 2013-01-10 NOTE — Patient Instructions (Addendum)
Bacterial Vaginosis Bacterial vaginosis (BV) is a vaginal infection where the normal balance of bacteria in the vagina is disrupted. The normal balance is then replaced by an overgrowth of certain bacteria. There are several different kinds of bacteria that can cause BV. BV is the most common vaginal infection in women of childbearing age. CAUSES   The cause of BV is not fully understood. BV develops when there is an increase or imbalance of harmful bacteria.  Some activities or behaviors can upset the normal balance of bacteria in the vagina and put women at increased risk including:  Having a new sex partner or multiple sex partners.  Douching.  Using an intrauterine device (IUD) for contraception.  It is not clear what role sexual activity plays in the development of BV. However, women that have never had sexual intercourse are rarely infected with BV. Women do not get BV from toilet seats, bedding, swimming pools or from touching objects around them.  SYMPTOMS   Grey vaginal discharge.  A fish-like odor with discharge, especially after sexual intercourse.  Itching or burning of the vagina and vulva.  Burning or pain with urination.  Some women have no signs or symptoms at all. DIAGNOSIS  Your caregiver must examine the vagina for signs of BV. Your caregiver will perform lab tests and look at the sample of vaginal fluid through a microscope. They will look for bacteria and abnormal cells (clue cells), a pH test higher than 4.5, and a positive amine test all associated with BV.  RISKS AND COMPLICATIONS   Pelvic inflammatory disease (PID).  Infections following gynecology surgery.  Developing HIV.  Developing herpes virus. TREATMENT  Sometimes BV will clear up without treatment. However, all women with symptoms of BV should be treated to avoid complications, especially if gynecology surgery is planned. Female partners generally do not need to be treated. However, BV may spread  between female sex partners so treatment is helpful in preventing a recurrence of BV.   BV may be treated with antibiotics. The antibiotics come in either pill or vaginal cream forms. Either can be used with nonpregnant or pregnant women, but the recommended dosages differ. These antibiotics are not harmful to the baby.  BV can recur after treatment. If this happens, a second round of antibiotics will often be prescribed.  Treatment is important for pregnant women. If not treated, BV can cause a premature delivery, especially for a pregnant woman who had a premature birth in the past. All pregnant women who have symptoms of BV should be checked and treated.  For chronic reoccurrence of BV, treatment with a type of prescribed gel vaginally twice a week is helpful. HOME CARE INSTRUCTIONS   Finish all medication as directed by your caregiver.  Do not have sex until treatment is completed.  Tell your sexual partner that you have a vaginal infection. They should see their caregiver and be treated if they have problems, such as a mild rash or itching.  Practice safe sex. Use condoms. Only have 1 sex partner. PREVENTION  Basic prevention steps can help reduce the risk of upsetting the natural balance of bacteria in the vagina and developing BV:  Do not have sexual intercourse (be abstinent).  Do not douche.  Use all of the medicine prescribed for treatment of BV, even if the signs and symptoms go away.  Tell your sex partner if you have BV. That way, they can be treated, if needed, to prevent reoccurrence. SEEK MEDICAL CARE IF:     Your symptoms are not improving after 3 days of treatment.  You have increased discharge, pain, or fever. MAKE SURE YOU:   Understand these instructions.  Will watch your condition.  Will get help right away if you are not doing well or get worse. FOR MORE INFORMATION  Division of STD Prevention (DSTDP), Centers for Disease Control and Prevention:  SolutionApps.co.za American Social Health Association (ASHA): www.ashastd.org  Document Released: 05/20/2005 Document Revised: 08/12/2011 Document Reviewed: 11/10/2008 [facility], [facility], [facility] Patient Information 2014 [place], [place]. Monilial Vaginitis Vaginitis in a soreness, swelling and redness (inflammation) of the vagina and vulva. Monilial vaginitis is not a sexually transmitted infection. CAUSES  Yeast vaginitis is caused by yeast (candida) that is normally found in your vagina. With a yeast infection, the candida has overgrown in number to a point that upsets the chemical balance. SYMPTOMS  White, thick vaginal discharge. Swelling, itching, redness and irritation of the vagina and possibly the lips of the vagina (vulva). Burning or painful urination. Painful intercourse. DIAGNOSIS  Things that may contribute to monilial vaginitis are: Postmenopausal and virginal states. Pregnancy. Infections. Being tired, sick or stressed, especially if you had monilial vaginitis in the past. Diabetes. Good control will help lower the chance. Birth control pills. Tight fitting garments. Using bubble bath, feminine sprays, douches or deodorant tampons. Taking certain medications that kill germs (antibiotics). Sporadic recurrence can occur if you become ill. TREATMENT  Your caregiver will give you medication. There are several kinds of anti monilial vaginal creams and suppositories specific for monilial vaginitis. For recurrent yeast infections, use a suppository or cream in the vagina 2 times a week, or as directed. Anti-monilial or steroid cream for the itching or irritation of the vulva may also be used. Get your caregiver's permission. Painting the vagina with methylene blue solution may help if the monilial cream does not work. Eating yogurt may help prevent monilial vaginitis. HOME CARE INSTRUCTIONS  Finish all medication as prescribed. Do not have sex until treatment is completed or after your caregiver  tells you it is okay. Take warm sitz baths. Do not douche. Do not use tampons, especially scented ones. Wear cotton underwear. Avoid tight pants and panty hose. Tell your sexual partner that you have a yeast infection. They should go to their caregiver if they have symptoms such as mild rash or itching. Your sexual partner should be treated as well if your infection is difficult to eliminate. Practice safer sex. Use condoms. Some vaginal medications cause latex condoms to fail. Vaginal medications that harm condoms are: Cleocin cream. Butoconazole (Femstat). Terconazole (Terazol) vaginal suppository. Miconazole (Monistat) (may be purchased over the counter). SEEK MEDICAL CARE IF:  You have a temperature by mouth above 102 F (38.9 C). The infection is getting worse after 2 days of treatment. The infection is not getting better after 3 days of treatment. You develop blisters in or around your vagina. You develop vaginal bleeding, and it is not your menstrual period. You have pain when you urinate. You develop intestinal problems. You have pain with sexual intercourse. Document Released: 02/27/2005 Document Revised: 08/12/2011 Document Reviewed: 11/11/2008 [facility] Patient Information 2014 [place], [place].

## 2013-01-10 NOTE — Progress Notes (Signed)
Subjective:    Patient ID: Jenna Mcclure, female    DOB: [birth year], 32 y.o.   MRN: [id number]  HPI  32 year old female presents with throat feeling swollen.  Was told in the past she had acid reflux.  The past three days, she feels like something is in her throat blocking her airway. She had to sleep on three pillows last night to feel like she could breathe No heartburn at this time.  She has taken a nexium over the past five days.  No vaginal discharge but she is itching.  Nor urinary symptoms.  Concerned this could be her thyroid.  She feels like someone is sufficating her and her voice gets hoarse.  No history of thyroid problems    Review of Systems     Objective:   Physical Exam patient is alert cooperative she does not appear in distress. Her neck is supple. The lower portion of the thyroid on the right is slightly tender but there is no definite nodule present. Chest was clear heart regular rate no murmurs abdomen soft nontender pelvic exam reveals a thick whitish yellow discharge in the vaginal vault. There small cervical erosions noted. Gen-Probe and wet prep were obtained. The uterus is nonenlarged uterus is nontender there are no adnexal masses.  Results for orders placed in visit on 01/10/13  POCT RAPID STREP A (OFFICE)      Result Value Range   Rapid Strep A Screen Negative  Negative  POCT CBC      Result Value Range   WBC 7.4  4.6 - 10.2 K/uL   Lymph, poc 2.1  0.6 - 3.4   POC LYMPH PERCENT 28.5  10 - 50 %L   MID (cbc) 0.3  0 - 0.9   POC MID % 4.4  0 - 12 %M   POC Granulocyte 5.0  2 - 6.9   Granulocyte percent 67.1  37 - 80 %G   RBC 4.58  4.04 - 5.48 M/uL   Hemoglobin 13.2  12.2 - 16.2 g/dL   HCT, POC 11.9  14.7 - 47.9 %   MCV 91.0  80 - 97 fL   MCH, POC 28.8  27 - 31.2 pg   MCHC 31.7 (*) 31.8 - 35.4 g/dL   RDW, POC 82.9     Platelet Count, POC 239  142 - 424 K/uL   MPV 10.1  0 - 99.8 fL  POCT WET PREP WITH KOH      Result Value Range   Trichomonas, UA Negative      Clue Cells Wet Prep HPF POC 3-16     Epithelial Wet Prep HPF POC 8-29     Yeast Wet Prep HPF POC neg     Bacteria Wet Prep HPF POC 4+     RBC Wet Prep HPF POC 1-12     WBC Wet Prep HPF POC tntc     KOH Prep POC Positive    POCT UA - MICROSCOPIC ONLY      Result Value Range   WBC, Ur, HPF, POC 2-16     RBC, urine, microscopic 0-3     Bacteria, U Microscopic 1+     Mucus, UA mod     Epithelial cells, urine per micros 3-12     Crystals, Ur, HPF, POC neg     Casts, Ur, LPF, POC neg     Yeast, UA pos    POCT URINALYSIS DIPSTICK      Result Value Range  Color, UA yellow     Clarity, UA cloudy     Glucose, UA neg     Bilirubin, UA neg     Ketones, UA neg     Spec Grav, UA 1.025     Blood, UA neg     pH, UA 6.5     Protein, UA neg     Urobilinogen, UA 0.2     Nitrite, UA neg     Leukocytes, UA small (1+)          Assessment & Plan:  We'll treat with Flagyl 500 twice a day. Thyroid tests were done further treatment depending on what this shows. She will take ibuprofen until her test results are back. Patient has a topical reaction to miconazole but she is taking oral Diflucan without any difficulty.

## 2013-01-11 ENCOUNTER — Telehealth: Payer: Self-pay

## 2013-01-11 LAB — THYROID ANTIBODIES
Thyroglobulin Ab: <20 U/mL (ref <40.0)
Thyroperoxidase Ab SerPl-aCnc: 14.2 IU/mL (ref <35.0)

## 2013-01-11 NOTE — Telephone Encounter (Signed)
Pt is needing to talk with someone for a different medication what we have prescribed is too expensive

## 2013-01-12 MED ORDER — METRONIDAZOLE 500 MG PO TABS: 500.0000 mg | ORAL_TABLET | Freq: Two times a day (BID) | ORAL | Status: DC

## 2013-01-12 NOTE — Telephone Encounter (Signed)
Called left message for her to call me back.  

## 2013-01-12 NOTE — Telephone Encounter (Signed)
Has pt taken metronidazole (Flagyl) before?  This will be the least expensive medication.  Because she has a listed allergy to miconazole which is in the same class, Dr. Cleta Alberts probably chose the clindamycin.  What is her reaction to miconazole?  If she can tolerate metronidazole, ok to send 500mg  BID x 7 days.  If she cannot tolerated metronidazole, clindamycin will be our only option

## 2013-01-12 NOTE — Telephone Encounter (Signed)
Which one is too expensive?

## 2013-01-12 NOTE — Telephone Encounter (Signed)
Called again. She had rash from the cream, please advise if okay to send in.

## 2013-01-12 NOTE — Telephone Encounter (Signed)
Called her. Left message for her to call me back.  

## 2013-01-12 NOTE — Telephone Encounter (Signed)
Spoke to her, the Clindamycin suppository is expensive, can we change?

## 2013-01-12 NOTE — Telephone Encounter (Signed)
Patient advised.

## 2013-01-12 NOTE — Telephone Encounter (Signed)
I have sent flagyl to the pharmacy.  Based on her med list, looks like she has been on this medication before.  She cannot consume alcohol with this medication

## 2013-02-21 IMAGING — US US EXTREM LOW VENOUS*R*
1 series · 14 of 24 positions shown · non-contrast
Comparison: None.

CLINICAL DATA: Leg pain

RIGHT LOWER EXTREMITY VENOUS DUPLEX ULTRASOUND
TECHNIQUE: Gray-scale sonography with graded compression, as well
as color Doppler and duplex ultrasound were performed to evaluate
the deep venous system of the lower extremity from the level of the
common femoral vein through the popliteal and proximal calf veins.
Spectral Doppler was utilized to evaluate flow at rest and with
distal augmentation maneuvers.

[Series 1: us extrem low venous*right* · 14 of 27 slices shown]
[im 1/27]
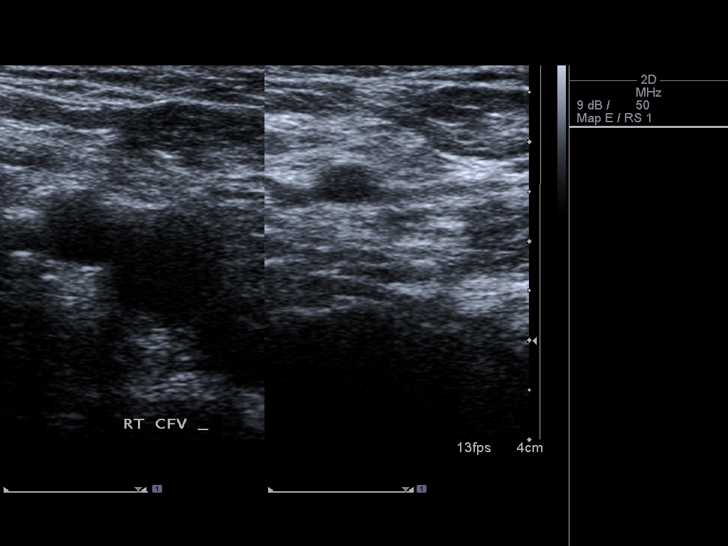
[im 3/27]
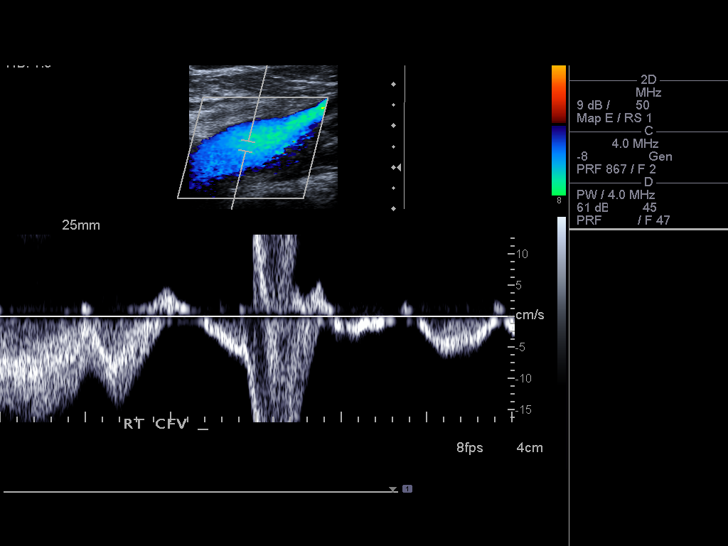
[im 5/27]
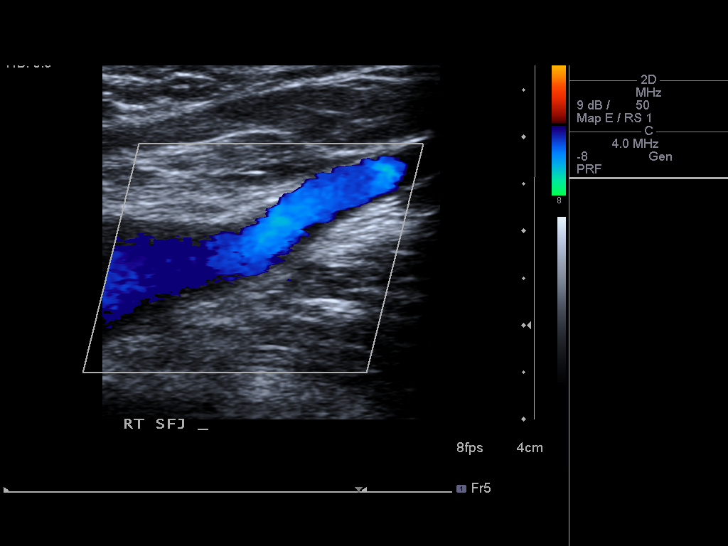
[im 7/27]
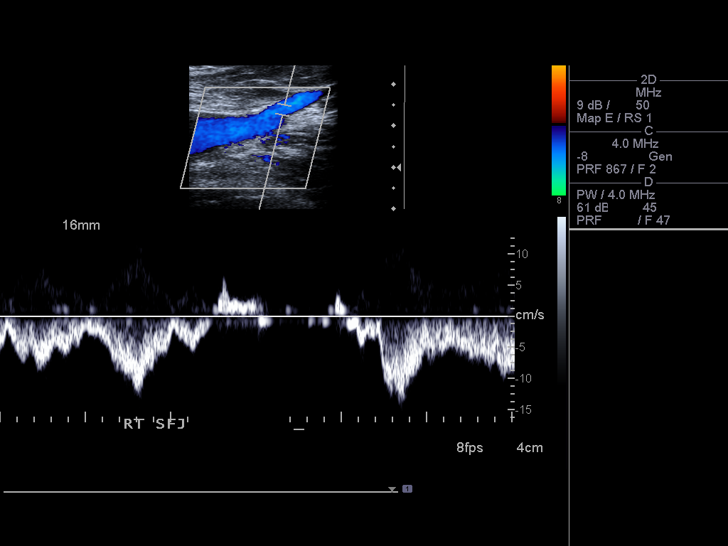
[im 8/27]
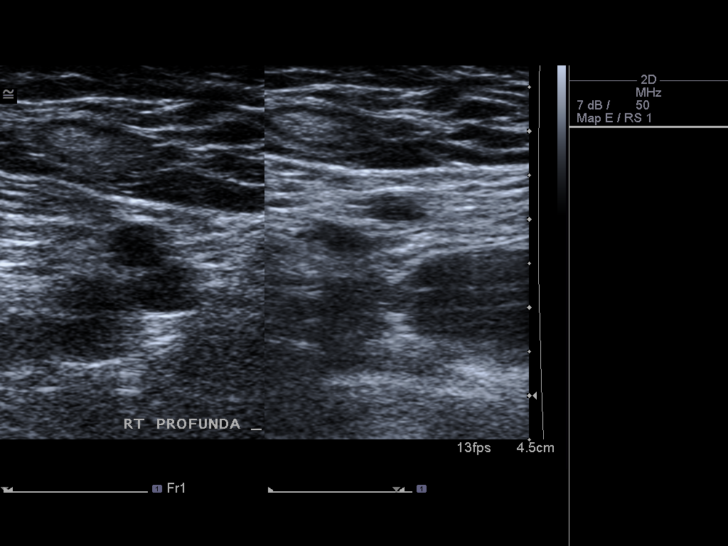
[im 11/27]
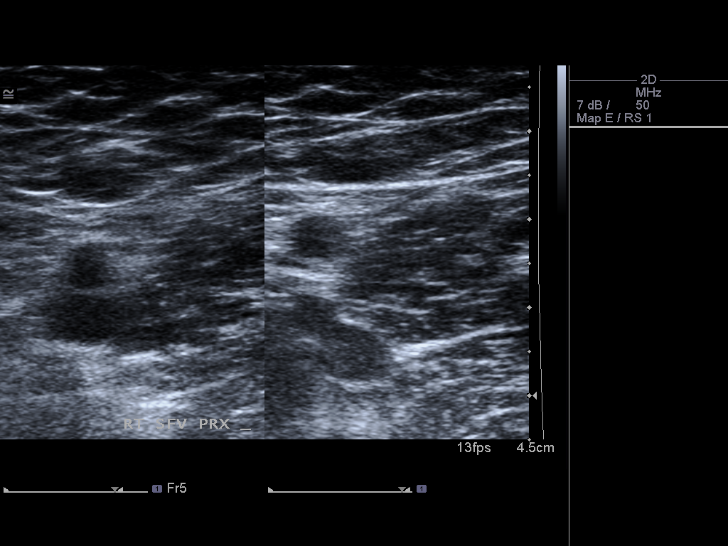
[im 13/27]
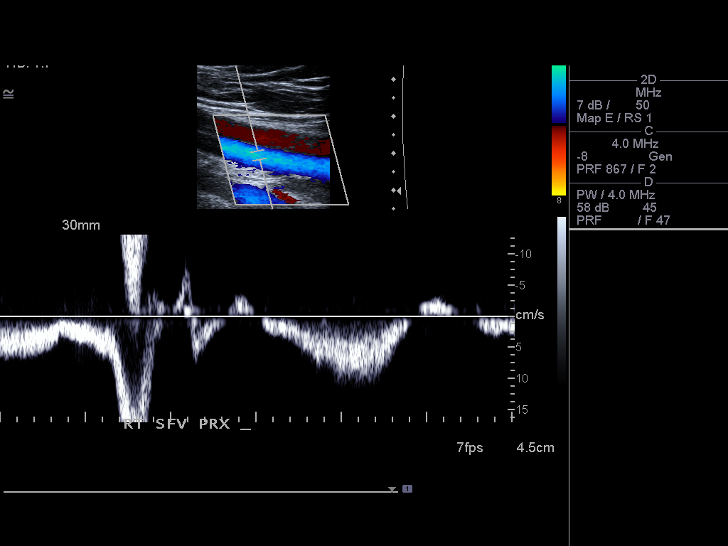
[im 14/27]
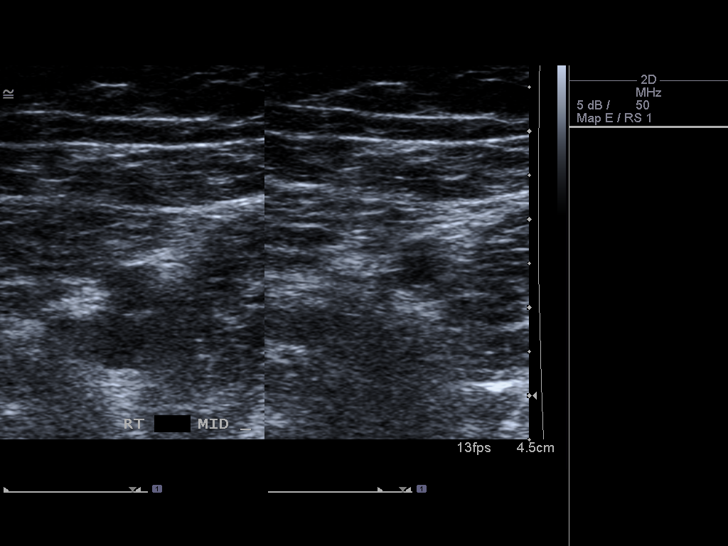
[im 16/27]
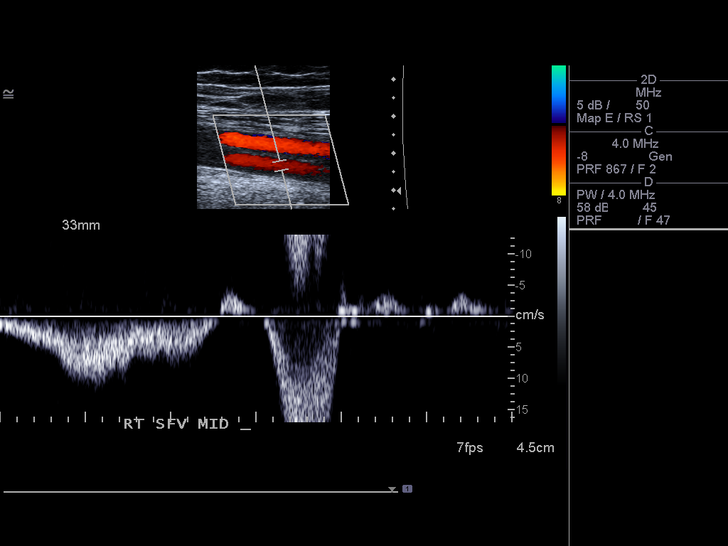
[im 19/27]
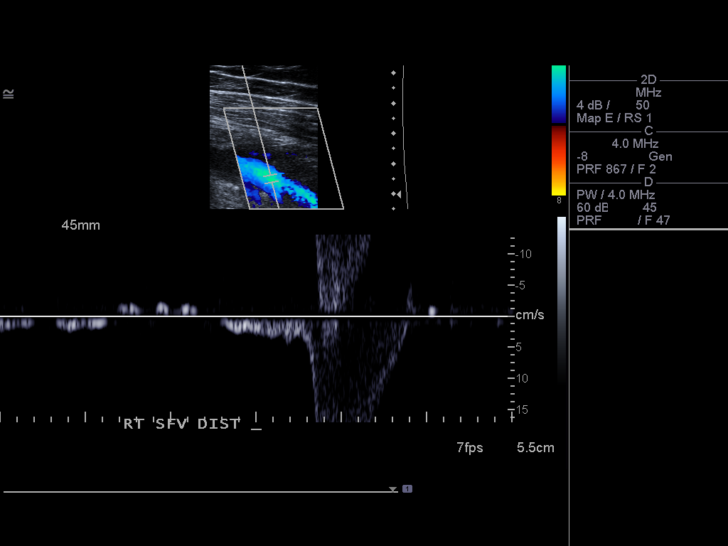
[im 21/27]
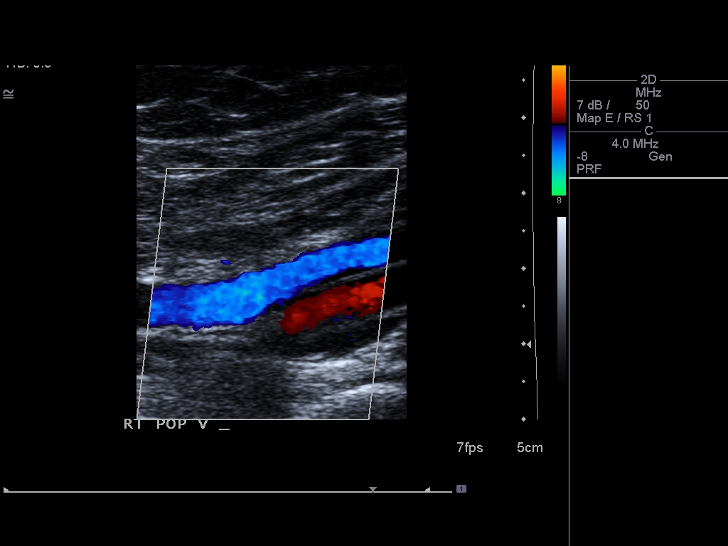
[im 22/27]
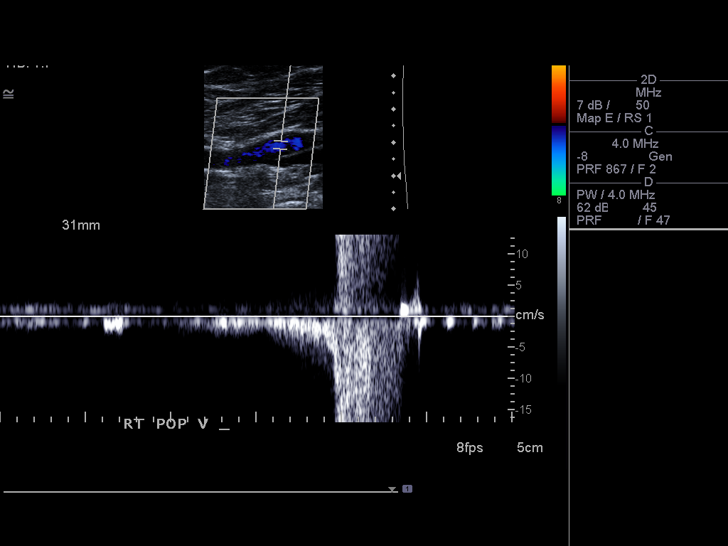
[im 24/27]
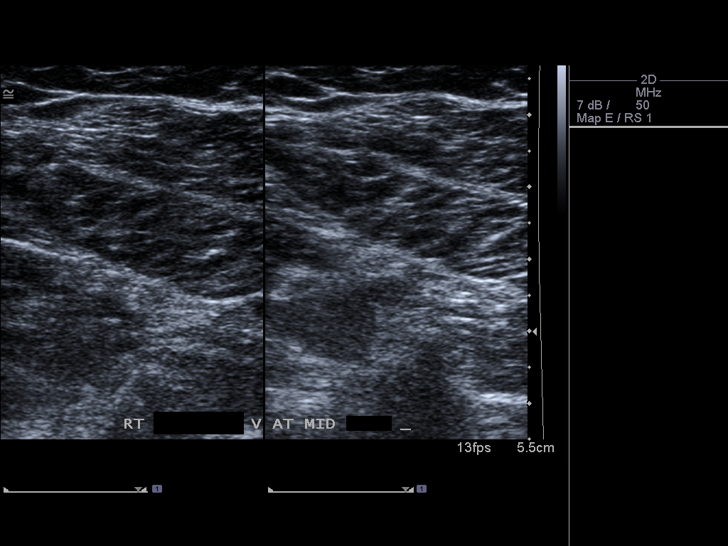
[im 27/27]
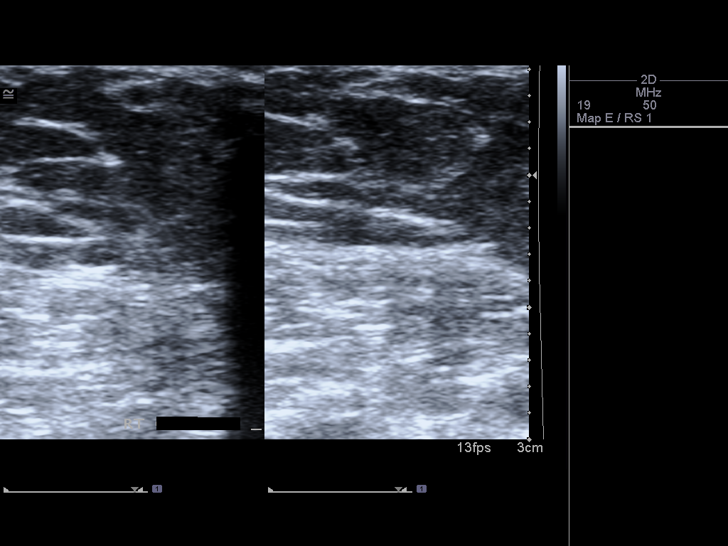

[14 of 24 positions shown; findings below may reference images not displayed]

FINDINGS: Normal compressibility of the common femoral,
superficial femoral, and popliteal veins is demonstrated, as well
as the visualized proximal calf veins.  No filling defects to
suggest DVT on grayscale or color Doppler imaging.  Doppler
waveforms show normal direction of venous flow, normal respiratory
phasicity and response to augmentation.
IMPRESSION: No evidence of lower extremity deep vein thrombosis.

## 2013-03-16 ENCOUNTER — Ambulatory Visit (INDEPENDENT_AMBULATORY_CARE_PROVIDER_SITE_OTHER): Payer: Managed Care, Other (non HMO) | Admitting: Family Medicine

## 2013-03-16 ENCOUNTER — Other Ambulatory Visit (HOSPITAL_COMMUNITY)
Admission: RE | Admit: 2013-03-16 | Discharge: 2013-03-16 | Disposition: A | Discharge status: Home / Self Care | Payer: Managed Care, Other (non HMO) | Source: Ambulatory Visit | Attending: Family Medicine | Admitting: Family Medicine

## 2013-03-16 ENCOUNTER — Encounter: Payer: Self-pay | Admitting: Family Medicine

## 2013-03-16 VITALS — BP 145/85 | HR 70 | Ht 62.5 in | Wt 146.0 lb

## 2013-03-16 DIAGNOSIS — N949 Unspecified condition associated with female genital organs and menstrual cycle: Secondary | ICD-10-CM

## 2013-03-16 DIAGNOSIS — B9689 Other specified bacterial agents as the cause of diseases classified elsewhere: Secondary | ICD-10-CM

## 2013-03-16 DIAGNOSIS — N898 Other specified noninflammatory disorders of vagina: Secondary | ICD-10-CM

## 2013-03-16 DIAGNOSIS — Z23 Encounter for immunization: Secondary | ICD-10-CM

## 2013-03-16 DIAGNOSIS — N760 Acute vaginitis: Secondary | ICD-10-CM

## 2013-03-16 DIAGNOSIS — A499 Bacterial infection, unspecified: Secondary | ICD-10-CM

## 2013-03-16 DIAGNOSIS — Z113 Encounter for screening for infections with a predominantly sexual mode of transmission: Secondary | ICD-10-CM | POA: Insufficient documentation

## 2013-03-16 LAB — POCT WET PREP (WET MOUNT): WBC, Wet Prep HPF POC: >20

## 2013-03-16 MED ORDER — CLINDAMYCIN HCL 300 MG PO CAPS: 300.0000 mg | ORAL_CAPSULE | Freq: Two times a day (BID) | ORAL | Status: DC

## 2013-03-16 NOTE — Patient Instructions (Signed)
We will call you with results.   Vaginitis Vaginitis is an inflammation of the vagina. It is most often caused by a change in the normal balance of the bacteria and yeast that live in the vagina. This change in balance causes an overgrowth of certain bacteria or yeast, which causes the inflammation. There are different types of vaginitis, but the most common types are:  Bacterial vaginosis.  Yeast infection (candidiasis).  Trichomoniasis vaginitis. This is a sexually transmitted infection (STI).  Viral vaginitis.  Atropic vaginitis.  Allergic vaginitis. CAUSES  The cause depends on the type of vaginitis. Vaginitis can be caused by:  Bacteria (bacterial vaginosis).  Yeast (yeast infection).  A parasite (trichomoniasis vaginitis)  A virus (viral vaginitis).  Low hormone levels (atrophic vaginitis). Low hormone levels can occur during pregnancy, breastfeeding, or after menopause.  Irritants, such as bubble baths, scented tampons, and feminine sprays (allergic vaginitis). Other factors can change the normal balance of the yeast and bacteria that live in the vagina. These include:  Antibiotic medicines.  Poor hygiene.  Diaphragms, vaginal sponges, spermicides, birth control pills, and intrauterine devices (IUD).  Sexual intercourse.  Infection.  Uncontrolled diabetes.  A weakened immune system. SYMPTOMS  Symptoms can vary depending on the cause of the vaginitis. Common symptoms include:  Abnormal vaginal discharge.  The discharge is white, gray, or yellow with bacterial vaginosis.  The discharge is thick, white, and cheesy with a yeast infection.  The discharge is frothy and yellow or greenish with trichomoniasis.  A bad vaginal odor.  The odor is fishy with bacterial vaginosis.  Vaginal itching, pain, or swelling.  Painful intercourse.  Pain or burning when urinating. Sometimes, there are no symptoms. TREATMENT  Treatment will vary depending on the type  of infection.   Bacterial vaginosis and trichomoniasis are often treated with antibiotic creams or pills.  Yeast infections are often treated with antifungal medicines, such as vaginal creams or suppositories.  Viral vaginitis has no cure, but symptoms can be treated with medicines that relieve discomfort. Your sexual partner should be treated as well.  Atrophic vaginitis may be treated with an estrogen cream, pill, suppository, or vaginal ring. If vaginal dryness occurs, lubricants and moisturizing creams may help. You may be told to avoid scented soaps, sprays, or douches.  Allergic vaginitis treatment involves quitting the use of the product that is causing the problem. Vaginal creams can be used to treat the symptoms. HOME CARE INSTRUCTIONS   Take all medicines as directed by your caregiver.  Keep your genital area clean and dry. Avoid soap and only rinse the area with water.  Avoid douching. It can remove the healthy bacteria in the vagina.  Do not use tampons or have sexual intercourse until your vaginitis has been treated. Use sanitary pads while you have vaginitis.  Wipe from front to back. This avoids the spread of bacteria from the rectum to the vagina.  Let air reach your genital area.  Wear cotton underwear to decrease moisture buildup.  Avoid wearing underwear while you sleep until your vaginitis is gone.  Avoid tight pants and underwear or nylons without a cotton panel.  Take off wet clothing (especially bathing suits) as soon as possible.  Use mild, non-scented products. Avoid using irritants, such as:  Scented feminine sprays.  Fabric softeners.  Scented detergents.  Scented tampons.  Scented soaps or bubble baths.  Practice safe sex and use condoms. Condoms may prevent the spread of trichomoniasis and viral vaginitis. SEEK MEDICAL CARE IF:  You have abdominal pain.  You have a fever or persistent symptoms for more than 2 3 days.  You have a fever  and your symptoms suddenly get worse. Document Released: 03/17/2007 Document Revised: 02/12/2012 Document Reviewed: 10/31/2011 [facility] Patient Information 2014 [place], [place].

## 2013-03-16 NOTE — Progress Notes (Signed)
Subjective:     Jenna Mcclure is a 32 y.o. female who presents for evaluation of an abnormal vaginal discharge. Symptoms have been present for several days. Vaginal symptoms: discharge described as white, odor and vulvar itching. Contraception: none. She denies abnormal bleeding, blisters, bumps, burning and urinary symptoms of dysuria and urinary frequency Sexually transmitted infection risk: very low risk of STD exposure. Does have history of gonorrhea but currently sexually active only with husband.  Menstrual flow: regular every 28-30 days.  Past medical history: Treated for BV and yeast infection about a month ago at urgent care GERD, hypertension, depression  Review of Systems Pertinent items are noted in HPI.    Objective:    General appearance: alert and cooperative Pelvic: cervix normal in appearance, external genitalia normal, no adnexal masses or tenderness, no cervical motion tenderness, uterus normal size, shape, and consistency and patient with small amounts of blood (on period currently)     Assessment:    Bacterial vaginosis.    Plan:    Oral antibiotics see orders.  Will also screen for gc/chlamydia. Had STD screen of HIV in July and Thinks likely had rpr at that time as well which she refuses today.   Health Maintenance Due  Topic Date Due  . Influenza Vaccine -today  01/01/2013  . Pap Smear -asked patient to send [place] records from Dr. Charlesetta Shanks who does regular pap smears 02/28/2013

## 2013-03-18 ENCOUNTER — Telehealth: Payer: Self-pay | Admitting: *Deleted

## 2013-03-18 NOTE — Telephone Encounter (Signed)
Message copied by Henri Medal on Thu Mar 18, 2013  2:46 PM ------      Message from: Shelva Majestic      Created: Tue Mar 16, 2013  6:20 PM       LVM for patient to call our office.  Phoned in clindamycin for BV. ------

## 2013-03-18 NOTE — Telephone Encounter (Signed)
Pt was made aware in clinic. Jazmin Hartsell,CMA

## 2013-03-18 NOTE — Telephone Encounter (Signed)
Message copied by Henri Medal on Thu Mar 18, 2013  2:47 PM ------      Message from: Shelva Majestic      Created: Thu Mar 18, 2013  9:17 AM       GC/chlamydia negative. Please inform patient. ------

## 2013-03-18 NOTE — Telephone Encounter (Signed)
Pt is aware.  Jazmin Hartsell,CMA  

## 2013-04-19 ENCOUNTER — Ambulatory Visit: Payer: Managed Care, Other (non HMO) | Admitting: Family Medicine

## 2013-04-24 ENCOUNTER — Encounter (HOSPITAL_COMMUNITY): Payer: Self-pay | Admitting: *Deleted

## 2013-04-24 ENCOUNTER — Inpatient Hospital Stay (HOSPITAL_COMMUNITY)
Admission: AD | Admit: 2013-04-24 | Discharge: 2013-04-24 | Disposition: A | Discharge status: Home / Self Care | Payer: Managed Care, Other (non HMO) | Source: Ambulatory Visit | Attending: Obstetrics and Gynecology | Admitting: Obstetrics and Gynecology

## 2013-04-24 ENCOUNTER — Inpatient Hospital Stay (HOSPITAL_COMMUNITY): Payer: Managed Care, Other (non HMO)

## 2013-04-24 DIAGNOSIS — O3680X1 Pregnancy with inconclusive fetal viability, fetus 1: Secondary | ICD-10-CM

## 2013-04-24 DIAGNOSIS — O200 Threatened abortion: Secondary | ICD-10-CM

## 2013-04-24 DIAGNOSIS — R1031 Right lower quadrant pain: Secondary | ICD-10-CM

## 2013-04-24 DIAGNOSIS — Z3490 Encounter for supervision of normal pregnancy, unspecified, unspecified trimester: Secondary | ICD-10-CM

## 2013-04-24 DIAGNOSIS — O468X9 Other antepartum hemorrhage, unspecified trimester: Secondary | ICD-10-CM | POA: Insufficient documentation

## 2013-04-24 DIAGNOSIS — R109 Unspecified abdominal pain: Secondary | ICD-10-CM | POA: Insufficient documentation

## 2013-04-24 LAB — WET PREP, GENITAL
Trich, Wet Prep: NONE SEEN
Yeast Wet Prep HPF POC: NONE SEEN

## 2013-04-24 LAB — URINALYSIS, ROUTINE W REFLEX MICROSCOPIC
Hgb urine dipstick: NEGATIVE
Leukocytes, UA: NEGATIVE
Nitrite: NEGATIVE
Specific Gravity, Urine: 1.025 (ref 1.005–1.030)
Urobilinogen, UA: 0.2 mg/dL (ref 0.0–1.0)

## 2013-04-24 LAB — POCT PREGNANCY, URINE: Preg Test, Ur: POSITIVE — AB

## 2013-04-24 NOTE — MAU Provider Note (Signed)
History     CSN: [id number]  Arrival date and time: 04/24/13 4098   First Provider Initiated Contact with Patient 04/24/13 [phone]      Chief Complaint  Patient presents with  . Abdominal Cramping   HPI Ms. Jenna Mcclure is a 32 y.o. G7P5005 at [redacted]w[redacted]d who presents to MAU today with complaint of lower abdominal pain, worse on the right side x 2 days. She rates her pain at 7/10 now. She took tylenol this morning without relief. She denies vaginal bleeding, discharge, UTI symptoms or fever. She is having nausea without vomiting, diarrhea or constipation. +HPT. LMP was in early October, unsure of exact date. Patient was on OCPs until last week.   OB History   Grav Para Term Preterm Abortions TAB SAB Ect Mult Living   7 5 5  0 0 0 0 0 0 5      Past Medical History  Diagnosis Date  . Hypertension   . Bilateral ovarian cysts   . OBESITY 12/01/2008    Qualifier: Diagnosis of  By: Sandi Mealy  MD, Judeth Cornfield    . Palpitations 02/28/2010    Qualifier: Diagnosis of  Problem Stop Reason:  By: Lelon Perla MD, Vickki Muff    . RHINITIS, ALLERGIC 07/31/2006    Qualifier: Diagnosis of  By: Knox Royalty    . Abnormal Pap smear     normal for the last 6 yrs  . Hx of gonorrhea   . GERD (gastroesophageal reflux disease)   . Heart murmur     staes has heart murmur but - echo and - EKG    Past Surgical History  Procedure Laterality Date  . Laparoscopy      Family History  Problem Relation Age of Onset  . Hypertension Mother   . Hypertension Maternal Aunt   . Diabetes Maternal Uncle   . Diabetes Maternal Grandmother   . Diabetes Paternal Grandmother     History  Substance Use Topics  . Smoking status: Never Smoker   . Smokeless tobacco: Never Used  . Alcohol Use: No    Allergies:  Allergies  Allergen Reactions  . Miconazole Nitrate Hives, Itching and Rash    Patient states she used this twice and "broke out and the symptoms got worse"    Prescriptions prior to admission  Medication Sig Dispense  Refill  . acetaminophen (TYLENOL) 500 MG tablet Take 500 mg by mouth every 6 (six) hours as needed for mild pain, moderate pain or headache.       . norethindrone (MICRONOR,CAMILA,ERRIN) 0.35 MG tablet Take 1 tablet by mouth daily.        Review of Systems  Constitutional: Negative for fever and malaise/fatigue.  Gastrointestinal: Positive for nausea and abdominal pain. Negative for vomiting, diarrhea and constipation.  Genitourinary: Negative for dysuria, urgency and frequency.       Neg - vaginal bleeding, discharge  Neurological: Negative for dizziness, loss of consciousness and weakness.   Physical Exam   Blood pressure 119/70, pulse 94, temperature 98.4 F (36.9 C), resp. rate 18, height 5\' 2"  (1.575 m), weight 150 lb (68.04 kg), last menstrual period 03/08/2013.  Physical Exam  Constitutional: She is oriented to person, place, and time. She appears well-developed and well-nourished. No distress.  HENT:  Head: Normocephalic and atraumatic.  Cardiovascular: Normal rate.   Respiratory: Effort normal.  Genitourinary: Uterus is enlarged and tender (mild). Cervix exhibits no motion tenderness, no discharge and no friability. Right adnexum displays tenderness (mild to moderate). Right adnexum displays  no mass. Left adnexum displays no mass and no tenderness. No bleeding around the vagina. Vaginal discharge (scant thin, white discharge noted) found.  Neurological: She is oriented to person, place, and time.  Skin: Skin is warm and dry. No erythema.  Psychiatric: She has a normal mood and affect.   Results for orders placed during the hospital encounter of 04/24/13 (from the past 24 hour(s))  URINALYSIS, ROUTINE W REFLEX MICROSCOPIC     Status: None   Collection Time    04/24/13  6:55 AM      Result Value Range   Color, Urine YELLOW  YELLOW   APPearance CLEAR  CLEAR   Specific Gravity, Urine 1.025  1.005 - 1.030   pH 6.0  5.0 - 8.0   Glucose, UA NEGATIVE  NEGATIVE mg/dL   Hgb urine  dipstick NEGATIVE  NEGATIVE   Bilirubin Urine NEGATIVE  NEGATIVE   Ketones, ur NEGATIVE  NEGATIVE mg/dL   Protein, ur NEGATIVE  NEGATIVE mg/dL   Urobilinogen, UA 0.2  0.0 - 1.0 mg/dL   Nitrite NEGATIVE  NEGATIVE   Leukocytes, UA NEGATIVE  NEGATIVE  POCT PREGNANCY, URINE     Status: Abnormal   Collection Time    04/24/13  7:01 AM      Result Value Range   Preg Test, Ur POSITIVE (*) NEGATIVE  WET PREP, GENITAL     Status: Abnormal   Collection Time    04/24/13  7:20 AM      Result Value Range   Yeast Wet Prep HPF POC NONE SEEN  NONE SEEN   Trich, Wet Prep NONE SEEN  NONE SEEN   Clue Cells Wet Prep HPF POC NONE SEEN  NONE SEEN   WBC, Wet Prep HPF POC MODERATE (*) NONE SEEN  HCG, QUANTITATIVE, PREGNANCY     Status: Abnormal   Collection Time    04/24/13  7:30 AM      Result Value Range   hCG, Beta Chain, Quant, S 8498 (*) <5 mIU/mL   US Ob Comp Less 14 Wks  04/24/2013   CLINICAL DATA:  Abdominal pain. Six weeks estimated gestational age.  EXAM: OBSTETRIC <14 WK [place] AND TRANSVAGINAL OB US  TECHNIQUE: Both transabdominal and transvaginal ultrasound examinations were performed for complete evaluation of the gestation as well as the maternal uterus, adnexal regions, and pelvic cul-de-sac. Transvaginal technique was performed to assess early pregnancy.  COMPARISON:  None.  FINDINGS: Intrauterine gestational sac: Visualized.  Normal in shape.  Yolk sac:  Present  Embryo:  No  Cardiac Activity: No  Heart Rate:  No bpm  MSD:  11  mm   5 w   5  d  Maternal uterus/adnexae:  Subchorionic hemorrhage: Small subchorionic hemorrhage measures 2.7 x 0.6 cm.  Right ovary: Normal  Left ovary: Normal  Other :None  Free fluid: None  IMPRESSION: 1. Single intrauterine gestational sac containing a yolk sac but no embryo. Findings are suspicious but not yet definitive for failed pregnancy. Recommend follow-up US in 10-14 days for definitive diagnosis. This recommendation follows SRU consensus guidelines: Diagnostic  Criteria for Nonviable Pregnancy Early in the First Trimester. Malva Limes Med 2013; 161:0960-45.  2. Subchorionic hemorrhage.   Electronically Signed   By: Signa Kell M.D.   On: 04/24/2013 08:19   US Ob Transvaginal  04/24/2013   CLINICAL DATA:  Abdominal pain. Six weeks estimated gestational age.  EXAM: OBSTETRIC <14 WK [place] AND TRANSVAGINAL OB US  TECHNIQUE: Both transabdominal and transvaginal  ultrasound examinations were performed for complete evaluation of the gestation as well as the maternal uterus, adnexal regions, and pelvic cul-de-sac. Transvaginal technique was performed to assess early pregnancy.  COMPARISON:  None.  FINDINGS: Intrauterine gestational sac: Visualized.  Normal in shape.  Yolk sac:  Present  Embryo:  No  Cardiac Activity: No  Heart Rate:  No bpm  MSD:  11  mm   5 w   5  d  Maternal uterus/adnexae:  Subchorionic hemorrhage: Small subchorionic hemorrhage measures 2.7 x 0.6 cm.  Right ovary: Normal  Left ovary: Normal  Other :None  Free fluid: None  IMPRESSION: 1. Single intrauterine gestational sac containing a yolk sac but no embryo. Findings are suspicious but not yet definitive for failed pregnancy. Recommend follow-up US in 10-14 days for definitive diagnosis. This recommendation follows SRU consensus guidelines: Diagnostic Criteria for Nonviable Pregnancy Early in the First Trimester. Malva Limes Med 2013; 409:8119-14.  2. Subchorionic hemorrhage.   Electronically Signed   By: Signa Kell M.D.   On: 04/24/2013 08:19    MAU Course  Procedures None  MDM +UPT UA, Wet prep and GC/Chlamydia Discussed patient with Dr. Renaldo Fiddler. Quant hCG and [place] today 0800 - Care turned over to Venia Carbon, NP  Freddi Starr, PA-C  04/24/2013, 8:09 AM   Consulted with Dr. Vickey Sages regarding [place] report.  Assessment and Plan   A: Single intrauterine gestational sac: [redacted]w[redacted]d Small subchorionic hemorrhage  Abdominal pain in pregnancy  P: Discharge home Discussed subchorionic hemorrhage  and warning signs in the first trimester Return to MAU as needed, if symptoms worsen.  Pelvic rest until pain subsides Call Dr. Renaldo Fiddler office on Monday to schedule a follow up appt.

## 2013-04-24 NOTE — MAU Note (Signed)
Having some upper abd pain and esp pain RLQ for 2 days. Crampy pain which comes and goes. No bleeding or d/c. Some nausea but no vomiting or diarrhea

## 2013-04-24 NOTE — MAU Note (Signed)
Report called to [place] Either PA.  Will see pt

## 2013-04-27 LAB — GC/CHLAMYDIA PROBE AMP: CT Probe RNA: NEGATIVE

## 2013-05-23 ENCOUNTER — Inpatient Hospital Stay (HOSPITAL_COMMUNITY)
Admission: AD | Admit: 2013-05-23 | Discharge: 2013-05-23 | Disposition: A | Discharge status: Home / Self Care | Payer: Managed Care, Other (non HMO) | Source: Ambulatory Visit | Attending: Obstetrics & Gynecology | Admitting: Obstetrics & Gynecology

## 2013-05-23 ENCOUNTER — Encounter (HOSPITAL_COMMUNITY): Payer: Self-pay | Admitting: *Deleted

## 2013-05-23 DIAGNOSIS — R05 Cough: Secondary | ICD-10-CM | POA: Insufficient documentation

## 2013-05-23 DIAGNOSIS — R059 Cough, unspecified: Secondary | ICD-10-CM | POA: Insufficient documentation

## 2013-05-23 DIAGNOSIS — J069 Acute upper respiratory infection, unspecified: Secondary | ICD-10-CM | POA: Insufficient documentation

## 2013-05-23 DIAGNOSIS — O99891 Other specified diseases and conditions complicating pregnancy: Secondary | ICD-10-CM | POA: Insufficient documentation

## 2013-05-23 DIAGNOSIS — R509 Fever, unspecified: Secondary | ICD-10-CM | POA: Insufficient documentation

## 2013-05-23 DIAGNOSIS — J029 Acute pharyngitis, unspecified: Secondary | ICD-10-CM | POA: Insufficient documentation

## 2013-05-23 LAB — URINALYSIS, ROUTINE W REFLEX MICROSCOPIC
Glucose, UA: NEGATIVE mg/dL
Hgb urine dipstick: NEGATIVE
Ketones, ur: 15 mg/dL — AB
Leukocytes, UA: NEGATIVE
Protein, ur: NEGATIVE mg/dL
Specific Gravity, Urine: 1.025 (ref 1.005–1.030)
pH: 5.5 (ref 5.0–8.0)

## 2013-05-23 MED ORDER — OSELTAMIVIR PHOSPHATE 75 MG PO CAPS: 75.0000 mg | ORAL_CAPSULE | Freq: Two times a day (BID) | ORAL | Status: DC

## 2013-05-23 NOTE — MAU Provider Note (Signed)
History     CSN: [id number]  Arrival date and time: 05/23/13 5188   First Provider Initiated Contact with Patient 05/23/13 1936      No chief complaint on file.  HPI  Jenna Mcclure is a 32 y.o. G7P5005 at [redacted]w[redacted]d who presents today with body aches, chills, sore throat, cough and fever since last night. She has been taking tylenol for pain and fever at home. She has not taken her temp at home, but she has had chills.   Past Medical History  Diagnosis Date  . Hypertension   . Bilateral ovarian cysts   . OBESITY 12/01/2008    Qualifier: Diagnosis of  By: Sandi Mealy  MD, Judeth Cornfield    . Palpitations 02/28/2010    Qualifier: Diagnosis of  Problem Stop Reason:  By: Lelon Perla MD, Vickki Muff    . RHINITIS, ALLERGIC 07/31/2006    Qualifier: Diagnosis of  By: Knox Royalty    . Abnormal Pap smear     normal for the last 6 yrs  . Hx of gonorrhea   . GERD (gastroesophageal reflux disease)   . Heart murmur     staes has heart murmur but - echo and - EKG    Past Surgical History  Procedure Laterality Date  . Laparoscopy      Family History  Problem Relation Age of Onset  . Hypertension Mother   . Hypertension Maternal Aunt   . Diabetes Maternal Uncle   . Diabetes Maternal Grandmother   . Diabetes Paternal Grandmother     History  Substance Use Topics  . Smoking status: Never Smoker   . Smokeless tobacco: Never Used  . Alcohol Use: No    Allergies:  Allergies  Allergen Reactions  . Miconazole Nitrate Hives, Itching and Rash    Patient states she used this twice and "broke out and the symptoms got worse"    Prescriptions prior to admission  Medication Sig Dispense Refill  . acetaminophen (TYLENOL) 500 MG tablet Take 500 mg by mouth every 6 (six) hours as needed for mild pain, moderate pain or headache.       . Docosahexaenoic Acid (DHA) 200 MG CAPS Take 1 tablet by mouth daily.      . ondansetron (ZOFRAN-ODT) 8 MG disintegrating tablet Take 8 mg by mouth every 8 (eight) hours as needed  for nausea or vomiting.      . Prenatal Vit-Fe Fumarate-FA (PRENATAL MULTIVITAMIN) TABS tablet Take 1 tablet by mouth daily at 12 noon.        ROS Physical Exam   Blood pressure 120/77, pulse 117, temperature 99.2 F (37.3 C), temperature source Oral, resp. rate 18, height 5\' 2"  (1.575 m), weight 70.035 kg (154 lb 6.4 oz), last menstrual period 03/08/2013.  Physical Exam  MAU Course  Procedures    Assessment and Plan   1. Viral URI    -cannot exclude flu Flu PCR pending at this time Will call for results     Medication List         acetaminophen 500 MG tablet  Commonly known as:  TYLENOL  Take 500 mg by mouth every 6 (six) hours as needed for mild pain, moderate pain or headache.     DHA 200 MG Caps  Take 1 tablet by mouth daily.     ondansetron 8 MG disintegrating tablet  Commonly known as:  ZOFRAN-ODT  Take 8 mg by mouth every 8 (eight) hours as needed for nausea or vomiting.     oseltamivir  75 MG capsule  Commonly known as:  TAMIFLU  Take 1 capsule (75 mg total) by mouth 2 (two) times daily.     prenatal multivitamin Tabs tablet  Take 1 tablet by mouth daily at 12 noon.       Follow-up Information   Follow up with Meriel Pica, MD. (as planned )    Specialty:  Obstetrics and Gynecology   Contact information:   [address] [place] [address] [phone]        Tawnya Crook 05/23/2013, 7:37 PM

## 2013-05-23 NOTE — MAU Note (Signed)
Symptoms started last night. Has been coughing, shaking, having body aches, headache, hurts when swallows. Stomach hurts when coughs. Also, having sharp pains that come and go in abdomen today. Does not know if she has a fever. Did take 500mg  Tylenol ES today at 6:00pm. Daughter and son had upper respiratory infections last week. Son also has an ear infection. Both had fevers. Son is on antibiotics currently. Daughter is not.  Both are doing better. Also, is nauseated but has had that with her pregnancy. Today does not have an appetite.

## 2013-05-24 ENCOUNTER — Telehealth (HOSPITAL_COMMUNITY): Payer: Self-pay | Admitting: *Deleted

## 2013-05-24 LAB — INFLUENZA PANEL BY PCR (TYPE A & B)
H1N1 flu by pcr: NOT DETECTED
Influenza A By PCR: NEGATIVE
Influenza B By PCR: NEGATIVE

## 2013-06-03 ENCOUNTER — Inpatient Hospital Stay (HOSPITAL_COMMUNITY)
Admission: AD | Admit: 2013-06-03 | Discharge: 2013-06-03 | Disposition: A | Discharge status: Home / Self Care | Payer: Managed Care, Other (non HMO) | Source: Ambulatory Visit | Attending: Obstetrics and Gynecology | Admitting: Obstetrics and Gynecology

## 2013-06-03 ENCOUNTER — Encounter (HOSPITAL_COMMUNITY): Payer: Self-pay | Admitting: *Deleted

## 2013-06-03 DIAGNOSIS — R109 Unspecified abdominal pain: Secondary | ICD-10-CM | POA: Insufficient documentation

## 2013-06-03 DIAGNOSIS — O360990 Maternal care for other rhesus isoimmunization, unspecified trimester, not applicable or unspecified: Secondary | ICD-10-CM | POA: Insufficient documentation

## 2013-06-03 DIAGNOSIS — O200 Threatened abortion: Secondary | ICD-10-CM

## 2013-06-03 DIAGNOSIS — O209 Hemorrhage in early pregnancy, unspecified: Secondary | ICD-10-CM | POA: Insufficient documentation

## 2013-06-03 LAB — URINALYSIS, ROUTINE W REFLEX MICROSCOPIC
Bilirubin Urine: NEGATIVE
GLUCOSE, UA: NEGATIVE mg/dL
Ketones, ur: NEGATIVE mg/dL
Leukocytes, UA: NEGATIVE
Nitrite: NEGATIVE
Protein, ur: NEGATIVE mg/dL
SPECIFIC GRAVITY, URINE: 1.015 (ref 1.005–1.030)
UROBILINOGEN UA: 1 mg/dL (ref 0.0–1.0)
pH: 8.5 — ABNORMAL HIGH (ref 5.0–8.0)

## 2013-06-03 LAB — URINE MICROSCOPIC-ADD ON

## 2013-06-03 LAB — WET PREP, GENITAL
Clue Cells Wet Prep HPF POC: NONE SEEN
Trich, Wet Prep: NONE SEEN

## 2013-06-03 MED ORDER — RHO D IMMUNE GLOBULIN 1500 UNIT/2ML IJ SOLN
300.0000 ug | Freq: Once | INTRAMUSCULAR | Status: AC
  Administered 2013-06-03: 300 ug via INTRAMUSCULAR
  Filled 2013-06-03: qty 2

## 2013-06-03 NOTE — MAU Provider Note (Signed)
CSN: [id number]     Arrival date & time 06/03/13  1042 History   None    Chief Complaint  Patient presents with  . Vaginal Bleeding   (Consider location/radiation/quality/duration/timing/severity/associated sxs/prior Treatment) HPI Jenna Mcclure is a 33 y.o. G 6, P 5 @ [redacted]w[redacted]d gestation who presents to the MAU with vaginal bleeding. The bleeding started this morning. The bleeding is dark brown. She has mild cramping. She had an ultrasound here when she was [redacted] weeks pregnant and was told there was bleeding around the pregnancy. She has had no problem since then until today. Last sexual intercourse was about a week ago. She is a patient of Dr. Matthew Saras and has an appointment for next week.   Past Medical History  Diagnosis Date  . Hypertension   . Bilateral ovarian cysts   . OBESITY 12/01/2008    Qualifier: Diagnosis of  By: Carlena Sax  MD, Colletta Maryland    . Palpitations 02/28/2010    Qualifier: Diagnosis of  Problem Stop Reason:  By: Tye Savoy MD, Tommi Rumps    . RHINITIS, ALLERGIC 07/31/2006    Qualifier: Diagnosis of  By: Samara Snide    . Abnormal Pap smear     normal for the last 6 yrs  . Hx of gonorrhea   . GERD (gastroesophageal reflux disease)   . Heart murmur     staes has heart murmur but - echo and - EKG   Past Surgical History  Procedure Laterality Date  . Laparoscopy     Family History  Problem Relation Age of Onset  . Hypertension Mother   . Hypertension Maternal Aunt   . Diabetes Maternal Uncle   . Diabetes Maternal Grandmother   . Diabetes Paternal Grandmother    History  Substance Use Topics  . Smoking status: Never Smoker   . Smokeless tobacco: Never Used  . Alcohol Use: No   OB History   Grav Para Term Preterm Abortions TAB SAB Ect Mult Living   6 5 5  0 0 0 0 0 0 5     Review of Systems  Constitutional: Negative for fever and chills.  HENT: Negative.   Respiratory: Negative for shortness of breath.   Cardiovascular: Negative for chest pain.  Gastrointestinal: Negative  for nausea and vomiting. Abdominal pain: cramping.  Genitourinary: Positive for vaginal bleeding. Negative for dysuria and urgency.  Musculoskeletal: Negative for back pain.  Skin: Negative for rash.  Neurological: Negative for syncope.  Psychiatric/Behavioral: The patient is not nervous/anxious.     Allergies  Miconazole nitrate  Home Medications  No current outpatient prescriptions on file. BP 123/78  Pulse 82  Temp(Src) 98.6 F (37 C) (Oral)  Resp 18  Ht 5\' 2"  (1.575 m)  Wt 152 lb 9.6 oz (69.219 kg)  BMI 27.90 kg/m2  SpO2 100%  LMP 03/08/2013 Physical Exam  Nursing note and vitals reviewed. Constitutional: She is oriented to person, place, and time. She appears well-developed and well-nourished. No distress.  HENT:  Head: Atraumatic.  Eyes: EOM are normal.  Neck: Neck supple.  Cardiovascular: Normal rate.   Pulmonary/Chest: Effort normal.  Abdominal: Soft. There is no tenderness.  Positive FHT's 160  Genitourinary:  External genitalia without lesions, brown discharge vaginal vault. Cervix closed. No CMT, no adnexal tenderness. Uterus approximately 12 week size.   Musculoskeletal: Normal range of motion.  Neurological: She is alert and oriented to person, place, and time. No cranial nerve deficit.  Skin: Skin is warm and dry.  Psychiatric: She has a  normal mood and affect. Her behavior is normal.    ED Course: Consult with Dr. Radene Knee will have patient follow up in the office as scheduled next week. She will return here for worsening symptoms.   Procedures   MDM  33 y.o. female with vaginal bleeding in pregnancy @ [redacted]w[redacted]d. Known [place] on previous ultrasound.  Discussed with the patient threatened AB. I have reviewed this patient's vital signs, nurses notes, appropriate labs and imaging.  I have discussed findings with the patient and plan of care. She is stable for discharge without signs of hemorrhage or miscarriage at this time. Positive FHT's, cervix closed.  Patient  with known Rh negative blood type. Rhogam work up ordered. Will give Rhogam prior to discharge.     Medication List    ASK your doctor about these medications       acetaminophen 500 MG tablet  Commonly known as:  TYLENOL  Take 500 mg by mouth every 6 (six) hours as needed for mild pain, moderate pain or headache.     DHA 200 MG Caps  Take 1 tablet by mouth daily.     DICLEGIS 10-10 MG Tbec  Generic drug:  Doxylamine-Pyridoxine  Take 1 tablet by mouth daily.     ondansetron 8 MG disintegrating tablet  Commonly known as:  ZOFRAN-ODT  Take 8 mg by mouth every 8 (eight) hours as needed for nausea or vomiting.     oseltamivir 75 MG capsule  Commonly known as:  TAMIFLU  Take 1 capsule (75 mg total) by mouth 2 (two) times daily.     prenatal multivitamin Tabs tablet  Take 1 tablet by mouth daily at 12 noon.

## 2013-06-03 NOTE — L&D Delivery Note (Signed)
SVD of VFI at 1426 on 12/13/13.  EBL 300cc.  Placenta to L&D. Head delivered LOP.  Body followed atraumatically.  Mouth and nose were bulb suctioned. Cord was clamped, cut, and baby to abdomen.  Cord blood was obtained. Placenta delivered S/I/3VC. Fundus was firmed with pitocin and massage.  Perineum intact.  Mom and baby stable.   Linda Hedges, DO

## 2013-06-03 NOTE — MAU Note (Signed)
Patient presents to MAU with c/o vaginal bleeding that started this am; patient noted red and brown when wipes. Not currently having to wear a pad. Reports lower abdominal cramping.

## 2013-06-03 NOTE — Discharge Instructions (Signed)
Threatened Miscarriage  Bleeding during the first 20 weeks of pregnancy is common. This is sometimes called a threatened miscarriage. This is a pregnancy that is threatening to end before the twentieth week of pregnancy. Often this bleeding stops with bed rest or decreased activities as suggested by your caregiver and the pregnancy continues without any more problems. You may be asked to not have sexual intercourse, have orgasms or use tampons until further notice. Sometimes a threatened miscarriage can progress to a complete or incomplete miscarriage. This may or may not require further treatment. Some miscarriages occur before a woman misses a menstrual period and knows she is pregnant.  Miscarriages occur in 15 to 20% of all pregnancies and usually occur during the first 13 weeks of the pregnancy. The exact cause of a miscarriage is usually never known. A miscarriage is natures way of ending a pregnancy that is abnormal or would not make it to term. There are some things that may put you at risk to have a miscarriage, such as:  · Hormone problems.  · Infection of the uterus or cervix.  · Chronic illness, diabetes for example, especially if it is not controlled.  · Abnormal shaped uterus.  · Fibroids in the uterus.  · Incompetent cervix (the cervix is too weak to hold the baby).  · Smoking.  · Drinking too much alcohol. It's best not to drink any alcohol when you are pregnant.  · Taking illegal drugs.  TREATMENT   When a miscarriage becomes complete and all products of conception (all the tissue in the uterus) have been passed, often no treatment is needed. If you think you passed tissue, save it in a container and take it to your doctor for evaluation. If the miscarriage is incomplete (parts of the fetus or placenta remain in the uterus), further treatment may be needed. The most common reason for further treatment is continued bleeding (hemorrhage) because pregnancy tissue did not pass out of the uterus. This  often occurs if a miscarriage is incomplete. Tissue left behind may also become infected. Treatment usually is dilatation and curettage (the removal of the remaining products of pregnancy. This can be done by a simple sucking procedure (suction curettage) or a simple scraping of the inside of the uterus. This may be done in the hospital or in the caregiver's office. This is only done when your caregiver knows that there is no chance for the pregnancy to proceed to term. This is determined by physical examination, negative pregnancy test, falling pregnancy hormone count and/or, an ultrasound revealing a dead fetus.  Miscarriages are often a very emotional time for prospective mothers and fathers. This is not you or your partners fault. It did not occur because of an inadequacy in you or your partner. Nearly all miscarriages occur because the pregnancy has started off wrongly. At least half of these pregnancies have a chromosomal abnormality. It is almost always not inherited. Others may have developmental problems with the fetus or placenta. This does not always show up even when the products miscarried are studied under the microscope. The miscarriage is nearly always not your fault and it is not likely that you could have prevented it from happening. If you are having emotional and grieving problems, talk to your health care provider and even seek counseling, if necessary, before getting pregnant again. You can begin trying for another pregnancy as soon as your caregiver says it is OK.  HOME CARE INSTRUCTIONS   · Your caregiver may order   bed rest depending on how much bleeding and cramping you are having. You may be limited to only getting up to go to the bathroom. You may be allowed to continue light activity. You may need to make arrangements for the care of your other children and for any other responsibilities.  · Keep track of the number of pads you use each day, how often you have to change pads and how  saturated (soaked) they are. Record this information.  · DO NOT USE TAMPONS. Do not douche, have sexual intercourse or orgasms until approved by your caregiver.  · You may receive a follow up appointment for re-evaluation of your pregnancy and a repeat blood test. Re-evaluation often occurs after 2 days and again in 4 to 6 weeks. It is very important that you follow-up in the recommended time period.  · If you are Rh negative and the father is Rh positive or you do not know the fathers' blood type, you may receive a shot (Rh immune globulin) to help prevent abnormal antibodies that can develop and affect the baby in any future pregnancies.  SEEK IMMEDIATE MEDICAL CARE IF:  · You have severe cramps in your stomach, back, or abdomen.  · You have a sudden onset of severe pain in the lower part of your abdomen.  · You develop chills.  · You run an unexplained temperature of 101° F (38.3° C) or higher.  · You pass large clots or tissue. Save any tissue for your caregiver to inspect.  · Your bleeding increases or you become light-headed, weak, or have fainting episodes.  · You have a gush of fluid from your vagina.  · You pass out. This could mean you have a tubal (ectopic) pregnancy.  Document Released: 05/20/2005 Document Revised: 08/12/2011 Document Reviewed: 01/04/2008  [employer]® Patient Information ©2014 [employer].

## 2013-06-04 LAB — RH IG WORKUP (INCLUDES ABO/RH)
ABO/RH(D): B NEG
Antibody Screen: NEGATIVE
GESTATIONAL AGE(WKS): 12.2
UNIT DIVISION: 0

## 2013-06-06 LAB — GC/CHLAMYDIA PROBE AMP
CT Probe RNA: NEGATIVE
GC Probe RNA: NEGATIVE

## 2013-06-16 LAB — OB RESULTS CONSOLE RPR: RPR: NONREACTIVE

## 2013-06-16 LAB — OB RESULTS CONSOLE ABO/RH: RH Type: NEGATIVE

## 2013-06-16 LAB — OB RESULTS CONSOLE HEPATITIS B SURFACE ANTIGEN: Hepatitis B Surface Ag: NEGATIVE

## 2013-06-16 LAB — OB RESULTS CONSOLE ANTIBODY SCREEN: ANTIBODY SCREEN: POSITIVE

## 2013-06-16 LAB — OB RESULTS CONSOLE GC/CHLAMYDIA
CHLAMYDIA, DNA PROBE: NEGATIVE
GC PROBE AMP, GENITAL: NEGATIVE

## 2013-06-16 LAB — OB RESULTS CONSOLE HIV ANTIBODY (ROUTINE TESTING): HIV: NONREACTIVE

## 2013-06-16 LAB — OB RESULTS CONSOLE RUBELLA ANTIBODY, IGM: Rubella: IMMUNE

## 2013-06-17 ENCOUNTER — Encounter (HOSPITAL_COMMUNITY): Payer: Self-pay | Admitting: *Deleted

## 2013-06-17 ENCOUNTER — Inpatient Hospital Stay (HOSPITAL_COMMUNITY)
Admission: AD | Admit: 2013-06-17 | Discharge: 2013-06-17 | Disposition: A | Discharge status: Home / Self Care | Payer: Managed Care, Other (non HMO) | Source: Ambulatory Visit | Attending: Obstetrics & Gynecology | Admitting: Obstetrics & Gynecology

## 2013-06-17 DIAGNOSIS — IMO0002 Reserved for concepts with insufficient information to code with codable children: Secondary | ICD-10-CM | POA: Insufficient documentation

## 2013-06-17 DIAGNOSIS — O210 Mild hyperemesis gravidarum: Secondary | ICD-10-CM | POA: Insufficient documentation

## 2013-06-17 DIAGNOSIS — O26859 Spotting complicating pregnancy, unspecified trimester: Secondary | ICD-10-CM | POA: Insufficient documentation

## 2013-06-17 DIAGNOSIS — O219 Vomiting of pregnancy, unspecified: Secondary | ICD-10-CM

## 2013-06-17 DIAGNOSIS — R109 Unspecified abdominal pain: Secondary | ICD-10-CM | POA: Insufficient documentation

## 2013-06-17 LAB — WET PREP, GENITAL
Clue Cells Wet Prep HPF POC: NONE SEEN
Trich, Wet Prep: NONE SEEN
Yeast Wet Prep HPF POC: NONE SEEN

## 2013-06-17 LAB — URINALYSIS, ROUTINE W REFLEX MICROSCOPIC
BILIRUBIN URINE: NEGATIVE
Glucose, UA: NEGATIVE mg/dL
HGB URINE DIPSTICK: NEGATIVE
Leukocytes, UA: NEGATIVE
Nitrite: NEGATIVE
PROTEIN: NEGATIVE mg/dL
Specific Gravity, Urine: >1.03 — ABNORMAL HIGH (ref 1.005–1.030)
Urobilinogen, UA: 0.2 mg/dL (ref 0.0–1.0)
pH: 5.5 (ref 5.0–8.0)

## 2013-06-17 MED ORDER — PROMETHAZINE HCL 12.5 MG PO TABS: 12.5000 mg | ORAL_TABLET | Freq: Four times a day (QID) | ORAL | Status: DC | PRN

## 2013-06-17 MED ORDER — METOCLOPRAMIDE HCL 5 MG/ML IJ SOLN
10.0000 mg | Freq: Once | INTRAMUSCULAR | Status: AC
  Administered 2013-06-17: 10 mg via INTRAVENOUS
  Filled 2013-06-17: qty 2

## 2013-06-17 MED ORDER — DEXTROSE 5 % IN LACTATED RINGERS IV BOLUS
1000.0000 mL | Freq: Once | INTRAVENOUS | Status: AC
  Administered 2013-06-17: 1000 mL via INTRAVENOUS

## 2013-06-17 NOTE — Discharge Instructions (Signed)
Hyperemesis Gravidarum  Hyperemesis gravidarum is a severe form of nausea and vomiting that happens during pregnancy. Hyperemesis is worse than morning sickness. It may cause you to have nausea or vomiting all day for many days. It may keep you from eating and drinking enough food and liquids. Hyperemesis usually occurs during the first half (the first 20 weeks) of pregnancy. It often goes away once a woman is in her second half of pregnancy. However, sometimes hyperemesis continues through an entire pregnancy.   CAUSES   The cause of this condition is not completely known but is thought to be related to changes in the body's hormones when pregnant. It could be from the high level of the pregnancy hormone or an increase in estrogen in the body.   SIGNS AND SYMPTOMS   · Severe nausea and vomiting.  · Nausea that does not go away.  · Vomiting that does not allow you to keep any food down.  · Weight loss and body fluid loss (dehydration).  · Having no desire to eat or not liking food you have previously enjoyed.  DIAGNOSIS   Your health care provider will do a physical exam and ask you about your symptoms. He or she may also order blood tests and urine tests to make sure something else is not causing the problem.   TREATMENT   You may only need medicine to control the problem. If medicines do not control the nausea and vomiting, you will be treated in the hospital to prevent dehydration, increased acid in the blood (acidosis), weight loss, and changes in the electrolytes in your body that may harm the unborn baby (fetus). You may need IV fluids.   HOME CARE INSTRUCTIONS   · Only take over-the-counter or prescription medicines as directed by your health care provider.  · Try eating a couple of dry crackers or toast in the morning before getting out of bed.  · Avoid foods and smells that upset your stomach.  · Avoid fatty and spicy foods.  · Eat 5 6 small meals a day.  · Do not drink when eating meals. Drink between  meals.  · For snacks, eat high-protein foods, such as cheese.  · Eat or suck on things that have ginger in them. Ginger helps nausea.  · Avoid food preparation. The smell of food can spoil your appetite.  · Avoid iron pills and iron in your multivitamins until after 3 4 months of being pregnant. However, consult with your health care provider before stopping any prescribed iron pills.  SEEK MEDICAL CARE IF:   · Your abdominal pain increases.  · You have a severe headache.  · You have vision problems.  · You are losing weight.  SEEK IMMEDIATE MEDICAL CARE IF:   · You are unable to keep fluids down.  · You vomit blood.  · You have constant nausea and vomiting.  · You have excessive weakness.  · You have extreme thirst.  · You have dizziness or fainting.  · You have a fever or persistent symptoms for more than 2 3 days.  · You have a fever and your symptoms suddenly get worse.  MAKE SURE YOU:   · Understand these instructions.  · Will watch your condition.  · Will get help right away if you are not doing well or get worse.  Document Released: 05/20/2005 Document Revised: 03/10/2013 Document Reviewed: 12/30/2012  Exit[employer]® Patient Information ©2014 [employer].

## 2013-06-17 NOTE — MAU Provider Note (Signed)
History     CSN: [id number]  Arrival date and time: 06/17/13 2229   First Provider Initiated Contact with Patient 06/17/13 [phone]      Chief Complaint  Patient presents with  . Morning Sickness  . Dizziness  . Fatigue   HPI Jenna Mcclure is a 33 y.o. [id number] at [redacted]w[redacted]d who presented to MAU c/o severe nausea and vomiting since 9 a.m. yesterday.  12 vomiting occurences.  Has battled nausea throughout this entire pregnancy and tried phenergan, zofran, and diclesis.  Tried diclesis - last dose last night, 2 pills - with no relief.  Seen in clinic ([facility]) yesterday and told to seek medical attention if not better.  Unable to tolerate PO fluids or solids.  Admits to weakness, abdominal cramping and intermittent vaginal spotting.  Denies contractions, leakage of fluids, fever, chills, diarrhea.  This pregnancy has been complicated by [facility] and continued vaginal bleeding/spotting.  Previous 5 pregnancies were all SVD; PTL with one pregnancy and placed on bedrest.  Past Medical History  Diagnosis Date  . Hypertension   . Bilateral ovarian cysts   . OBESITY 12/01/2008    Qualifier: Diagnosis of  By: Carlena Sax  MD, Colletta [place]    . Palpitations 02/28/2010    Qualifier: Diagnosis of  Problem Stop Reason:  By: Tye Savoy MD, Tommi Rumps    . RHINITIS, ALLERGIC 07/31/2006    Qualifier: Diagnosis of  By: Samara Snide    . Abnormal Pap smear     normal for the last 6 yrs  . Hx of gonorrhea   . GERD (gastroesophageal reflux disease)   . Heart murmur     staes has heart murmur but - echo and - EKG    Past Surgical History  Procedure Laterality Date  . Laparoscopy      Family History  Problem Relation Age of Onset  . Hypertension Mother   . Hypertension Maternal Aunt   . Diabetes Maternal Uncle   . Diabetes Maternal Grandmother   . Diabetes Paternal Grandmother     History  Substance Use Topics  . Smoking status: Never Smoker   . Smokeless tobacco: Never Used  . Alcohol Use: No     Allergies:  Allergies  Allergen Reactions  . Miconazole Nitrate Hives, Itching and Rash    Patient states she used this twice and "broke out and the symptoms got worse"    Prescriptions prior to admission  Medication Sig Dispense Refill  . acetaminophen (TYLENOL) 500 MG tablet Take 500 mg by mouth every 6 (six) hours as needed for mild pain, moderate pain or headache.       . Docosahexaenoic Acid (DHA) 200 MG CAPS Take 1 tablet by mouth daily.      . Doxylamine-Pyridoxine (DICLEGIS) 10-10 MG TBEC Take 1 tablet by mouth daily.      . ondansetron (ZOFRAN-ODT) 8 MG disintegrating tablet Take 8 mg by mouth every 8 (eight) hours as needed for nausea or vomiting.      Marland Kitchen oseltamivir (TAMIFLU) 75 MG capsule Take 1 capsule (75 mg total) by mouth 2 (two) times daily.  10 capsule  0  . Prenatal Vit-Fe Fumarate-FA (PRENATAL MULTIVITAMIN) TABS tablet Take 1 tablet by mouth daily at 12 noon.        Review of Systems  Constitutional: Negative for fever, chills and diaphoresis.  Respiratory: Negative for cough.   Cardiovascular: Negative for chest pain.  Gastrointestinal: Positive for nausea, vomiting and abdominal pain (cramping). Negative for diarrhea and constipation.  Genitourinary:       Denies vaginal bleeding, lof.  Skin: Negative for rash.   Physical Exam   Blood pressure 115/70, pulse 91, temperature 98 F (36.7 C), temperature source Oral, resp. rate 18, last menstrual period 03/08/2013.  Physical Exam  Constitutional: She is oriented to person, place, and time. She appears well-developed. No distress.  Uncomfortable, ill-appearing  HENT:  Head: Normocephalic and atraumatic.  Cardiovascular: Normal rate.   Respiratory: Effort normal.  GI: Soft. Bowel sounds are normal. She exhibits no distension and no mass. There is no tenderness. There is no rebound and no guarding.  Genitourinary: Vaginal discharge (white, copious) found.  No vaginal bleeding.  Cervix closed.   Neurological: She is alert and oriented to person, place, and time.  Skin: Skin is warm and dry. She is not diaphoretic.  Psychiatric: She has a normal mood and affect. Her behavior is normal.    MAU Course  Procedures  Results for orders placed during the hospital encounter of 06/17/13 (from the past 24 hour(s))  WET PREP, GENITAL     Status: Abnormal   Collection Time    06/17/13  9:20 AM      Result Value Range   Yeast Wet Prep HPF POC NONE SEEN  NONE SEEN   Trich, Wet Prep NONE SEEN  NONE SEEN   Clue Cells Wet Prep HPF POC NONE SEEN  NONE SEEN   WBC, Wet Prep HPF POC MANY (*) NONE SEEN  URINALYSIS, ROUTINE W REFLEX MICROSCOPIC     Status: Abnormal   Collection Time    06/17/13  9:53 AM      Result Value Range   Color, Urine YELLOW  YELLOW   APPearance HAZY (*) CLEAR   Specific Gravity, Urine >1.030 (*) 1.005 - 1.030   pH 5.5  5.0 - 8.0   Glucose, UA NEGATIVE  NEGATIVE mg/dL   Hgb urine dipstick NEGATIVE  NEGATIVE   Bilirubin Urine NEGATIVE  NEGATIVE   Ketones, ur >80 (*) NEGATIVE mg/dL   Protein, ur NEGATIVE  NEGATIVE mg/dL   Urobilinogen, UA 0.2  0.0 - 1.0 mg/dL   Nitrite NEGATIVE  NEGATIVE   Leukocytes, UA NEGATIVE  NEGATIVE    Assessment and Plan  #N/V - IV insertion; start IVFs and Reglan given in MAU; UA shows +ketones, elev SG; nausea improved, tolerated PO fluids / crackers in MAU; encourage PO fluids at home #Vaginal Spotting - cervix is closed with no bleeding noted; white discharge seen on speculum exam; wet prep obtained, + WBCs   TUCKER, BRITTON L 06/17/2013, 8:56 AM   I examined pt and agree with documentation above and PA-S plan of care. [place] with Dr. Lynnette Caffey > reviewed HPI/exam/management and current patient state > discharge to home with an additional RX for phenergan.  A: Nausea and Vomiting Affecting Pregnancy  P: Discharge to home RX Phenergan Increase fluids Follow-up if worsening or no improvement in 24  hours.  [facility]

## 2013-06-17 NOTE — MAU Note (Signed)
Pt stated she is feeling weak and dizzy. Has had constant nausea and frequent  Vomiting. C/O some spotting as well. Has been taking zofran, phenergan and dicleasis  For nausea without  releif.

## 2013-07-05 ENCOUNTER — Inpatient Hospital Stay (HOSPITAL_COMMUNITY)
Admission: AD | Admit: 2013-07-05 | Discharge: 2013-07-05 | Disposition: A | Discharge status: Home / Self Care | Payer: Managed Care, Other (non HMO) | Source: Ambulatory Visit | Attending: Obstetrics and Gynecology | Admitting: Obstetrics and Gynecology

## 2013-07-05 ENCOUNTER — Encounter (HOSPITAL_COMMUNITY): Payer: Self-pay | Admitting: *Deleted

## 2013-07-05 ENCOUNTER — Inpatient Hospital Stay (HOSPITAL_COMMUNITY): Payer: Managed Care, Other (non HMO)

## 2013-07-05 ENCOUNTER — Other Ambulatory Visit: Payer: Self-pay | Admitting: Advanced Practice Midwife

## 2013-07-05 DIAGNOSIS — Y92009 Unspecified place in unspecified non-institutional (private) residence as the place of occurrence of the external cause: Secondary | ICD-10-CM | POA: Insufficient documentation

## 2013-07-05 DIAGNOSIS — B373 Candidiasis of vulva and vagina: Secondary | ICD-10-CM

## 2013-07-05 DIAGNOSIS — K219 Gastro-esophageal reflux disease without esophagitis: Secondary | ICD-10-CM | POA: Insufficient documentation

## 2013-07-05 DIAGNOSIS — R011 Cardiac murmur, unspecified: Secondary | ICD-10-CM | POA: Insufficient documentation

## 2013-07-05 DIAGNOSIS — B3731 Acute candidiasis of vulva and vagina: Secondary | ICD-10-CM | POA: Insufficient documentation

## 2013-07-05 DIAGNOSIS — O99891 Other specified diseases and conditions complicating pregnancy: Secondary | ICD-10-CM | POA: Insufficient documentation

## 2013-07-05 DIAGNOSIS — O9A212 Injury, poisoning and certain other consequences of external causes complicating pregnancy, second trimester: Secondary | ICD-10-CM

## 2013-07-05 DIAGNOSIS — O9989 Other specified diseases and conditions complicating pregnancy, childbirth and the puerperium: Principal | ICD-10-CM

## 2013-07-05 DIAGNOSIS — W010XXA Fall on same level from slipping, tripping and stumbling without subsequent striking against object, initial encounter: Secondary | ICD-10-CM | POA: Insufficient documentation

## 2013-07-05 DIAGNOSIS — O2390 Unspecified genitourinary tract infection in pregnancy, unspecified trimester: Secondary | ICD-10-CM | POA: Insufficient documentation

## 2013-07-05 LAB — URINALYSIS, ROUTINE W REFLEX MICROSCOPIC
Bilirubin Urine: NEGATIVE
Glucose, UA: NEGATIVE mg/dL
Hgb urine dipstick: NEGATIVE
KETONES UR: NEGATIVE mg/dL
Leukocytes, UA: NEGATIVE
NITRITE: NEGATIVE
PH: 6 (ref 5.0–8.0)
Protein, ur: NEGATIVE mg/dL
UROBILINOGEN UA: 0.2 mg/dL (ref 0.0–1.0)

## 2013-07-05 MED ORDER — FLUCONAZOLE 150 MG PO TABS: ORAL_TABLET | ORAL | Status: DC

## 2013-07-05 NOTE — MAU Note (Signed)
Patient presents stating that she slipped and fell off her front porch striking her right side and wants to insure that the baby is okay.

## 2013-07-05 NOTE — MAU Note (Signed)
Patient states her son has been injured at school and she needs to leave to go pick him up. Lattie Haw informed with discharge order rec'd.

## 2013-07-05 NOTE — MAU Provider Note (Signed)
Chief Complaint: fall in pregnancy   First Provider Initiated Contact with Patient 07/05/13 1011     SUBJECTIVE HPI: Jenna Mcclure is a 33 y.o. [id number] at [redacted]w[redacted]d by LMP who presents to maternity admissions reporting she slipped on a step on her front porch this morning and hit her right side and right lower back on the step and ground.  She denies any visible injuries, bruising or abrasions.  She is feeling fetal flutters so far in this pregnancy but has not felt movement since the accident.  She also reports she was diagnosed recently in the MAU with yeast infection but the medication was not at her pharmacy as expected.  She is having vaginal itching with some white discharge.  She denies cramping/contractions, LOF, vaginal bleeding, vaginal itching/burning, urinary symptoms, h/a, dizziness, n/v, or fever/chills.     Past Medical History  Diagnosis Date  . Hypertension   . Bilateral ovarian cysts   . OBESITY 12/01/2008    Qualifier: Diagnosis of  By: Carlena Sax  MD, Colletta Maryland    . Palpitations 02/28/2010    Qualifier: Diagnosis of  Problem Stop Reason:  By: Tye Savoy MD, Tommi Rumps    . RHINITIS, ALLERGIC 07/31/2006    Qualifier: Diagnosis of  By: Samara Snide    . Abnormal Pap smear     normal for the last 6 yrs  . Hx of gonorrhea   . GERD (gastroesophageal reflux disease)   . Heart murmur     staes has heart murmur but - echo and - EKG   Past Surgical History  Procedure Laterality Date  . Laparoscopy     History   Social History  . Marital Status: Married    Spouse Name: N/A    Number of Children: N/A  . Years of Education: N/A   Occupational History  . Not on file.   Social History Main Topics  . Smoking status: Never Smoker   . Smokeless tobacco: Never Used  . Alcohol Use: No  . Drug Use: No  . Sexual Activity: Yes    Birth Control/ Protection: None   Other Topics Concern  . Not on file   Social History Narrative  . No narrative on file   No current facility-administered  medications on file prior to encounter.   Current Outpatient Prescriptions on File Prior to Encounter  Medication Sig Dispense Refill  . acetaminophen (TYLENOL) 500 MG tablet Take 1,000 mg by mouth every 6 (six) hours as needed for mild pain, moderate pain or headache.       . Docosahexaenoic Acid (DHA) 200 MG CAPS Take 1 tablet by mouth daily.      . Doxylamine-Pyridoxine (DICLEGIS) 10-10 MG TBEC Take 2 tablets by mouth daily as needed (nausea).       . Prenatal Vit-Fe Fumarate-FA (PRENATAL MULTIVITAMIN) TABS tablet Take 1 tablet by mouth daily at 12 noon.      . promethazine (PHENERGAN) 12.5 MG tablet Take 1 tablet (12.5 mg total) by mouth every 6 (six) hours as needed for nausea.  30 tablet  0  . promethazine (PHENERGAN) 25 MG tablet Take 1 tablet (25 mg total) by mouth every 6 (six) hours as needed for nausea.  30 tablet  0  . [DISCONTINUED] DULoxetine (CYMBALTA) 20 MG capsule Take 20 mg by mouth daily.      . [DISCONTINUED] Norgestim-Eth Radene Journey Triphasic (ORTHO TRI-CYCLEN LO PO) Take 1 tablet by mouth daily.       Allergies  Allergen Reactions  .  Miconazole Nitrate Hives, Itching and Rash    Patient states she used this twice and "broke out and the symptoms got worse"    ROS: Pertinent items in HPI  OBJECTIVE Blood pressure 123/76, pulse 88, temperature 98.3 F (36.8 C), temperature source Oral, resp. rate 18, height 5\' 2"  (1.575 m), weight 154 lb (69.854 kg), last menstrual period 03/08/2013. GENERAL: Well-developed, well-nourished female in no acute distress.  HEENT: Normocephalic HEART: normal rate RESP: normal effort ABDOMEN: Soft, non-tender BACK: Mild tenderness to palpation in right lower back, no visible abrasions or bruising EXTREMITIES: Nontender, no edema NEURO: Alert and oriented  FHT 147 by doppler  LAB RESULTS Results for orders placed during the hospital encounter of 07/05/13 (from the past 24 hour(s))  URINALYSIS, ROUTINE W REFLEX MICROSCOPIC     Status:  Abnormal   Collection Time    07/05/13  9:47 AM      Result Value Range   Color, Urine YELLOW  YELLOW   APPearance CLEAR  CLEAR   Specific Gravity, Urine >1.030 (*) 1.005 - 1.030   pH 6.0  5.0 - 8.0   Glucose, UA NEGATIVE  NEGATIVE mg/dL   Hgb urine dipstick NEGATIVE  NEGATIVE   Bilirubin Urine NEGATIVE  NEGATIVE   Ketones, ur NEGATIVE  NEGATIVE mg/dL   Protein, ur NEGATIVE  NEGATIVE mg/dL   Urobilinogen, UA 0.2  0.0 - 1.0 mg/dL   Nitrite NEGATIVE  NEGATIVE   Leukocytes, UA NEGATIVE  NEGATIVE    IMAGING   ASSESSMENT 1. Vaginal candidiasis   2. Traumatic injury during pregnancy in second trimester   3. Fall from slipping on wet surface     PLAN Pt had to leave MAU suddenly for family emergency Consult Dr Arelia SneddonMcComb Discharge home Called pt to report normal U/S findings, recommendation for Tylenol for pain Diflucan 150 mg x2 doses sent to pt pharmacy.  Pt reports skin reaction to topical azoles but has taken Diflucan before without difficulty. F/U with office if pain persists Return to MAU as needed    Medication List    ASK your doctor about these medications       acetaminophen 500 MG tablet  Commonly known as:  TYLENOL  Take 1,000 mg by mouth every 6 (six) hours as needed for mild pain, moderate pain or headache.     calcium carbonate 500 MG chewable tablet  Commonly known as:  TUMS - dosed in mg elemental calcium  Chew 1 tablet by mouth daily as needed for indigestion or heartburn.     DHA 200 MG Caps  Take 1 tablet by mouth daily.     DICLEGIS 10-10 MG Tbec  Generic drug:  Doxylamine-Pyridoxine  Take 2 tablets by mouth daily as needed (nausea).     prenatal multivitamin Tabs tablet  Take 1 tablet by mouth daily at 12 noon.     promethazine 12.5 MG tablet  Commonly known as:  PHENERGAN  Take 1 tablet (12.5 mg total) by mouth every 6 (six) hours as needed for nausea.     promethazine 25 MG tablet  Commonly known as:  PHENERGAN  Take 1 tablet (25 mg total)  by mouth every 6 (six) hours as needed for nausea.       Follow-up Information   Follow up with Meriel PicaHOLLAND,RICHARD M, MD.   Specialty:  Obstetrics and Gynecology   Contact information:   [address] [place] [id number] [phone]       Sharen CounterLisa Leftwich-Kirby Certified Nurse-Midwife  07/05/2013  6:36 PM

## 2013-07-05 NOTE — Progress Notes (Signed)
Pt left MAU today quickly related to family emergency and Diflucan not prescribed as promised by provider.  Diflucan 150 mg x2 doses sent to pt pharmacy.  Pt has allergy to other azole topical medications but has taken Diflucan before without difficulty.

## 2013-07-10 ENCOUNTER — Telehealth: Payer: Self-pay | Admitting: Family Medicine

## 2013-07-10 NOTE — Telephone Encounter (Signed)
Entered in error

## 2013-07-13 ENCOUNTER — Telehealth: Payer: Self-pay | Admitting: Family Medicine

## 2013-07-13 ENCOUNTER — Encounter: Payer: Self-pay | Admitting: Family Medicine

## 2013-07-13 ENCOUNTER — Ambulatory Visit (INDEPENDENT_AMBULATORY_CARE_PROVIDER_SITE_OTHER): Payer: Managed Care, Other (non HMO) | Admitting: Family Medicine

## 2013-07-13 VITALS — BP 133/93 | HR 94 | Temp 98.3°F | Resp 16 | Wt 160.0 lb

## 2013-07-13 DIAGNOSIS — H109 Unspecified conjunctivitis: Secondary | ICD-10-CM | POA: Insufficient documentation

## 2013-07-13 MED ORDER — AZITHROMYCIN 1 % OP SOLN: OPHTHALMIC | Status: DC

## 2013-07-13 NOTE — Telephone Encounter (Signed)
Prescription given today for pink eye is $50. Can there be something called in that is cheaper? Pt is pregnant Please advise Call was given to [place].  Pt got upset because I could not tell her when she would be called. " I am pregant , I have children and my eye is itching and you dont care"

## 2013-07-13 NOTE — Assessment & Plan Note (Signed)
Right eye. Ophthalmic antibiotic prescribed. Good hand hygiene recommended to prevent spread. F/U soon if symptom worsens. Return precaution discussed with patient including blurry vision or worsening redness of her right eye or spread to the left. She verbalized understanding.

## 2013-07-13 NOTE — Patient Instructions (Signed)

## 2013-07-13 NOTE — Telephone Encounter (Signed)
Spoke with Jenna Mcclure and she is aware that I will be sending message to Dr. Gwendlyn Deutscher who saw her in clinic today.  She states that she is just concerned about spreading it and wants to start treatment quickly.  Please advise.  Jenna Mcclure would like to know if something cheaper is called in since it is close to the time the office closes. Jenna Mcclure,CMA

## 2013-07-13 NOTE — Progress Notes (Signed)
Subjective:     Patient ID: Jenna Mcclure, female   DOB: Dec 05, 1980, 33 y.o.   MRN: [id number]  Conjunctivitis  The current episode started 2 days ago (Right eye redness and swelling). The onset was sudden. The problem has been gradually worsening. The problem is moderate. Relieved by: Cold compression. Associated symptoms include eye itching, congestion, eye discharge and eye redness. Pertinent negatives include no orthopnea, no fever, no decreased vision, no double vision, no photophobia, no abdominal pain, no constipation, no diarrhea, no nausea, no ear discharge, no ear pain, no headaches, no hearing loss, no mouth sores, no sore throat, no swollen glands, no cough, no rash and no eye pain. Associated symptoms comments: Irritation in her right eye with  gritty feeling in her eye.. The right eye is affected.The eyelid exhibits swelling and redness. There were no sick contacts.   Current Outpatient Prescriptions on File Prior to Visit  Medication Sig Dispense Refill  . acetaminophen (TYLENOL) 500 MG tablet Take 1,000 mg by mouth every 6 (six) hours as needed for mild pain, moderate pain or headache.       . calcium carbonate (TUMS - DOSED IN MG ELEMENTAL CALCIUM) 500 MG chewable tablet Chew 1 tablet by mouth daily as needed for indigestion or heartburn.      . Docosahexaenoic Acid (DHA) 200 MG CAPS Take 1 tablet by mouth daily.      . Doxylamine-Pyridoxine (DICLEGIS) 10-10 MG TBEC Take 2 tablets by mouth daily as needed (nausea).       . fluconazole (DIFLUCAN) 150 MG tablet Take 1 tablet now, and the second tablet in 48 hours.  2 tablet  0  . Prenatal Vit-Fe Fumarate-FA (PRENATAL MULTIVITAMIN) TABS tablet Take 1 tablet by mouth daily at 12 noon.      . promethazine (PHENERGAN) 12.5 MG tablet Take 1 tablet (12.5 mg total) by mouth every 6 (six) hours as needed for nausea.  30 tablet  0  . promethazine (PHENERGAN) 25 MG tablet Take 1 tablet (25 mg total) by mouth every 6 (six) hours as needed for  nausea.  30 tablet  0  . [DISCONTINUED] DULoxetine (CYMBALTA) 20 MG capsule Take 20 mg by mouth daily.      . [DISCONTINUED] Norgestim-Eth Radene Journey Triphasic (ORTHO TRI-CYCLEN LO PO) Take 1 tablet by mouth daily.       No current facility-administered medications on file prior to visit.   Past Medical History  Diagnosis Date  . Hypertension   . Bilateral ovarian cysts   . OBESITY 12/01/2008    Qualifier: Diagnosis of  By: Carlena Sax  MD, Colletta Maryland    . Palpitations 02/28/2010    Qualifier: Diagnosis of  Problem Stop Reason:  By: Tye Savoy MD, Tommi Rumps    . RHINITIS, ALLERGIC 07/31/2006    Qualifier: Diagnosis of  By: Samara Snide    . Abnormal Pap smear     normal for the last 6 yrs  . Hx of gonorrhea   . GERD (gastroesophageal reflux disease)   . Heart murmur     staes has heart murmur but - echo and - EKG     Review of Systems  Constitutional: Negative for fever.  HENT: Positive for congestion. Negative for ear discharge, ear pain, hearing loss, mouth sores and sore throat.   Eyes: Positive for discharge, redness and itching. Negative for double vision, photophobia and pain.  Respiratory: Negative for cough.   Cardiovascular: Negative for orthopnea.  Gastrointestinal: Negative for nausea, abdominal pain, diarrhea and constipation.  Skin: Negative for rash.  Neurological: Negative for headaches.   Filed Vitals:   07/13/13 1012  BP: 133/93  Pulse: 94  Temp: 98.3 F (36.8 C)  TempSrc: Oral  Resp: 16  Weight: 160 lb (72.576 kg)  SpO2: 98%       Objective:   Physical Exam  Nursing note and vitals reviewed. Constitutional: She appears well-developed. No distress.  Eyes: EOM are normal. Pupils are equal, round, and reactive to light. Right eye exhibits discharge. Right eye exhibits no chemosis and no exudate. Left eye exhibits no chemosis, no discharge and no exudate. Right conjunctiva is injected. Right conjunctiva has no hemorrhage. Left conjunctiva is not injected. Left conjunctiva  has no hemorrhage.  Right eye lid swollen with no tenderness.  Cardiovascular: Normal rate, regular rhythm, normal heart sounds and intact distal pulses.   No murmur heard. Pulmonary/Chest: Effort normal and breath sounds normal. No respiratory distress. She has no wheezes.       Assessment:     Conjunctivitis: Looks more of bacterial than viral      Plan:     Check problem list.

## 2013-07-14 ENCOUNTER — Other Ambulatory Visit: Payer: Self-pay | Admitting: Family Medicine

## 2013-07-14 MED ORDER — ERYTHROMYCIN 5 MG/GM OP OINT: TOPICAL_OINTMENT | OPHTHALMIC | Status: DC

## 2013-07-14 NOTE — Telephone Encounter (Signed)
Pt is aware.  Jenna Mcclure,CMA  

## 2013-07-14 NOTE — Telephone Encounter (Signed)
Medication changed to erythromycin ointment. I am trying to avoid other cheaper medication because she is pregnant.

## 2013-09-12 ENCOUNTER — Inpatient Hospital Stay (HOSPITAL_COMMUNITY)
Admission: AD | Admit: 2013-09-12 | Discharge: 2013-09-12 | Disposition: A | Discharge status: Home / Self Care | Payer: Managed Care, Other (non HMO) | Source: Ambulatory Visit | Attending: Obstetrics and Gynecology | Admitting: Obstetrics and Gynecology

## 2013-09-12 ENCOUNTER — Encounter (HOSPITAL_COMMUNITY): Payer: Self-pay | Admitting: *Deleted

## 2013-09-12 DIAGNOSIS — O99891 Other specified diseases and conditions complicating pregnancy: Secondary | ICD-10-CM | POA: Insufficient documentation

## 2013-09-12 DIAGNOSIS — O26893 Other specified pregnancy related conditions, third trimester: Secondary | ICD-10-CM

## 2013-09-12 DIAGNOSIS — O9989 Other specified diseases and conditions complicating pregnancy, childbirth and the puerperium: Secondary | ICD-10-CM

## 2013-09-12 DIAGNOSIS — O10019 Pre-existing essential hypertension complicating pregnancy, unspecified trimester: Secondary | ICD-10-CM | POA: Insufficient documentation

## 2013-09-12 DIAGNOSIS — K219 Gastro-esophageal reflux disease without esophagitis: Secondary | ICD-10-CM | POA: Insufficient documentation

## 2013-09-12 DIAGNOSIS — R12 Heartburn: Secondary | ICD-10-CM | POA: Insufficient documentation

## 2013-09-12 LAB — COMPREHENSIVE METABOLIC PANEL
ALT: 7 U/L (ref 0–35)
AST: 12 U/L (ref 0–37)
Albumin: 2.7 g/dL — ABNORMAL LOW (ref 3.5–5.2)
Alkaline Phosphatase: 58 U/L (ref 39–117)
BUN: 7 mg/dL (ref 6–23)
CALCIUM: 8.5 mg/dL (ref 8.4–10.5)
CO2: 24 meq/L (ref 19–32)
Chloride: 101 mEq/L (ref 96–112)
Creatinine, Ser: 0.6 mg/dL (ref 0.50–1.10)
GFR calc Af Amer: >90 mL/min (ref >90)
GFR calc non Af Amer: >90 mL/min (ref >90)
Glucose, Bld: 88 mg/dL (ref 70–99)
POTASSIUM: 4.2 meq/L (ref 3.7–5.3)
SODIUM: 135 meq/L — AB (ref 137–147)
TOTAL PROTEIN: 6.2 g/dL (ref 6.0–8.3)
Total Bilirubin: <0.2 mg/dL — ABNORMAL LOW (ref 0.3–1.2)

## 2013-09-12 LAB — URINALYSIS, ROUTINE W REFLEX MICROSCOPIC
Bilirubin Urine: NEGATIVE
Glucose, UA: NEGATIVE mg/dL
Hgb urine dipstick: NEGATIVE
Ketones, ur: NEGATIVE mg/dL
Nitrite: NEGATIVE
PH: 8.5 — AB (ref 5.0–8.0)
Protein, ur: NEGATIVE mg/dL
Specific Gravity, Urine: 1.02 (ref 1.005–1.030)
Urobilinogen, UA: 0.2 mg/dL (ref 0.0–1.0)

## 2013-09-12 LAB — URINE MICROSCOPIC-ADD ON

## 2013-09-12 LAB — CBC
HCT: 36.9 % (ref 36.0–46.0)
HEMOGLOBIN: 12.6 g/dL (ref 12.0–15.0)
MCH: 31.1 pg (ref 26.0–34.0)
MCHC: 34.1 g/dL (ref 30.0–36.0)
MCV: 91.1 fL (ref 78.0–100.0)
Platelets: 189 10*3/uL (ref 150–400)
RBC: 4.05 MIL/uL (ref 3.87–5.11)
RDW: 14.5 % (ref 11.5–15.5)
WBC: 10.3 10*3/uL (ref 4.0–10.5)

## 2013-09-12 LAB — LIPASE, BLOOD: Lipase: 19 U/L (ref 11–59)

## 2013-09-12 LAB — AMYLASE: Amylase: 92 U/L (ref 0–105)

## 2013-09-12 MED ORDER — GI COCKTAIL ~~LOC~~
30.0000 mL | Freq: Once | ORAL | Status: AC
  Administered 2013-09-12: 30 mL via ORAL
  Filled 2013-09-12: qty 30

## 2013-09-12 MED ORDER — RANITIDINE HCL 150 MG PO TABS: 150.0000 mg | ORAL_TABLET | Freq: Two times a day (BID) | ORAL | Status: DC

## 2013-09-12 NOTE — MAU Note (Signed)
Pt presents with complaints of acid reflux. She has tried several over the counter medications but nothing is helping. Hurts in her chest and her upper back at times.

## 2013-09-12 NOTE — MAU Provider Note (Signed)
History     CSN: [id number]  Arrival date and time: 09/12/13 0808   First Provider Initiated Contact with Patient 09/12/13 [phone]      Chief Complaint  Patient presents with  . Gastrophageal Reflux   HPI 33 y.o. [id number] at [redacted]w[redacted]d with "bad acid reflux" since Friday night, worsening yesterday evening around 5 PM. Complains of "sour stomach" and nausea, pain that radiates from mid chest through to back. Not improving with mylanta, hasn't been able to eat much in the last 24 hours. History of acid reflux, states she had a prior episode like this that was relieved with GI cocktail. Denies RUQ pain, headache, vomiting.   Past Medical History  Diagnosis Date  . Hypertension   . Bilateral ovarian cysts   . OBESITY 12/01/2008    Qualifier: Diagnosis of  By: Carlena Sax  MD, Colletta Maryland    . Palpitations 02/28/2010    Qualifier: Diagnosis of  Problem Stop Reason:  By: Tye Savoy MD, Tommi Rumps    . RHINITIS, ALLERGIC 07/31/2006    Qualifier: Diagnosis of  By: Samara Snide    . Abnormal Pap smear     normal for the last 6 yrs  . Hx of gonorrhea   . GERD (gastroesophageal reflux disease)   . Heart murmur     staes has heart murmur but - echo and - EKG    Past Surgical History  Procedure Laterality Date  . Laparoscopy      Family History  Problem Relation Age of Onset  . Hypertension Mother   . Hypertension Maternal Aunt   . Diabetes Maternal Uncle   . Diabetes Maternal Grandmother   . Diabetes Paternal Grandmother     History  Substance Use Topics  . Smoking status: Never Smoker   . Smokeless tobacco: Never Used  . Alcohol Use: No    Allergies:  Allergies  Allergen Reactions  . Miconazole Nitrate Hives, Itching and Rash    Patient states she used this twice and "broke out and the symptoms got worse"    No prescriptions prior to admission    Review of Systems  Constitutional: Negative.   Respiratory: Negative.   Cardiovascular: Negative.   Gastrointestinal: Positive for nausea and  abdominal pain. Negative for vomiting, diarrhea and constipation.  Genitourinary: Negative for dysuria, urgency, frequency, hematuria and flank pain.       Negative for vaginal bleeding, cramping/contractions  Musculoskeletal: Negative.   Neurological: Negative.   Psychiatric/Behavioral: Negative.    Physical Exam   Blood pressure 125/69, pulse 85, temperature 98.6 F (37 C), temperature source Oral, resp. rate 18, height 5\' 2"  (1.575 m), weight 176 lb (79.833 kg), last menstrual period 03/08/2013.  Physical Exam  Nursing note and vitals reviewed. Constitutional: She is oriented to person, place, and time. She appears well-developed and well-nourished. No distress.  Cardiovascular: Normal rate.   Respiratory: Effort normal.  GI: Soft. She exhibits distension (gravid, c/w dates). She exhibits no mass. There is no tenderness. There is no rebound and no guarding.  Musculoskeletal: Normal range of motion.  Neurological: She is alert and oriented to person, place, and time.  Skin: Skin is warm and dry.  Psychiatric: She has a normal mood and affect.    MAU Course  Procedures  Results for orders placed during the hospital encounter of 09/12/13 (from the past 24 hour(s))  CBC     Status: None   Collection Time    09/12/13  8:50 AM      Result Value  Ref Range   WBC 10.3  4.0 - 10.5 K/uL   RBC 4.05  3.87 - 5.11 MIL/uL   Hemoglobin 12.6  12.0 - 15.0 g/dL   HCT 36.9  36.0 - 46.0 %   MCV 91.1  78.0 - 100.0 fL   MCH 31.1  26.0 - 34.0 pg   MCHC 34.1  30.0 - 36.0 g/dL   RDW 14.5  11.5 - 15.5 %   Platelets 189  150 - 400 K/uL  COMPREHENSIVE METABOLIC PANEL     Status: Abnormal   Collection Time    09/12/13  8:50 AM      Result Value Ref Range   Sodium 135 (*) 137 - 147 mEq/L   Potassium 4.2  3.7 - 5.3 mEq/L   Chloride 101  96 - 112 mEq/L   CO2 24  19 - 32 mEq/L   Glucose, Bld 88  70 - 99 mg/dL   BUN 7  6 - 23 mg/dL   Creatinine, Ser 0.60  0.50 - 1.10 mg/dL   Calcium 8.5  8.4 -  10.5 mg/dL   Total Protein 6.2  6.0 - 8.3 g/dL   Albumin 2.7 (*) 3.5 - 5.2 g/dL   AST 12  0 - 37 U/L   ALT 7  0 - 35 U/L   Alkaline Phosphatase 58  39 - 117 U/L   Total Bilirubin <0.2 (*) 0.3 - 1.2 mg/dL   GFR calc non Af Amer >90  >90 mL/min   GFR calc Af Amer >90  >90 mL/min  AMYLASE     Status: None   Collection Time    09/12/13  8:50 AM      Result Value Ref Range   Amylase 92  0 - 105 U/L  LIPASE, BLOOD     Status: None   Collection Time    09/12/13  8:50 AM      Result Value Ref Range   Lipase 19  11 - 59 U/L  URINALYSIS, ROUTINE W REFLEX MICROSCOPIC     Status: Abnormal   Collection Time    09/12/13  9:08 AM      Result Value Ref Range   Color, Urine YELLOW  YELLOW   APPearance CLEAR  CLEAR   Specific Gravity, Urine 1.020  1.005 - 1.030   pH 8.5 (*) 5.0 - 8.0   Glucose, UA NEGATIVE  NEGATIVE mg/dL   Hgb urine dipstick NEGATIVE  NEGATIVE   Bilirubin Urine NEGATIVE  NEGATIVE   Ketones, ur NEGATIVE  NEGATIVE mg/dL   Protein, ur NEGATIVE  NEGATIVE mg/dL   Urobilinogen, UA 0.2  0.0 - 1.0 mg/dL   Nitrite NEGATIVE  NEGATIVE   Leukocytes, UA TRACE (*) NEGATIVE  URINE MICROSCOPIC-ADD ON     Status: Abnormal   Collection Time    09/12/13  9:08 AM      Result Value Ref Range   Squamous Epithelial / LPF MANY (*) RARE   WBC, UA 0-2  <3 WBC/hpf   Bacteria, UA FEW (*) RARE   Urine-Other MUCOUS PRESENT     Pain resolved with GI cocktail  Assessment and Plan   1. Heartburn in pregnancy in third trimester   Rx ranitidine 150 mg 1 po bid  F/U as scheduled    Medication List    STOP taking these medications       promethazine 12.5 MG tablet  Commonly known as:  PHENERGAN     promethazine 25 MG tablet  Commonly known as:  PHENERGAN      TAKE these medications       acetaminophen 500 MG tablet  Commonly known as:  TYLENOL  Take 1,000 mg by mouth every 6 (six) hours as needed for mild pain, moderate pain or headache.     calcium carbonate 500 MG chewable tablet   Commonly known as:  TUMS - dosed in mg elemental calcium  Chew 1 tablet by mouth daily as needed for indigestion or heartburn.     DHA 200 MG Caps  Take 1 tablet by mouth daily.     DICLEGIS 10-10 MG Tbec  Generic drug:  Doxylamine-Pyridoxine  Take 2 tablets by mouth daily as needed (nausea).     erythromycin ophthalmic ointment  Apply to right eye daily preferably at night.     fluconazole 150 MG tablet  Commonly known as:  DIFLUCAN  Take 1 tablet now, and the second tablet in 48 hours.     prenatal multivitamin Tabs tablet  Take 1 tablet by mouth daily at 12 noon.     ranitidine 150 MG tablet  Commonly known as:  ZANTAC  Take 1 tablet (150 mg total) by mouth 2 (two) times daily.            Follow-up Information   Follow up with Margarette Asal, MD. (as scheduled or sooner as needed)    Specialty:  Obstetrics and Gynecology   Contact information:   [place] [id number] [place] 09/12/2013, 1:29 PM

## 2013-09-21 ENCOUNTER — Telehealth: Payer: Self-pay | Admitting: Family Medicine

## 2013-09-21 ENCOUNTER — Encounter: Payer: Self-pay | Admitting: *Deleted

## 2013-09-21 NOTE — Telephone Encounter (Signed)
LM for pt that "letter you requested is up front for pick up." Jazmin Hartsell,CMA

## 2013-09-21 NOTE — Telephone Encounter (Signed)
Pt called and needs a letter re-written because the dates that were put on the 10/16/12 letter should have been 2012-2013. She needs this right away. Please call when ready. jw

## 2013-10-17 ENCOUNTER — Inpatient Hospital Stay (HOSPITAL_COMMUNITY)
Admission: AD | Admit: 2013-10-17 | Discharge: 2013-10-17 | Disposition: A | Discharge status: Home / Self Care | Payer: Managed Care, Other (non HMO) | Source: Ambulatory Visit | Attending: Obstetrics and Gynecology | Admitting: Obstetrics and Gynecology

## 2013-10-17 ENCOUNTER — Encounter (HOSPITAL_COMMUNITY): Payer: Self-pay | Admitting: *Deleted

## 2013-10-17 DIAGNOSIS — B373 Candidiasis of vulva and vagina: Secondary | ICD-10-CM

## 2013-10-17 DIAGNOSIS — K219 Gastro-esophageal reflux disease without esophagitis: Secondary | ICD-10-CM | POA: Insufficient documentation

## 2013-10-17 DIAGNOSIS — I10 Essential (primary) hypertension: Secondary | ICD-10-CM | POA: Insufficient documentation

## 2013-10-17 DIAGNOSIS — L293 Anogenital pruritus, unspecified: Secondary | ICD-10-CM | POA: Insufficient documentation

## 2013-10-17 DIAGNOSIS — R002 Palpitations: Secondary | ICD-10-CM | POA: Insufficient documentation

## 2013-10-17 DIAGNOSIS — B3731 Acute candidiasis of vulva and vagina: Secondary | ICD-10-CM | POA: Insufficient documentation

## 2013-10-17 LAB — WET PREP, GENITAL
Clue Cells Wet Prep HPF POC: NONE SEEN
Trich, Wet Prep: NONE SEEN
YEAST WET PREP: NONE SEEN

## 2013-10-17 MED ORDER — FLUCONAZOLE 150 MG PO TABS: ORAL_TABLET | ORAL | Status: DC

## 2013-10-17 MED ORDER — LIDOCAINE HCL 2 % EX GEL
CUTANEOUS | Status: AC
  Administered 2013-10-17: 5 via TOPICAL
  Filled 2013-10-17: qty 5

## 2013-10-17 NOTE — MAU Provider Note (Signed)
Chief Complaint:  Vaginitis   First Provider Initiated Contact with Patient 10/17/13 0225      HPI: Jenna Mcclure is a 33 y.o. [id number] at 22w6dwho presents to maternity admissions reporting labial itching x2 days, worsening tonight so she was unable to sleep.  The itching is in the outside of her labia on both sides, no itching inside the vagina.  She reports shaving 4-5 days ago but itching did not start until yesterday.  She put Kenalog cream from her sister on the area but it has not helped.  She reports good fetal movement, denies cramping/contractions, LOF, vaginal bleeding, urinary symptoms, h/a, dizziness, n/v, or fever/chills.    Past Medical History: Past Medical History  Diagnosis Date  . Hypertension   . Bilateral ovarian cysts   . OBESITY 12/01/2008    Qualifier: Diagnosis of  By: Carlena Sax  MD, Colletta Maryland    . Palpitations 02/28/2010    Qualifier: Diagnosis of  Problem Stop Reason:  By: Tye Savoy MD, Tommi Rumps    . RHINITIS, ALLERGIC 07/31/2006    Qualifier: Diagnosis of  By: Samara Snide    . Abnormal Pap smear     normal for the last 6 yrs  . Hx of gonorrhea   . GERD (gastroesophageal reflux disease)   . Heart murmur     staes has heart murmur but - echo and - EKG    Past obstetric history: OB History  Gravida Para Term Preterm AB SAB TAB Ectopic Multiple Living  6 5 5  0 0 0 0 0 0 5    # Outcome Date GA Lbr Len/2nd Weight Sex Delivery Anes PTL Lv  6 CUR           5 TRM 02/28/12 [redacted]w[redacted]d 134:02 / 00:41 3.71 kg (8 lb 2.9 oz) M SVD None  Y  4 TRM 2011 [redacted]w[redacted]d 11:00 3.884 kg (8 lb 9 oz) M SVD EPI  Y  3 TRM 2004 [redacted]w[redacted]d 14:00 3.544 kg (7 lb 13 oz) M SVD EPI  Y  2 TRM 1999 [redacted]w[redacted]d 02:00 3.345 kg (7 lb 6 oz) F SVD EPI  Y  1 TRM 1997 [redacted]w[redacted]d 15:00 3.402 kg (7 lb 8 oz) F SVD EPI  Y      Past Surgical History: Past Surgical History  Procedure Laterality Date  . Laparoscopy      Family History: Family History  Problem Relation Age of Onset  . Hypertension Mother   . Hypertension Maternal  Aunt   . Diabetes Maternal Uncle   . Diabetes Maternal Grandmother   . Diabetes Paternal Grandmother     Social History: History  Substance Use Topics  . Smoking status: Never Smoker   . Smokeless tobacco: Never Used  . Alcohol Use: No    Allergies:  Allergies  Allergen Reactions  . Miconazole Nitrate Hives, Itching and Rash    Patient states she used this twice and "broke out and the symptoms got worse"    Meds:  No prescriptions prior to admission    ROS: Pertinent findings in history of present illness.  Physical Exam  Blood pressure 120/70, pulse 85, temperature 98.1 F (36.7 C), resp. rate 18, height 5\' 2"  (1.575 m), weight 85.367 kg (188 lb 3.2 oz), last menstrual period 03/08/2013. GENERAL: Well-developed, well-nourished female in no acute distress.  HEENT: normocephalic HEART: normal rate RESP: normal effort ABDOMEN: Soft, non-tender, gravid appropriate for gestational age EXTREMITIES: Nontender, no edema NEURO: alert and oriented Pelvic exam: Cervix pink, visually closed, without  lesion, scant white creamy discharge, vaginal walls normal.  Mild erythema of bilateral external labia noted      FHT:  Baseline 135 , moderate variability, accelerations present, no decelerations Contractions: None on toco or to palpation   Labs: Results for orders placed during the hospital encounter of 10/17/13 (from the past 24 hour(s))  WET PREP, GENITAL     Status: Abnormal   Collection Time    10/17/13  2:30 AM      Result Value Ref Range   Yeast Wet Prep HPF POC NONE SEEN  NONE SEEN   Trich, Wet Prep NONE SEEN  NONE SEEN   Clue Cells Wet Prep HPF POC NONE SEEN  NONE SEEN   WBC, Wet Prep HPF POC FEW (*) NONE SEEN     Assessment: 1. Vaginal candidiasis     Plan: Topical lidocaine in MAU Consult Dr Helane Rima Discharge home Hydrocortisone OTC cream Diflucan 150 mg x 2 doses in 48 hours      Follow-up Information   Follow up with [facility]  [place], [place].. (As scheduled)    Contact information:   [place] [id number] [phone]       Medication List         acetaminophen 500 MG tablet  Commonly known as:  TYLENOL  Take 1,000 mg by mouth every 6 (six) hours as needed for mild pain, moderate pain or headache.     calcium carbonate 500 MG chewable tablet  Commonly known as:  TUMS - dosed in mg elemental calcium  Chew 1 tablet by mouth daily as needed for indigestion or heartburn.     DHA 200 MG Caps  Take 1 tablet by mouth daily.     DICLEGIS 10-10 MG Tbec  Generic drug:  Doxylamine-Pyridoxine  Take 2 tablets by mouth daily as needed (nausea).     erythromycin ophthalmic ointment  Apply to right eye daily preferably at night.     fluconazole 150 MG tablet  Commonly known as:  DIFLUCAN  Take 1 tablet now, and the second tablet in 48 hours.     fluconazole 150 MG tablet  Commonly known as:  DIFLUCAN  Take one tablet now, and one tablet in 2 days.     prenatal multivitamin Tabs tablet  Take 1 tablet by mouth daily at 12 noon.     ranitidine 150 MG tablet  Commonly known as:  ZANTAC  Take 1 tablet (150 mg total) by mouth 2 (two) times daily.     triamcinolone cream 0.1 %  Commonly known as:  KENALOG  Apply 1 application topically once.        Fatima Blank Certified Nurse-Midwife 10/17/2013 4:58 AM

## 2013-10-17 NOTE — Discharge Instructions (Signed)
Monilial Vaginitis  Vaginitis in a soreness, swelling and redness (inflammation) of the vagina and vulva. Monilial vaginitis is not a sexually transmitted infection.  CAUSES   Yeast vaginitis is caused by yeast (candida) that is normally found in your vagina. With a yeast infection, the candida has overgrown in number to a point that upsets the chemical balance.  SYMPTOMS   · White, thick vaginal discharge.  · Swelling, itching, redness and irritation of the vagina and possibly the lips of the vagina (vulva).  · Burning or painful urination.  · Painful intercourse.  DIAGNOSIS   Things that may contribute to monilial vaginitis are:  · Postmenopausal and virginal states.  · Pregnancy.  · Infections.  · Being tired, sick or stressed, especially if you had monilial vaginitis in the past.  · Diabetes. Good control will help lower the chance.  · Birth control pills.  · Tight fitting garments.  · Using bubble bath, feminine sprays, douches or deodorant tampons.  · Taking certain medications that kill germs (antibiotics).  · Sporadic recurrence can occur if you become ill.  TREATMENT   Your caregiver will give you medication.  · There are several kinds of anti monilial vaginal creams and suppositories specific for monilial vaginitis. For recurrent yeast infections, use a suppository or cream in the vagina 2 times a week, or as directed.  · Anti-monilial or steroid cream for the itching or irritation of the vulva may also be used. Get your caregiver's permission.  · Painting the vagina with methylene blue solution may help if the monilial cream does not work.  · Eating yogurt may help prevent monilial vaginitis.  HOME CARE INSTRUCTIONS   · Finish all medication as prescribed.  · Do not have sex until treatment is completed or after your caregiver tells you it is okay.  · Take warm sitz baths.  · Do not douche.  · Do not use tampons, especially scented ones.  · Wear cotton underwear.  · Avoid tight pants and panty  hose.  · Tell your sexual partner that you have a yeast infection. They should go to their caregiver if they have symptoms such as mild rash or itching.  · Your sexual partner should be treated as well if your infection is difficult to eliminate.  · Practice safer sex. Use condoms.  · Some vaginal medications cause latex condoms to fail. Vaginal medications that harm condoms are:  · Cleocin cream.  · Butoconazole (Femstat®).  · Terconazole (Terazol®) vaginal suppository.  · Miconazole (Monistat®) (may be purchased over the counter).  SEEK MEDICAL CARE IF:   · You have a temperature by mouth above 102° F (38.9° C).  · The infection is getting worse after 2 days of treatment.  · The infection is not getting better after 3 days of treatment.  · You develop blisters in or around your vagina.  · You develop vaginal bleeding, and it is not your menstrual period.  · You have pain when you urinate.  · You develop intestinal problems.  · You have pain with sexual intercourse.  Document Released: 02/27/2005 Document Revised: 08/12/2011 Document Reviewed: 11/11/2008  [employer]® Patient Information ©2014 [employer].

## 2013-10-17 NOTE — Progress Notes (Signed)
Lisa Leftwich-Kirby CNM in to discuss test results and d/c plan. Written and verbal d/c instructions given and understanding voiced. 

## 2013-10-17 NOTE — MAU Note (Signed)
Vaginal itching for last couple days. Worse tonight. Unable to sleep. No vag d/c. Shaved few days ago and thinks that may be causing the itching

## 2013-10-18 LAB — GC/CHLAMYDIA PROBE AMP
CT Probe RNA: NEGATIVE
GC Probe RNA: NEGATIVE

## 2013-11-15 LAB — OB RESULTS CONSOLE GBS: STREP GROUP B AG: NEGATIVE

## 2013-11-17 ENCOUNTER — Other Ambulatory Visit (HOSPITAL_COMMUNITY): Payer: Self-pay | Admitting: Advanced Practice Midwife

## 2013-12-09 ENCOUNTER — Encounter (HOSPITAL_COMMUNITY): Payer: Self-pay | Admitting: *Deleted

## 2013-12-09 ENCOUNTER — Telehealth (HOSPITAL_COMMUNITY): Payer: Self-pay | Admitting: *Deleted

## 2013-12-09 NOTE — Telephone Encounter (Signed)
Preadmission screen  

## 2013-12-11 ENCOUNTER — Encounter (HOSPITAL_COMMUNITY): Payer: Self-pay | Admitting: *Deleted

## 2013-12-11 ENCOUNTER — Inpatient Hospital Stay (HOSPITAL_COMMUNITY)
Admission: AD | Admit: 2013-12-11 | Discharge: 2013-12-11 | Disposition: A | Discharge status: Home / Self Care | Payer: Managed Care, Other (non HMO) | Source: Ambulatory Visit | Attending: Obstetrics and Gynecology | Admitting: Obstetrics and Gynecology

## 2013-12-11 DIAGNOSIS — O479 False labor, unspecified: Secondary | ICD-10-CM | POA: Insufficient documentation

## 2013-12-11 MED ORDER — OXYCODONE-ACETAMINOPHEN 5-325 MG PO TABS
2.0000 | ORAL_TABLET | Freq: Once | ORAL | Status: AC
  Administered 2013-12-11: 2 via ORAL
  Filled 2013-12-11: qty 2

## 2013-12-11 NOTE — Progress Notes (Signed)
Dr Cranford Mon on unit, orders received to discharge home

## 2013-12-11 NOTE — Discharge Instructions (Signed)
Fetal Movement Counts Patient Name: Jenna Mcclure Patient Due Date: ____________________ Performing a fetal movement count is highly recommended in high-risk pregnancies, but it is good for every pregnant woman to do. Your caregiver may ask you to start counting fetal movements at 28 weeks of the pregnancy. Fetal movements often increase:  After eating a full meal.  After physical activity.  After eating or drinking something sweet or cold.  At rest. Pay attention to when you feel the baby is most active. This will help you notice a pattern of your baby's sleep and wake cycles and what factors contribute to an increase in fetal movement. It is important to perform a fetal movement count at the same time each day when your baby is normally most active.  HOW TO COUNT FETAL MOVEMENTS 1. Find a quiet and comfortable area to sit or lie down on your left side. Lying on your left side provides the best blood and oxygen circulation to your baby. 2. Write down the day and time on a sheet of paper or in a journal. 3. Start counting kicks, flutters, swishes, rolls, or jabs in a 2 hour period. You should feel at least 10 movements within 2 hours. 4. If you do not feel 10 movements in 2 hours, wait 2-3 hours and count again. Look for a change in the pattern or not enough counts in 2 hours. SEEK MEDICAL CARE IF:  You feel less than 10 counts in 2 hours, tried twice.  There is no movement in over an hour.  The pattern is changing or taking longer each day to reach 10 counts in 2 hours.  You feel the baby is not moving as he or she usually does. Date: ____________ Movements: ____________ Start time: ____________ Elizebeth Koller time: ____________  Date: ____________ Movements: ____________ Start time: ____________ Elizebeth Koller time: ____________ Date: ____________ Movements: ____________ Start time: ____________ Elizebeth Koller time: ____________ Date: ____________ Movements: ____________  Start time: ____________ Elizebeth Koller time: ____________ Date: ____________ Movements: ____________ Start time: ____________ Elizebeth Koller time: ____________ Date: ____________ Movements: ____________ Start time: ____________ Elizebeth Koller time: ____________ Date: ____________ Movements: ____________ Start time: ____________ Elizebeth Koller time: ____________ Date: ____________ Movements: ____________ Start time: ____________ Elizebeth Koller time: ____________  Date: ____________ Movements: ____________ Start time: ____________ Elizebeth Koller time: ____________ Date: ____________ Movements: ____________ Start time: ____________ Elizebeth Koller time: ____________ Date: ____________ Movements: ____________ Start time: ____________ Elizebeth Koller time: ____________ Date: ____________ Movements: ____________ Start time: ____________ Elizebeth Koller time: ____________ Date: ____________ Movements: ____________ Start time: ____________ Elizebeth Koller time: ____________ Date: ____________ Movements: ____________ Start time: ____________ Elizebeth Koller time: ____________ Date: ____________ Movements: ____________ Start time: ____________ Elizebeth Koller time: ____________  Date: ____________ Movements: ____________ Start time: ____________ Elizebeth Koller time: ____________ Date: ____________ Movements: ____________ Start time: ____________ Elizebeth Koller time: ____________ Date: ____________ Movements: ____________ Start time: ____________ Elizebeth Koller time: ____________ Date: ____________ Movements: ____________ Start time: ____________ Elizebeth Koller time: ____________ Date: ____________ Movements: ____________ Start time: ____________ Elizebeth Koller time: ____________ Date: ____________ Movements: ____________ Start time: ____________ Elizebeth Koller time: ____________ Date: ____________ Movements: ____________ Start time: ____________ Elizebeth Koller time: ____________  Date: ____________ Movements: ____________ Start time: ____________ Elizebeth Koller time: ____________ Date: ____________ Movements: ____________ Start time: ____________ Elizebeth Koller time:  ____________ Date: ____________ Movements: ____________ Start time: ____________ Elizebeth Koller time: ____________ Date: ____________ Movements: ____________ Start time: ____________ Elizebeth Koller time: ____________ Date: ____________ Movements: ____________ Start time: ____________ Elizebeth Koller time: ____________ Date: ____________ Movements: ____________ Start time: ____________ Elizebeth Koller time: ____________ Date: ____________ Movements: ____________ Start time: ____________ Elizebeth Koller time: ____________  Date: ____________ Movements: ____________ Start time: ____________ Elizebeth Koller time:  ____________ Date: ____________ Movements: ____________ Start time: ____________ Elizebeth Koller time: ____________ Date: ____________ Movements: ____________ Start time: ____________ Elizebeth Koller time: ____________ Date: ____________ Movements: ____________ Start time: ____________ Elizebeth Koller time: ____________ Date: ____________ Movements: ____________ Start time: ____________ Elizebeth Koller time: ____________ Date: ____________ Movements: ____________ Start time: ____________ Elizebeth Koller time: ____________ Date: ____________ Movements: ____________ Start time: ____________ Elizebeth Koller time: ____________  Date: ____________ Movements: ____________ Start time: ____________ Elizebeth Koller time: ____________ Date: ____________ Movements: ____________ Start time: ____________ Elizebeth Koller time: ____________ Date: ____________ Movements: ____________ Start time: ____________ Elizebeth Koller time: ____________ Date: ____________ Movements: ____________ Start time: ____________ Elizebeth Koller time: ____________ Date: ____________ Movements: ____________ Start time: ____________ Elizebeth Koller time: ____________ Date: ____________ Movements: ____________ Start time: ____________ Elizebeth Koller time: ____________ Date: ____________ Movements: ____________ Start time: ____________ Elizebeth Koller time: ____________  Date: ____________ Movements: ____________ Start time: ____________ Elizebeth Koller time: ____________ Date: ____________ Movements:  ____________ Start time: ____________ Elizebeth Koller time: ____________ Date: ____________ Movements: ____________ Start time: ____________ Elizebeth Koller time: ____________ Date: ____________ Movements: ____________ Start time: ____________ Elizebeth Koller time: ____________ Date: ____________ Movements: ____________ Start time: ____________ Elizebeth Koller time: ____________ Date: ____________ Movements: ____________ Start time: ____________ Elizebeth Koller time: ____________ Date: ____________ Movements: ____________ Start time: ____________ Elizebeth Koller time: ____________  Date: ____________ Movements: ____________ Start time: ____________ Elizebeth Koller time: ____________ Date: ____________ Movements: ____________ Start time: ____________ Elizebeth Koller time: ____________ Date: ____________ Movements: ____________ Start time: ____________ Elizebeth Koller time: ____________ Date: ____________ Movements: ____________ Start time: ____________ Elizebeth Koller time: ____________ Date: ____________ Movements: ____________ Start time: ____________ Elizebeth Koller time: ____________ Date: ____________ Movements: ____________ Start time: ____________ Elizebeth Koller time: ____________ Document Released: 06/19/2006 Document Revised: 05/06/2012 Document Reviewed: 03/16/2012 [employer] Patient Information 2015 [employer]. This information is not intended to replace advice given to you by your health care provider. Make sure you discuss any questions you have with your health care provider. Natural Childbirth Natural childbirth is going through labor and delivery without any drugs to relieve pain. You also do not use fetal monitors, have a cesarean delivery, or get a sugical cut to enlarge the vaginal opening (episiotomy). With the help of a birthing professional (midwife), you will direct your own labor and delivery as you choose. Many women chose natural childbirth because they feel more in control and in touch with their labor and delivery. They are also concerned about the medications affecting  themselves and the baby. Pregnant women with a high risk pregnancy should not attempt natural childbirth. It is better to deliver the infant in a hospital if an emergency situation arises. Sometimes, the caregiver has to intervene for the health and safety of the mother and infant. TWO TECHNIQUES FOR NATURAL CHILDBIRTH:   The Lamaze method. This method teaches women that having a baby is normal, healthy, and natural. It also teaches the mother to take a neutral position regarding pain medication and anesthesia and to make an informed decision if and when it is right for them.  The Hulan Fray (also called husband coached birth). This method teaches the father to be the birth coach and stresses a natural approach. It also encourages exercise and a balanced diet with good nutrition. The exercises teach relaxation and deep breathing techniques. However, there are also classes to prepare the parents for an emergency situation that may occur. METHODS OF DEALING WITH LABOR PAIN AND DELIVERY:  Meditation.  Yoga.  Hypnosis.  Acupuncture.  Massage.  Changing positions (walking, rocking, showering, leaning on birth balls).  Lying in warm water or a jacuzzi.  Find an activity that keeps your mind off of the labor  pain.  Listen to soft music.  Visual imagery (focus on a particular object). BEFORE GOING INTO LABOR  Be sure you and your spouse/partner are in agreement to have natural childbirth.  Decide if your caregiver or a midwife will deliver your baby.  Decide if you will have your baby in the hospital, birthing center, or at home.  If you have children, make plans to have someone to take care of them when you go to the hospital.  Know the distance and the time it takes to go to the delivery center. Make a dry run to be sure.  Have a bag packed with a night gown, bathrobe, and toiletries ready to take when you go into labor.  Keep phone numbers of your family and friends handy if  you need to call someone when you go into labor.  Your spouse or partner should go to all the teaching classes.  Talk with your caregiver about the possibility of a medical emergency and what will happen if that occurs. ADVANTAGES OF NATURAL CHILDBIRTH  You are in control of your labor and delivery.  It is safe.  There are no medications or anesthetics that may affect you and the fetus.  There are no invasive procedures such as an episiotomy.  You and your partner will work together, which can increase your bond.  Meditation, yoga, massage, and breathing exercises can be learned while pregnant and help you when you are in labor and at delivery.  In most delivery centers, the family and friends can be involved in the labor and delivery process. DISADVANTAGES OF NATURAL CHILDBIRTH  You will experience pain during your labor and delivery.  The methods of helping relieve your labor pains may not work for you.  You may feel embarrassed, disappointed, and like a failure if you decide to change your mind during labor and not have natural childbirth. AFTER THE DELIVERY  You will be very tired.  You will be uncomfortable because of your uterus contracting. You will feel soreness around the vagina.  You may feel cold and shaky.This is a natural reaction.  You will be excited, overwhelmed, accomplished, and proud to be a mother. HOME CARE INSTRUCTIONS   Follow the advice and instructions of your caregiver.  Follow the instructions of your natural childbirth instructor ([place]aze or [place]). Document Released: 05/02/2008 Document Revised: 08/12/2011 Document Reviewed: 01/25/2013 [facility] Patient Information 2015 [place], [place]. This information is not intended to replace advice given to you by your health care provider. Make sure you discuss any questions you have with your health care provider.

## 2013-12-11 NOTE — MAU Note (Signed)
Pt presents to MAU with complaints of contractions that started last night but are now 6 mins apart. Denies any vaginal bleeding or LOF

## 2013-12-12 ENCOUNTER — Inpatient Hospital Stay (HOSPITAL_COMMUNITY)
Admission: AD | Admit: 2013-12-12 | Discharge: 2013-12-15 | DRG: 774 | Disposition: A | Discharge status: Home / Self Care | Payer: Managed Care, Other (non HMO) | Source: Ambulatory Visit | Attending: Obstetrics & Gynecology | Admitting: Obstetrics & Gynecology

## 2013-12-12 ENCOUNTER — Encounter (HOSPITAL_COMMUNITY): Payer: Self-pay | Admitting: *Deleted

## 2013-12-12 DIAGNOSIS — F329 Major depressive disorder, single episode, unspecified: Secondary | ICD-10-CM | POA: Diagnosis present

## 2013-12-12 DIAGNOSIS — K219 Gastro-esophageal reflux disease without esophagitis: Secondary | ICD-10-CM | POA: Diagnosis present

## 2013-12-12 DIAGNOSIS — Z833 Family history of diabetes mellitus: Secondary | ICD-10-CM

## 2013-12-12 DIAGNOSIS — Z3490 Encounter for supervision of normal pregnancy, unspecified, unspecified trimester: Secondary | ICD-10-CM

## 2013-12-12 DIAGNOSIS — F3289 Other specified depressive episodes: Secondary | ICD-10-CM | POA: Diagnosis present

## 2013-12-12 DIAGNOSIS — O1002 Pre-existing essential hypertension complicating childbirth: Principal | ICD-10-CM | POA: Diagnosis present

## 2013-12-12 DIAGNOSIS — R011 Cardiac murmur, unspecified: Secondary | ICD-10-CM | POA: Diagnosis present

## 2013-12-12 DIAGNOSIS — D649 Anemia, unspecified: Secondary | ICD-10-CM | POA: Diagnosis present

## 2013-12-12 DIAGNOSIS — O99344 Other mental disorders complicating childbirth: Secondary | ICD-10-CM | POA: Diagnosis present

## 2013-12-12 DIAGNOSIS — O9902 Anemia complicating childbirth: Secondary | ICD-10-CM | POA: Diagnosis present

## 2013-12-12 LAB — CBC
HEMATOCRIT: 35.6 % — AB (ref 36.0–46.0)
HEMOGLOBIN: 12 g/dL (ref 12.0–15.0)
MCH: 29.7 pg (ref 26.0–34.0)
MCHC: 33.7 g/dL (ref 30.0–36.0)
MCV: 88.1 fL (ref 78.0–100.0)
Platelets: 186 10*3/uL (ref 150–400)
RBC: 4.04 MIL/uL (ref 3.87–5.11)
RDW: 14.4 % (ref 11.5–15.5)
WBC: 10.2 10*3/uL (ref 4.0–10.5)

## 2013-12-12 LAB — RPR

## 2013-12-12 MED ORDER — LACTATED RINGERS IV SOLN
INTRAVENOUS | Status: DC
  Administered 2013-12-12 – 2013-12-13 (×3): via INTRAVENOUS
  Administered 2013-12-13: 125 mL/h via INTRAVENOUS

## 2013-12-12 MED ORDER — PHENYLEPHRINE 40 MCG/ML (10ML) SYRINGE FOR IV PUSH (FOR BLOOD PRESSURE SUPPORT)
80.0000 ug | PREFILLED_SYRINGE | INTRAVENOUS | Status: DC | PRN
  Filled 2013-12-12: qty 2

## 2013-12-12 MED ORDER — LACTATED RINGERS IV SOLN: 500.0000 mL | INTRAVENOUS | Status: DC | PRN

## 2013-12-12 MED ORDER — OXYCODONE-ACETAMINOPHEN 5-325 MG PO TABS: 1.0000 | ORAL_TABLET | ORAL | Status: DC | PRN

## 2013-12-12 MED ORDER — EPHEDRINE 5 MG/ML INJ
10.0000 mg | INTRAVENOUS | Status: DC | PRN
  Filled 2013-12-12: qty 2

## 2013-12-12 MED ORDER — LACTATED RINGERS IV SOLN
500.0000 mL | Freq: Once | INTRAVENOUS | Status: AC
  Administered 2013-12-13: 500 mL via INTRAVENOUS

## 2013-12-12 MED ORDER — DIPHENHYDRAMINE HCL 50 MG/ML IJ SOLN: 12.5000 mg | INTRAMUSCULAR | Status: DC | PRN

## 2013-12-12 MED ORDER — FENTANYL 2.5 MCG/ML BUPIVACAINE 1/10 % EPIDURAL INFUSION (WH - ANES)
14.0000 mL/h | INTRAMUSCULAR | Status: DC | PRN
  Administered 2013-12-13: 14 mL/h via EPIDURAL

## 2013-12-12 MED ORDER — OXYTOCIN 40 UNITS IN LACTATED RINGERS INFUSION - SIMPLE MED: 62.5000 mL/h | INTRAVENOUS | Status: DC

## 2013-12-12 MED ORDER — LIDOCAINE HCL (PF) 1 % IJ SOLN
30.0000 mL | INTRAMUSCULAR | Status: DC | PRN
  Filled 2013-12-12: qty 30

## 2013-12-12 MED ORDER — OXYTOCIN BOLUS FROM INFUSION
500.0000 mL | INTRAVENOUS | Status: DC
  Administered 2013-12-13: 500 mL via INTRAVENOUS

## 2013-12-12 MED ORDER — ONDANSETRON HCL 4 MG/2ML IJ SOLN: 4.0000 mg | Freq: Four times a day (QID) | INTRAMUSCULAR | Status: DC | PRN

## 2013-12-12 MED ORDER — IBUPROFEN 600 MG PO TABS: 600.0000 mg | ORAL_TABLET | Freq: Four times a day (QID) | ORAL | Status: DC | PRN

## 2013-12-12 MED ORDER — BUTORPHANOL TARTRATE 1 MG/ML IJ SOLN
1.0000 mg | INTRAMUSCULAR | Status: DC | PRN
  Administered 2013-12-12 – 2013-12-13 (×3): 1 mg via INTRAVENOUS
  Filled 2013-12-12 (×3): qty 1

## 2013-12-12 MED ORDER — CITRIC ACID-SODIUM CITRATE 334-500 MG/5ML PO SOLN: 30.0000 mL | ORAL | Status: DC | PRN

## 2013-12-12 MED ORDER — ACETAMINOPHEN 325 MG PO TABS: 650.0000 mg | ORAL_TABLET | ORAL | Status: DC | PRN

## 2013-12-12 NOTE — H&P (Signed)
Jenna Mcclure is a 33 y.o. female G6P5 @ 39+6 wks presenting for contractions.  Pt reports regular contractions that are increasing in frequency and duration.  Pt was examined in MAU yesterday and noted to be 0.5cm dilated.  No lof or vb.  Good FM.  History OB History   Grav Para Term Preterm Abortions TAB SAB Ect Mult Living   6 5 5  0 0 0 0 0 0 5     Past Medical History  Diagnosis Date  . Hypertension   . Bilateral ovarian cysts   . OBESITY 12/01/2008    Qualifier: Diagnosis of  By: Carlena Sax  MD, Colletta [place]    . Palpitations 02/28/2010    Qualifier: Diagnosis of  Problem Stop Reason:  By: Tye Savoy MD, Tommi Rumps    . RHINITIS, ALLERGIC 07/31/2006    Qualifier: Diagnosis of  By: Samara Snide    . Abnormal Pap smear     normal for the last 6 yrs  . Hx of gonorrhea   . GERD (gastroesophageal reflux disease)   . Heart murmur     staes has heart murmur but - echo and - EKG   Past Surgical History  Procedure Laterality Date  . Laparoscopy     Family History: family history includes Cancer in her father; Diabetes in her maternal grandmother, maternal uncle, and paternal grandmother; Hypertension in her maternal aunt and mother. Social History:  reports that she has never smoked. She has never used smokeless tobacco. She reports that she does not drink alcohol or use illicit drugs.   Prenatal Transfer Tool  Maternal Diabetes: No Genetic Screening: Declined Maternal Ultrasounds/Referrals: Normal Fetal Ultrasounds or other Referrals:  None Maternal Substance Abuse:  No Significant Maternal Medications:  None Significant Maternal Lab Results:  None Other Comments:  None  ROS  Dilation: 3 Effacement (%): 80 Station: -2;-3 Exam by:: Dr Julien Girt Blood pressure 142/74, pulse 78, temperature 98.2 F (36.8 C), temperature source Oral, resp. rate 16, height 5\' 2"  (1.575 m), weight 93.441 kg (206 lb), last menstrual period 03/08/2013. Exam Physical Exam  Gen - uncomfortable w/ ctx Abd - gravid,  NT  EFW 8.5# Ext - NT Cvx 3/50/-3 - unable to AROM Prenatal labs: ABO, Rh: --/--/B NEG (07/12 1335) Antibody: POS (07/12 1335) Rubella: Immune (01/14 0000) RPR: Nonreactive (01/14 0000)  HBsAg:    HIV: Non-reactive (01/14 0000)  GBS: Negative (06/15 0000)   Assessment/Plan: Labor Epidural prn Exp mngt    Maddyn Lieurance 12/12/2013, 6:25 PM

## 2013-12-12 NOTE — Progress Notes (Signed)
Dr Julien Girt notified of pt's VE, FHR pattern, and contraction pattern, orders received to admit pt.

## 2013-12-12 NOTE — MAU Note (Signed)
Pt presents to MAU with complaints of contractions that are much stronger than yesterday when she was evaluated. Denies any vaginal bleeding or LOF

## 2013-12-12 NOTE — Progress Notes (Signed)
Pt's family member came to desk and requests that pt have sve. Pt states her back hurts and she would like to know if her cervix is doing anything.

## 2013-12-13 ENCOUNTER — Encounter (HOSPITAL_COMMUNITY): Payer: Managed Care, Other (non HMO) | Admitting: Anesthesiology

## 2013-12-13 ENCOUNTER — Inpatient Hospital Stay (HOSPITAL_COMMUNITY): Payer: Managed Care, Other (non HMO) | Admitting: Anesthesiology

## 2013-12-13 ENCOUNTER — Inpatient Hospital Stay (HOSPITAL_COMMUNITY): Admission: RE | Admit: 2013-12-13 | Payer: Managed Care, Other (non HMO) | Source: Ambulatory Visit

## 2013-12-13 ENCOUNTER — Encounter (HOSPITAL_COMMUNITY): Payer: Self-pay | Admitting: *Deleted

## 2013-12-13 DIAGNOSIS — D649 Anemia, unspecified: Secondary | ICD-10-CM

## 2013-12-13 DIAGNOSIS — K219 Gastro-esophageal reflux disease without esophagitis: Secondary | ICD-10-CM

## 2013-12-13 DIAGNOSIS — Z3490 Encounter for supervision of normal pregnancy, unspecified, unspecified trimester: Secondary | ICD-10-CM

## 2013-12-13 DIAGNOSIS — O1002 Pre-existing essential hypertension complicating childbirth: Secondary | ICD-10-CM

## 2013-12-13 DIAGNOSIS — O99344 Other mental disorders complicating childbirth: Secondary | ICD-10-CM

## 2013-12-13 MED ORDER — EPHEDRINE 5 MG/ML INJ
INTRAVENOUS | Status: AC
  Filled 2013-12-13: qty 4

## 2013-12-13 MED ORDER — NALBUPHINE HCL 10 MG/ML IJ SOLN
10.0000 mg | Freq: Once | INTRAMUSCULAR | Status: AC
  Administered 2013-12-13: 10 mg via INTRAVENOUS
  Filled 2013-12-13: qty 1

## 2013-12-13 MED ORDER — PRENATAL MULTIVITAMIN CH
1.0000 | ORAL_TABLET | Freq: Every day | ORAL | Status: DC
  Administered 2013-12-14: 1 via ORAL
  Filled 2013-12-13: qty 1

## 2013-12-13 MED ORDER — BENZOCAINE-MENTHOL 20-0.5 % EX AERO
1.0000 "application " | INHALATION_SPRAY | CUTANEOUS | Status: DC | PRN
  Administered 2013-12-13: 1 via TOPICAL
  Filled 2013-12-13: qty 56

## 2013-12-13 MED ORDER — LIDOCAINE HCL (PF) 1 % IJ SOLN
INTRAMUSCULAR | Status: DC | PRN
  Administered 2013-12-13 (×2): 4 mL

## 2013-12-13 MED ORDER — SENNOSIDES-DOCUSATE SODIUM 8.6-50 MG PO TABS
2.0000 | ORAL_TABLET | ORAL | Status: DC
  Administered 2013-12-13 – 2013-12-14 (×2): 2 via ORAL
  Filled 2013-12-13 (×2): qty 2

## 2013-12-13 MED ORDER — PROMETHAZINE HCL 25 MG/ML IJ SOLN
25.0000 mg | Freq: Once | INTRAMUSCULAR | Status: AC
  Administered 2013-12-13: 25 mg via INTRAMUSCULAR
  Filled 2013-12-13: qty 1

## 2013-12-13 MED ORDER — IBUPROFEN 600 MG PO TABS
600.0000 mg | ORAL_TABLET | Freq: Four times a day (QID) | ORAL | Status: DC
  Administered 2013-12-13 – 2013-12-15 (×7): 600 mg via ORAL
  Filled 2013-12-13 (×7): qty 1

## 2013-12-13 MED ORDER — FENTANYL 2.5 MCG/ML BUPIVACAINE 1/10 % EPIDURAL INFUSION (WH - ANES)
INTRAMUSCULAR | Status: DC | PRN
  Administered 2013-12-13: 13 mL/h via EPIDURAL

## 2013-12-13 MED ORDER — LANOLIN HYDROUS EX OINT: TOPICAL_OINTMENT | CUTANEOUS | Status: DC | PRN

## 2013-12-13 MED ORDER — ONDANSETRON HCL 4 MG PO TABS
4.0000 mg | ORAL_TABLET | ORAL | Status: DC | PRN
Start: 2013-12-13 — End: 2013-12-15

## 2013-12-13 MED ORDER — OXYTOCIN 40 UNITS IN LACTATED RINGERS INFUSION - SIMPLE MED
1.0000 m[IU]/min | INTRAVENOUS | Status: DC
  Administered 2013-12-13: 2 m[IU]/min via INTRAVENOUS
  Filled 2013-12-13: qty 1000

## 2013-12-13 MED ORDER — WITCH HAZEL-GLYCERIN EX PADS
1.0000 | MEDICATED_PAD | CUTANEOUS | Status: DC | PRN
Start: 2013-12-13 — End: 2013-12-15

## 2013-12-13 MED ORDER — TERBUTALINE SULFATE 1 MG/ML IJ SOLN: 0.2500 mg | Freq: Once | INTRAMUSCULAR | Status: DC | PRN

## 2013-12-13 MED ORDER — SIMETHICONE 80 MG PO CHEW: 80.0000 mg | CHEWABLE_TABLET | ORAL | Status: DC | PRN

## 2013-12-13 MED ORDER — FENTANYL 2.5 MCG/ML BUPIVACAINE 1/10 % EPIDURAL INFUSION (WH - ANES)
INTRAMUSCULAR | Status: AC
  Filled 2013-12-13: qty 125

## 2013-12-13 MED ORDER — ONDANSETRON HCL 4 MG/2ML IJ SOLN: 4.0000 mg | INTRAMUSCULAR | Status: DC | PRN

## 2013-12-13 MED ORDER — TETANUS-DIPHTH-ACELL PERTUSSIS 5-2.5-18.5 LF-MCG/0.5 IM SUSP: 0.5000 mL | Freq: Once | INTRAMUSCULAR | Status: DC

## 2013-12-13 MED ORDER — ZOLPIDEM TARTRATE 5 MG PO TABS: 5.0000 mg | ORAL_TABLET | Freq: Every evening | ORAL | Status: DC | PRN

## 2013-12-13 MED ORDER — NALBUPHINE HCL 10 MG/ML IJ SOLN
10.0000 mg | Freq: Once | INTRAMUSCULAR | Status: AC
  Administered 2013-12-13: 10 mg via INTRAMUSCULAR
  Filled 2013-12-13: qty 1

## 2013-12-13 MED ORDER — OXYCODONE-ACETAMINOPHEN 5-325 MG PO TABS
1.0000 | ORAL_TABLET | ORAL | Status: DC | PRN
  Administered 2013-12-14 (×2): 1 via ORAL
  Filled 2013-12-13 (×2): qty 1

## 2013-12-13 MED ORDER — DIBUCAINE 1 % RE OINT: 1.0000 "application " | TOPICAL_OINTMENT | RECTAL | Status: DC | PRN

## 2013-12-13 MED ORDER — DIPHENHYDRAMINE HCL 25 MG PO CAPS
25.0000 mg | ORAL_CAPSULE | Freq: Four times a day (QID) | ORAL | Status: DC | PRN
Start: 2013-12-13 — End: 2013-12-15

## 2013-12-13 MED ORDER — PHENYLEPHRINE 40 MCG/ML (10ML) SYRINGE FOR IV PUSH (FOR BLOOD PRESSURE SUPPORT)
PREFILLED_SYRINGE | INTRAVENOUS | Status: AC
  Filled 2013-12-13: qty 10

## 2013-12-13 NOTE — Progress Notes (Signed)
Jenna Mcclure is a 33 y.o. [id number] at [redacted]w[redacted]d by ultrasound admitted for active labor  Subjective: No c/o.  Feeling pain with CTX but declines epidural.    Objective: BP 126/82  Pulse 90  Temp(Src) 98.1 F (36.7 C) (Oral)  Resp 20  Ht 5\' 2"  (1.575 m)  Wt 206 lb (93.441 kg)  BMI 37.67 kg/m2  LMP 03/08/2013      FHT:  FHR: 130 bpm, variability: moderate,  accelerations:  Present,  decelerations:  Absent UC:   irregular, every 3-4 minutes SVE:   Dilation: 3.5 Effacement (%): 50 Station: -3 Exam by:: Dr Lynnette Caffey  Labs: Lab Results  Component Value Date   WBC 10.2 12/12/2013   HGB 12.0 12/12/2013   HCT 35.6* 12/12/2013   MCV 88.1 12/12/2013   PLT 186 12/12/2013    Assessment / Plan: Augmentation of labor, progressing well  Labor: Progressing normally Preeclampsia:  n/a Fetal Wellbeing:  Category I Pain Control:  Labor support without medications I/D:  n/a Anticipated MOD:  NSVD  Jenna Mcclure 12/13/2013, 8:26 AM

## 2013-12-13 NOTE — Progress Notes (Signed)
Dr Lynnette Caffey notified of pt c/o itching "all over"  And that pt does not want medication for itchig at this time.  No new orders given

## 2013-12-13 NOTE — Anesthesia Preprocedure Evaluation (Signed)
Anesthesia Evaluation  Patient identified by MRN, date of birth, ID band Patient awake    Reviewed: Allergy & Precautions, H&P , NPO status , Patient's Chart, lab work & pertinent test results  Airway Mallampati: III TM Distance: >3 FB Neck ROM: Full    Dental no notable dental hx. (+) Teeth Intact   Pulmonary neg pulmonary ROS,  breath sounds clear to auscultation  Pulmonary exam normal       Cardiovascular hypertension, Pt. on medications + Valvular Problems/Murmurs Rhythm:Regular Rate:Normal     Neuro/Psych PSYCHIATRIC DISORDERS Depression Chronic pain syndrome    GI/Hepatic Neg liver ROS, GERD-  Medicated and Controlled,  Endo/Other  Pbesity  Renal/GU negative Renal ROS  negative genitourinary   Musculoskeletal negative musculoskeletal ROS (+)   Abdominal (+) + obese,   Peds  Hematology  (+) anemia ,   Anesthesia Other Findings   Reproductive/Obstetrics (+) Pregnancy                           Anesthesia Physical Anesthesia Plan  ASA: II  Anesthesia Plan: General   Post-op Pain Management:    Induction:   Airway Management Planned: Natural Airway  Additional Equipment:   Intra-op Plan:   Post-operative Plan:   Informed Consent: I have reviewed the patients History and Physical, chart, labs and discussed the procedure including the risks, benefits and alternatives for the proposed anesthesia with the patient or authorized representative who has indicated his/her understanding and acceptance.     Plan Discussed with: Anesthesiologist  Anesthesia Plan Comments:         Anesthesia Quick Evaluation

## 2013-12-13 NOTE — Progress Notes (Signed)
Infant skin to skin for 7 minutes.  Mom states she is too sleepy to hold infant.  Infant wrapped in blankets and given to father

## 2013-12-13 NOTE — Anesthesia Procedure Notes (Signed)
Epidural Patient location during procedure: OB Start time: 12/13/2013 1:33 PM  Staffing Anesthesiologist: Jnya Brossard A. Performed by: anesthesiologist   Preanesthetic Checklist Completed: patient identified, site marked, surgical consent, pre-op evaluation, timeout performed, IV checked, risks and benefits discussed and monitors and equipment checked  Epidural Patient position: sitting Prep: site prepped and draped and DuraPrep Patient monitoring: continuous pulse ox and blood pressure Approach: midline Location: L3-L4 Injection technique: LOR air  Needle:  Needle type: Tuohy  Needle gauge: 17 G Needle length: 9 cm and 9 Needle insertion depth: 6 cm Catheter type: closed end flexible Catheter size: 19 Gauge Catheter at skin depth: 11 cm Test dose: negative and Other  Assessment Events: blood not aspirated, injection not painful, no injection resistance, negative IV test and no paresthesia  Additional Notes Patient identified. Risks and benefits discussed including failed block, incomplete  Pain control, post dural puncture headache, nerve damage, paralysis, blood pressure Changes, nausea, vomiting, reactions to medications-both toxic and allergic and post Partum back pain. All questions were answered. Patient expressed understanding and wished to proceed. Sterile technique was used throughout procedure. Epidural site was Dressed with sterile barrier dressing. No paresthesias, signs of intravascular injection Or signs of intrathecal spread were encountered. Difficult with multiple attempts. Extremely poor positioning with accentuated lumbar lordosis. Patient was more comfortable after the epidural was dosed. Please see RN's note for documentation of vital signs and FHR which are stable.

## 2013-12-13 NOTE — Progress Notes (Signed)
Called Dr. Mardene Sayer at patients request.  Patient may continue to have Stadol for pain.

## 2013-12-13 NOTE — Progress Notes (Signed)
Pt up to bathroom, voided and then up in chair Pt requesting epidural

## 2013-12-13 NOTE — Lactation Note (Signed)
This note was copied from the chart of Jenna Tomekia Cathers. Lactation Consultation Note Initial visit at 3 hours of age.  Mom is attempting latch in cradle hold and baby is mouthing breast, and only a few sucks with latches on and off.  Instructions given to attempt other positions to have more head control to get a deep latch.  [facility] LC resources given and discussed.  Encouraged to feed with early cues on demand.  Early newborn behavior discussed.  Hand expression demonstrated with colostrum visible.  Mom to call for assist as needed.    Patient Name: Jenna Mcclure [id number] Date: 12/13/2013 Reason for consult: Initial assessment   Maternal Data Infant to breast within first hour of birth: Yes Has patient been taught Hand Expression?: Yes Does the patient have breastfeeding experience prior to this delivery?: Yes  Feeding Feeding Type: Breast Fed Length of feed: 2 min  LATCH Score/Interventions Latch: Too sleepy or reluctant, no latch achieved, no sucking elicited.  Audible Swallowing: None Intervention(s): Skin to skin;Hand expression  Type of Nipple: Everted at rest and after stimulation  Comfort (Breast/Nipple): Soft / non-tender     Hold (Positioning): Assistance needed to correctly position infant at breast and maintain latch. Intervention(s): Breastfeeding basics reviewed;Support Pillows;Position options;Skin to skin  LATCH Score: 5  Lactation Tools Discussed/Used     Consult Status Consult Status: Follow-up Date: 12/14/13 Follow-up type: In-patient    Justice Britain 12/13/2013, 6:14 PM

## 2013-12-13 NOTE — Progress Notes (Signed)
Pt c/o itcing.  RN offered to call MD for rx for itching pt does not want rx for itching at this time

## 2013-12-14 LAB — CBC
HCT: 29.8 % — ABNORMAL LOW (ref 36.0–46.0)
Hemoglobin: 9.9 g/dL — ABNORMAL LOW (ref 12.0–15.0)
MCH: 29.7 pg (ref 26.0–34.0)
MCHC: 33.2 g/dL (ref 30.0–36.0)
MCV: 89.5 fL (ref 78.0–100.0)
Platelets: 176 K/uL (ref 150–400)
RBC: 3.33 MIL/uL — ABNORMAL LOW (ref 3.87–5.11)
RDW: 14.6 % (ref 11.5–15.5)
WBC: 12.8 K/uL — ABNORMAL HIGH (ref 4.0–10.5)

## 2013-12-14 LAB — SURGICAL PCR SCREEN
MRSA, PCR: NEGATIVE
Staphylococcus aureus: NEGATIVE

## 2013-12-14 MED ORDER — RHO D IMMUNE GLOBULIN 1500 UNIT/2ML IJ SOSY
300.0000 ug | PREFILLED_SYRINGE | Freq: Once | INTRAMUSCULAR | Status: AC
  Administered 2013-12-14: 300 ug via INTRAMUSCULAR
  Filled 2013-12-14: qty 2

## 2013-12-14 NOTE — Progress Notes (Signed)
Post Partum Day 1 Subjective: no complaints, up ad lib, voiding, tolerating PO and patient is NPO desires tubal ligation  Objective: Blood pressure 117/70, pulse 86, temperature 98.4 F (36.9 C), temperature source Oral, resp. rate 18, height 5\' 2"  (1.575 m), weight 206 lb (93.441 kg), last menstrual period 03/08/2013, unknown if currently breastfeeding.  Physical Exam:  General: alert and cooperative Lochia: appropriate Uterine Fundus: firm Incision: perineum intact DVT Evaluation: No evidence of DVT seen on physical exam. Negative Homan's sign. No cords or calf tenderness. No significant calf/ankle edema.   Recent Labs  12/12/13 1335 12/14/13 0600  HGB 12.0 9.9*  HCT 35.6* 29.8*    Assessment/Plan: Plan for discharge tomorrow   LOS: 2 days   Chanson Teems G 12/14/2013, 9:09 AM

## 2013-12-14 NOTE — Anesthesia Postprocedure Evaluation (Signed)
  Anesthesia Post-op Note  Patient: Jenna Mcclure  Procedure(s) Performed: * No procedures listed *  Patient Location: Mother/Baby  Anesthesia Type:Epidural  Level of Consciousness: awake, alert , oriented and patient cooperative  Airway and Oxygen Therapy: Patient Spontanous Breathing  Post-op Pain: none  Post-op Assessment: Post-op Vital signs reviewed, Patient's Cardiovascular Status Stable, Respiratory Function Stable, Patent Airway, No signs of Nausea or vomiting, Adequate PO intake, Pain level controlled, Pain level not controlled and No headache  Post-op Vital Signs: Reviewed and stable  Last Vitals:  Filed Vitals:   12/14/13 0608  BP: 117/70  Pulse: 86  Temp: 36.9 C  Resp: 18    Complications: No apparent anesthesia complications

## 2013-12-15 LAB — RH IG WORKUP (INCLUDES ABO/RH)
ABO/RH(D): B NEG
FETAL SCREEN: NEGATIVE
GESTATIONAL AGE(WKS): 40
Unit division: 0

## 2013-12-15 LAB — TYPE AND SCREEN
ABO/RH(D): B NEG
ANTIBODY SCREEN: POSITIVE
DAT, IgG: NEGATIVE
Unit division: 0

## 2013-12-15 MED ORDER — OXYCODONE-ACETAMINOPHEN 5-325 MG PO TABS: 1.0000 | ORAL_TABLET | ORAL | Status: DC | PRN

## 2013-12-15 MED ORDER — IBUPROFEN 600 MG PO TABS: 600.0000 mg | ORAL_TABLET | Freq: Four times a day (QID) | ORAL | Status: DC

## 2013-12-15 NOTE — Discharge Summary (Signed)
Obstetric Discharge Summary Reason for Admission: onset of labor Prenatal Procedures: ultrasound Intrapartum Procedures: spontaneous vaginal delivery Postpartum Procedures: none Complications-Operative and Postpartum: none Hemoglobin  Date Value Ref Range Status  12/14/2013 9.9* 12.0 - 15.0 g/dL Final     DELTA CHECK NOTED     REPEATED TO VERIFY  01/10/2013 13.2  12.2 - 16.2 g/dL Final     HCT  Date Value Ref Range Status  12/14/2013 29.8* 36.0 - 46.0 % Final     HCT, POC  Date Value Ref Range Status  01/10/2013 41.7  37.7 - 47.9 % Final    Physical Exam:  General: alert and cooperative, patient reports baby has had low grade fever. No antibiotics have been required Lochia: appropriate Uterine Fundus: firm Incision: perineum intact DVT Evaluation: No evidence of DVT seen on physical exam. Negative Homan's sign. No cords or calf tenderness. No significant calf/ankle edema.  Discharge Diagnoses: Term Pregnancy-delivered  Discharge Information: Date: 12/15/2013 Activity: pelvic rest Diet: routine Medications: Ibuprofen and Percocet Condition: stable Instructions: refer to practice specific booklet Discharge to: home   Newborn Data: Live born female  Birth Weight: 8 lb 15.6 oz (4071 g) APGAR: 8, 9  Home with mother.  Cash Duce G 12/15/2013, 8:51 AM

## 2014-01-24 ENCOUNTER — Other Ambulatory Visit: Payer: Self-pay | Admitting: Obstetrics and Gynecology

## 2014-01-25 LAB — CYTOLOGY - PAP

## 2014-02-08 ENCOUNTER — Other Ambulatory Visit: Payer: Self-pay | Admitting: Obstetrics and Gynecology

## 2014-02-08 DIAGNOSIS — N63 Unspecified lump in unspecified breast: Secondary | ICD-10-CM

## 2014-02-10 ENCOUNTER — Ambulatory Visit
Admission: RE | Admit: 2014-02-10 | Discharge: 2014-02-10 | Disposition: A | Discharge status: Home / Self Care | Payer: Private Health Insurance - Indemnity | Source: Ambulatory Visit | Attending: Obstetrics and Gynecology | Admitting: Obstetrics and Gynecology

## 2014-02-10 ENCOUNTER — Ambulatory Visit
Admission: RE | Admit: 2014-02-10 | Discharge: 2014-02-10 | Disposition: A | Discharge status: Home / Self Care | Payer: Managed Care, Other (non HMO) | Source: Ambulatory Visit | Attending: Obstetrics and Gynecology | Admitting: Obstetrics and Gynecology

## 2014-02-10 ENCOUNTER — Other Ambulatory Visit: Payer: Self-pay | Admitting: Obstetrics and Gynecology

## 2014-02-10 ENCOUNTER — Encounter (HOSPITAL_BASED_OUTPATIENT_CLINIC_OR_DEPARTMENT_OTHER): Payer: Self-pay | Admitting: *Deleted

## 2014-02-10 DIAGNOSIS — N63 Unspecified lump in unspecified breast: Secondary | ICD-10-CM

## 2014-02-10 NOTE — Progress Notes (Signed)
NPO AFTER MN. ARRIVE AT 0600. GETTING LAB WORK DONE ON Monday 02-14-2014 .  WILL TAKE ZANTAC AM DOS W/ SIPS OF WATER.

## 2014-02-14 ENCOUNTER — Encounter (HOSPITAL_BASED_OUTPATIENT_CLINIC_OR_DEPARTMENT_OTHER): Payer: Self-pay | Admitting: Anesthesiology

## 2014-02-14 DIAGNOSIS — Z302 Encounter for sterilization: Secondary | ICD-10-CM | POA: Diagnosis present

## 2014-02-14 LAB — CBC
HEMATOCRIT: 38 % (ref 36.0–46.0)
Hemoglobin: 12.9 g/dL (ref 12.0–15.0)
MCH: 28.4 pg (ref 26.0–34.0)
MCHC: 33.9 g/dL (ref 30.0–36.0)
MCV: 83.7 fL (ref 78.0–100.0)
Platelets: 221 10*3/uL (ref 150–400)
RBC: 4.54 MIL/uL (ref 3.87–5.11)
RDW: 14.7 % (ref 11.5–15.5)
WBC: 6.4 10*3/uL (ref 4.0–10.5)

## 2014-02-14 LAB — HCG, SERUM, QUALITATIVE: PREG SERUM: NEGATIVE

## 2014-02-14 NOTE — H&P (Signed)
MAANSI WIKE  DICTATION # [id number] CSN# [id number]   Margarette Asal, MD 02/14/2014 8:27 AM

## 2014-02-14 NOTE — Anesthesia Preprocedure Evaluation (Addendum)
Anesthesia Evaluation  Patient identified by MRN, date of birth, ID band Patient awake    Reviewed: Allergy & Precautions, H&P , NPO status , Patient's Chart, lab work & pertinent test results  Airway Mallampati: II TM Distance: >3 FB Neck ROM: Full    Dental no notable dental hx.    Pulmonary neg pulmonary ROS,  breath sounds clear to auscultation  Pulmonary exam normal       Cardiovascular hypertension, negative cardio ROS  Rhythm:Regular Rate:Normal     Neuro/Psych PSYCHIATRIC DISORDERS Depression negative neurological ROS     GI/Hepatic Neg liver ROS, GERD-  Medicated,  Endo/Other  negative endocrine ROS  Renal/GU negative Renal ROS  negative genitourinary   Musculoskeletal negative musculoskeletal ROS (+)   Abdominal (+) + obese,   Peds negative pediatric ROS (+)  Hematology negative hematology ROS (+)   Anesthesia Other Findings   Reproductive/Obstetrics negative OB ROS                           Anesthesia Physical Anesthesia Plan  ASA: II  Anesthesia Plan: General   Post-op Pain Management:    Induction: Intravenous  Airway Management Planned: Oral ETT  Additional Equipment:   Intra-op Plan:   Post-operative Plan: Extubation in OR  Informed Consent: I have reviewed the patients History and Physical, chart, labs and discussed the procedure including the risks, benefits and alternatives for the proposed anesthesia with the patient or authorized representative who has indicated his/her understanding and acceptance.   Dental advisory given  Plan Discussed with: CRNA  Anesthesia Plan Comments: (Breastfeeding after anesthesia discussed with Ms. Cass. She will delay breastfeeding for 10-12 hours.)       Anesthesia Quick Evaluation

## 2014-02-14 NOTE — H&P (Signed)
Jenna Mcclure, HARNDEN                 ACCOUNT NO.:  [id number]  MEDICAL RECORD NO.:  [id number]  LOCATION:                                 FACILITY:  PHYSICIAN:  Ralene Bathe. Matthew Saras, M.D.DATE OF BIRTH:  09/13/1980  DATE OF ADMISSION: DATE OF DISCHARGE:                             HISTORY & PHYSICAL   CHIEF COMPLAINT:  Requests permanent sterilization.  HPI:  A 33 year old, G6, now P6, delivered recently, presents now for laparoscopic tubal ligation with Filshie clips.  The permanence of this procedure, failure rated 2 to 08/998, other risks related to laparoscopy including bleeding, infection, adjacent organ injury, the possible need for additional surgery reviewed with her which she understands and accepts.  PAST MEDICAL HISTORY:  Allergies, none.  OPERATIONS:  Diagnostic laparoscopy, 2012.  Normal except for possible adenomyosis.  REVIEW OF SYSTEMS:  Significant for history of abnormal Paps in the past, gestational hypertension.  FAMILY HISTORY:  Significant for mother with hypertension.  Grandparents with diabetes.  PHYSICAL EXAMINATION:  VITAL SIGNS:  Temp 98.2, blood pressure 120/78. HEENT:  Unremarkable. NECK:  Supple without masses. LUNGS:  Clear. CARDIOVASCULAR:  Regular rate and rhythm, without murmurs, rubs, gallops. BREASTS:  Without masses. ABDOMEN:  Soft, flat, nontender.  Vulva, vagina, cervix normal.  Uterus mid position, upper limit of normal size, mobile.  Adnexa negative. EXTREMITIES:  Unremarkable. NEUROLOGIC:  Unremarkable.  IMPRESSION:  Requests permanent sterilization.  PLAN:  Laparoscopic tubal with Filshie clips, procedure and risks reviewed as above.    Towanna Avery M. Matthew Saras, M.D.    [facility]/MEDQ  D:  02/14/2014  T:  02/14/2014  Job:  [id number]

## 2014-02-15 ENCOUNTER — Encounter (HOSPITAL_BASED_OUTPATIENT_CLINIC_OR_DEPARTMENT_OTHER)
Admission: RE | Disposition: A | Discharge status: Home / Self Care | Payer: Self-pay | Source: Ambulatory Visit | Attending: Obstetrics and Gynecology

## 2014-02-15 ENCOUNTER — Ambulatory Visit (HOSPITAL_BASED_OUTPATIENT_CLINIC_OR_DEPARTMENT_OTHER)
Admission: RE | Admit: 2014-02-15 | Discharge: 2014-02-15 | Disposition: A | Discharge status: Home / Self Care | Payer: Managed Care, Other (non HMO) | Source: Ambulatory Visit | Attending: Obstetrics and Gynecology | Admitting: Obstetrics and Gynecology

## 2014-02-15 ENCOUNTER — Encounter (HOSPITAL_BASED_OUTPATIENT_CLINIC_OR_DEPARTMENT_OTHER): Payer: Managed Care, Other (non HMO) | Admitting: Anesthesiology

## 2014-02-15 ENCOUNTER — Ambulatory Visit (HOSPITAL_BASED_OUTPATIENT_CLINIC_OR_DEPARTMENT_OTHER): Payer: Managed Care, Other (non HMO) | Admitting: Anesthesiology

## 2014-02-15 ENCOUNTER — Encounter (HOSPITAL_BASED_OUTPATIENT_CLINIC_OR_DEPARTMENT_OTHER): Payer: Self-pay | Admitting: *Deleted

## 2014-02-15 DIAGNOSIS — Z302 Encounter for sterilization: Secondary | ICD-10-CM | POA: Diagnosis not present

## 2014-02-15 HISTORY — DX: Elevated blood-pressure reading, without diagnosis of hypertension: R03.0

## 2014-02-15 HISTORY — DX: Allergic rhinitis, unspecified: J30.9

## 2014-02-15 HISTORY — PX: LAPAROSCOPIC TUBAL LIGATION: SHX1937

## 2014-02-15 HISTORY — DX: Personal history of other diseases of the female genital tract: Z87.42

## 2014-02-15 HISTORY — DX: Other specified health status: Z78.9

## 2014-02-15 HISTORY — DX: Presence of spectacles and contact lenses: Z97.3

## 2014-02-15 SURGERY — LIGATION, FALLOPIAN TUBE, LAPAROSCOPIC
Anesthesia: General | Laterality: Bilateral

## 2014-02-15 MED ORDER — LACTATED RINGERS IV SOLN
INTRAVENOUS | Status: DC | PRN
  Administered 2014-02-15 (×2): via INTRAVENOUS

## 2014-02-15 MED ORDER — FENTANYL CITRATE 0.05 MG/ML IJ SOLN
INTRAMUSCULAR | Status: AC
  Filled 2014-02-15: qty 2

## 2014-02-15 MED ORDER — LACTATED RINGERS IV SOLN
INTRAVENOUS | Status: DC
  Administered 2014-02-15: 10:00:00 via INTRAVENOUS
  Filled 2014-02-15: qty 1000

## 2014-02-15 MED ORDER — PROPOFOL 10 MG/ML IV BOLUS
INTRAVENOUS | Status: DC | PRN
  Administered 2014-02-15: 200 mg via INTRAVENOUS

## 2014-02-15 MED ORDER — ONDANSETRON HCL 4 MG/2ML IJ SOLN
INTRAMUSCULAR | Status: DC | PRN
  Administered 2014-02-15: 4 mg via INTRAVENOUS

## 2014-02-15 MED ORDER — LIDOCAINE HCL (CARDIAC) 20 MG/ML IV SOLN
INTRAVENOUS | Status: DC | PRN
  Administered 2014-02-15: 60 mg via INTRAVENOUS

## 2014-02-15 MED ORDER — HYDROCODONE-ACETAMINOPHEN 5-325 MG PO TABS
ORAL_TABLET | ORAL | Status: AC
  Filled 2014-02-15: qty 1

## 2014-02-15 MED ORDER — HYDROCODONE-ACETAMINOPHEN 5-325 MG PO TABS
1.0000 | ORAL_TABLET | Freq: Once | ORAL | Status: AC
  Administered 2014-02-15: 1 via ORAL
  Filled 2014-02-15: qty 1

## 2014-02-15 MED ORDER — FENTANYL CITRATE 0.05 MG/ML IJ SOLN
25.0000 ug | INTRAMUSCULAR | Status: DC | PRN
  Administered 2014-02-15 (×3): 25 ug via INTRAVENOUS
  Filled 2014-02-15: qty 0.5

## 2014-02-15 MED ORDER — BUPIVACAINE HCL (PF) 0.25 % IJ SOLN
INTRAMUSCULAR | Status: DC | PRN
  Administered 2014-02-15: 17 mL

## 2014-02-15 MED ORDER — MIDAZOLAM HCL 2 MG/2ML IJ SOLN
INTRAMUSCULAR | Status: AC
  Filled 2014-02-15: qty 2

## 2014-02-15 MED ORDER — FENTANYL CITRATE 0.05 MG/ML IJ SOLN
25.0000 ug | INTRAMUSCULAR | Status: AC | PRN
  Administered 2014-02-15 (×4): 25 ug via INTRAVENOUS
  Administered 2014-02-15: 50 ug via INTRAVENOUS
  Administered 2014-02-15: 25 ug via INTRAVENOUS
  Filled 2014-02-15: qty 1

## 2014-02-15 MED ORDER — DEXAMETHASONE SODIUM PHOSPHATE 4 MG/ML IJ SOLN
INTRAMUSCULAR | Status: DC | PRN
  Administered 2014-02-15: 10 mg via INTRAVENOUS

## 2014-02-15 MED ORDER — ACETAMINOPHEN 10 MG/ML IV SOLN
INTRAVENOUS | Status: DC | PRN
  Administered 2014-02-15: 1000 mg via INTRAVENOUS

## 2014-02-15 MED ORDER — FENTANYL CITRATE 0.05 MG/ML IJ SOLN
INTRAMUSCULAR | Status: DC | PRN
  Administered 2014-02-15 (×2): 25 ug via INTRAVENOUS
  Administered 2014-02-15: 50 ug via INTRAVENOUS
  Administered 2014-02-15 (×4): 25 ug via INTRAVENOUS

## 2014-02-15 MED ORDER — PROMETHAZINE HCL 25 MG/ML IJ SOLN
6.2500 mg | INTRAMUSCULAR | Status: DC | PRN
  Filled 2014-02-15: qty 1

## 2014-02-15 MED ORDER — FENTANYL CITRATE 0.05 MG/ML IJ SOLN
INTRAMUSCULAR | Status: AC
  Filled 2014-02-15: qty 6

## 2014-02-15 MED ORDER — KETOROLAC TROMETHAMINE 30 MG/ML IJ SOLN
INTRAMUSCULAR | Status: DC | PRN
  Administered 2014-02-15: 30 mg via INTRAVENOUS

## 2014-02-15 MED ORDER — HYDROCODONE-IBUPROFEN 7.5-200 MG PO TABS: 1.0000 | ORAL_TABLET | Freq: Three times a day (TID) | ORAL | Status: DC | PRN

## 2014-02-15 MED ORDER — SUCCINYLCHOLINE CHLORIDE 20 MG/ML IJ SOLN
INTRAMUSCULAR | Status: DC | PRN
  Administered 2014-02-15: 120 mg via INTRAVENOUS

## 2014-02-15 SURGICAL SUPPLY — 70 items
ADH SKN CLS APL DERMABOND .7 (GAUZE/BANDAGES/DRESSINGS) ×1
APPLICATOR COTTON TIP 6IN STRL (MISCELLANEOUS) ×3 IMPLANT
BAG SPEC RTRVL LRG 6X4 10 (ENDOMECHANICALS)
BAG URINE DRAINAGE (UROLOGICAL SUPPLIES) IMPLANT
BANDAGE ADHESIVE 1X3 (GAUZE/BANDAGES/DRESSINGS) IMPLANT
BLADE CLIPPER SURG (BLADE) IMPLANT
BLADE SURG 11 STRL SS (BLADE) ×3 IMPLANT
CANISTER SUCTION 2500CC (MISCELLANEOUS) IMPLANT
CATH FOLEY 2WAY SLVR  5CC 16FR (CATHETERS)
CATH FOLEY 2WAY SLVR 5CC 16FR (CATHETERS) IMPLANT
CATH ROBINSON RED A/P 16FR (CATHETERS) ×2 IMPLANT
CLIP FILSHIE TUBAL LIGA STRL (Clip) ×2 IMPLANT
CLOSURE WOUND 1/4X4 (GAUZE/BANDAGES/DRESSINGS)
CLOTH BEACON ORANGE TIMEOUT ST (SAFETY) ×3 IMPLANT
DERMABOND ADVANCED (GAUZE/BANDAGES/DRESSINGS) ×2
DERMABOND ADVANCED .7 DNX12 (GAUZE/BANDAGES/DRESSINGS) IMPLANT
DRAPE CAMERA CLOSED 9X96 (DRAPES) ×3 IMPLANT
DRAPE UNDERBUTTOCKS STRL (DRAPE) ×3 IMPLANT
DRESSING TELFA 8X3 (GAUZE/BANDAGES/DRESSINGS) IMPLANT
DRSG OPSITE POSTOP 3X4 (GAUZE/BANDAGES/DRESSINGS) IMPLANT
ELECT REM PT RETURN 9FT ADLT (ELECTROSURGICAL) ×3
ELECTRODE REM PT RTRN 9FT ADLT (ELECTROSURGICAL) ×1 IMPLANT
FILTER SMOKE EVAC LAPAROSHD (FILTER) IMPLANT
GLOVE BIO SURGEON STRL SZ 6.5 (GLOVE) ×1 IMPLANT
GLOVE BIO SURGEON STRL SZ7 (GLOVE) ×8 IMPLANT
GLOVE BIO SURGEONS STRL SZ 6.5 (GLOVE) ×1
GLOVE BIOGEL PI IND STRL 6.5 (GLOVE) IMPLANT
GLOVE BIOGEL PI INDICATOR 6.5 (GLOVE) ×2
GOWN PREVENTION PLUS LG XLONG (DISPOSABLE) ×6 IMPLANT
LEGGING LITHOTOMY PAIR STRL (DRAPES) ×2 IMPLANT
NDL HYPO 25X1 1.5 SAFETY (NEEDLE) ×1 IMPLANT
NDL INSUFFLATION 14GA 120MM (NEEDLE) ×1 IMPLANT
NDL INSUFFLATION 14GA 150MM (NEEDLE) IMPLANT
NEEDLE HYPO 25X1 1.5 SAFETY (NEEDLE) ×3 IMPLANT
NEEDLE INSUFFLATION 14GA 120MM (NEEDLE) ×3 IMPLANT
NEEDLE INSUFFLATION 14GA 150MM (NEEDLE) IMPLANT
NS IRRIG 500ML POUR BTL (IV SOLUTION) ×3 IMPLANT
PACK BASIN DAY SURGERY FS (CUSTOM PROCEDURE TRAY) ×3 IMPLANT
PACK LAPAROSCOPY II (CUSTOM PROCEDURE TRAY) ×3 IMPLANT
PAD OB MATERNITY 4.3X12.25 (PERSONAL CARE ITEMS) ×3 IMPLANT
PAD PREP 24X48 CUFFED NSTRL (MISCELLANEOUS) ×3 IMPLANT
PENCIL BUTTON HOLSTER BLD 10FT (ELECTRODE) ×2 IMPLANT
POUCH SPECIMEN RETRIEVAL 10MM (ENDOMECHANICALS) IMPLANT
SCALPEL HARMONIC ACE (MISCELLANEOUS) IMPLANT
SCISSORS LAP 5X35 DISP (ENDOMECHANICALS) IMPLANT
SEALER TISSUE G2 CVD JAW 35 (ENDOMECHANICALS) IMPLANT
SEALER TISSUE G2 CVD JAW 45CM (ENDOMECHANICALS) IMPLANT
SET IRRIG TUBING LAPAROSCOPIC (IRRIGATION / IRRIGATOR) IMPLANT
SOLUTION ANTI FOG 6CC (MISCELLANEOUS) ×3 IMPLANT
STRIP CLOSURE SKIN 1/4X4 (GAUZE/BANDAGES/DRESSINGS) IMPLANT
SUT MNCRL AB 3-0 PS2 18 (SUTURE) ×3 IMPLANT
SUT MNCRL AB 4-0 PS2 18 (SUTURE) ×3 IMPLANT
SUT MON AB 2-0 CT1 36 (SUTURE) IMPLANT
SUT PLAIN 4 0 FS 2 27 (SUTURE) IMPLANT
SUT VIC AB 2-0 UR6 27 (SUTURE) IMPLANT
SUT VICRYL 0 TIES 12 18 (SUTURE) IMPLANT
SUT VICRYL 0 UR6 27IN ABS (SUTURE) IMPLANT
SUT VICRYL RAPIDE 3 0 (SUTURE) IMPLANT
SUT VICRYL RAPIDE 4/0 PS 2 (SUTURE) ×6 IMPLANT
SYR 3ML 23GX1 SAFETY (SYRINGE) IMPLANT
SYR CONTROL 10ML LL (SYRINGE) ×3 IMPLANT
SYRINGE 10CC LL (SYRINGE) ×3 IMPLANT
TOWEL OR 17X24 6PK STRL BLUE (TOWEL DISPOSABLE) ×6 IMPLANT
TRAY DSU PREP LF (CUSTOM PROCEDURE TRAY) ×3 IMPLANT
TROCAR OPTI TIP 5M 100M (ENDOMECHANICALS) ×2 IMPLANT
TROCAR XCEL BLUNT TIP 100MML (ENDOMECHANICALS) IMPLANT
TROCAR XCEL DIL TIP R 11M (ENDOMECHANICALS) ×3 IMPLANT
TUBING INSUFFLATION 10FT LAP (TUBING) ×3 IMPLANT
VACUUM HOSE/TUBING 7/8INX6FT (MISCELLANEOUS) IMPLANT
WATER STERILE IRR 500ML POUR (IV SOLUTION) ×3 IMPLANT

## 2014-02-15 NOTE — Anesthesia Postprocedure Evaluation (Signed)
  Anesthesia Post-op Note  Patient: Jenna Mcclure  Procedure(s) Performed: Procedure(s) (LRB): LAPAROSCOPIC TUBAL LIGATION WITH FILSHIE CLIPS (Bilateral)  Patient Location: PACU  Anesthesia Type: General  Level of Consciousness: awake and alert   Airway and Oxygen Therapy: Patient Spontanous Breathing  Post-op Pain: mild  Post-op Assessment: Post-op Vital signs reviewed, Patient's Cardiovascular Status Stable, Respiratory Function Stable, Patent Airway and No signs of Nausea or vomiting  Last Vitals:  Filed Vitals:   02/15/14 1000  BP:   Pulse: 64  Temp:   Resp: 13    Post-op Vital Signs: stable   Complications: No apparent anesthesia complications. BP improved with better pain control. No complaints.

## 2014-02-15 NOTE — Transfer of Care (Signed)
Immediate Anesthesia Transfer of Care Note  Patient: Jenna Mcclure  Procedure(s) Performed: Procedure(s) (LRB): LAPAROSCOPIC TUBAL LIGATION WITH FILSHIE CLIPS (Bilateral)  Patient Location: PACU  Anesthesia Type: General  Level of Consciousness: awake, sedated, patient cooperative and responds to stimulation  Airway & Oxygen Therapy: Patient Spontanous Breathing and Patient connected to face mask oxygen  Post-op Assessment: Report given to PACU RN, Post -op Vital signs reviewed and stable and Patient moving all extremities  Post vital signs: Reviewed and stable  Complications: No apparent anesthesia complications

## 2014-02-15 NOTE — Discharge Instructions (Signed)

## 2014-02-15 NOTE — Progress Notes (Signed)
The patient was re-examined with no change in status 

## 2014-02-15 NOTE — Anesthesia Procedure Notes (Signed)
Procedure Name: Intubation Date/Time: 02/15/2014 7:30 AM Performed by: Justice Rocher Pre-anesthesia Checklist: Patient identified, Emergency Drugs available, Suction available and Patient being monitored Patient Re-evaluated:Patient Re-evaluated prior to inductionOxygen Delivery Method: Circle System Utilized Preoxygenation: Pre-oxygenation with 100% oxygen Intubation Type: IV induction Ventilation: Mask ventilation without difficulty Grade View: Grade II Tube type: Oral Tube size: 7.0 mm Number of attempts: 1 Airway Equipment and Method: stylet and oral airway Placement Confirmation: ETT inserted through vocal cords under direct vision,  positive ETCO2 and breath sounds checked- equal and bilateral Secured at: 22 cm Tube secured with: Tape Dental Injury: Teeth and Oropharynx as per pre-operative assessment

## 2014-02-15 NOTE — Op Note (Signed)
Preoperative diagnosis: Request permanent sterilization  Postoperative diagnosis: Same  Procedure: Laparoscopy with Filshie clip tubal  Surgeon: Matthew Saras  Anesthesia: Gen.  Complications: None  EBL: Less than 10 cc  Procedure and findings:  The patient taken the operating room after an adequate level of general anesthesia was obtained the patient's legs in stirrups, the abdomen perineum and vagina were prepped and draped in the usual fashion bladder was drained at that point. Appropriate timeout for taken., EUA carried out the uterus is midposition, normal size, adnexa negative. Hulka tenaculum was positioned.  Attention directed to the abdomen where the periumbilical area was infiltrated with Marcaine, small incision was made in the varies needle was introduced without difficulty. Its intra-abdominal position was verified by pressure water testing. After 2-1/2 L pneumoperitoneum was then created, laparoscopic trocar and sleeve were then introduced that difficulty. There was no evidence of any bleeding or trauma. Patient was then placed in Trendelenburg decision made to use a suprapubic trocar to improve visualization, 5 mm trocar was inserted 3 finger breaths above the symphysis in the midline under direct visualization a probe was then used to elevate the tubes for better visualization. Quarter percent Marcaine was and draped across each tube from the cornu to the fimbriated end. Pelvic findings revealed anterior posterior cul-de-sac were unremarkable uterus itself was normal size upper abdomen negative. Filshie clip applicator was positioned a Filshie clip applied to the right tube a right ankle completely engulfing the tube several centimeters from the cornu. The exact same repeated on the opposite side after tracing both tubes out to the fimbriated end. Application was photo documented. Its was removed, gas allowed to escape. The lower incision closed Dermabond the umbilical incision with a 4-0  Monocryl subcuticular and pressure dressing. She tolerated this well went to recovery room in good condition.  Dictated with Dr. Magnus Sinning. Garry Heater.D.

## 2014-02-16 ENCOUNTER — Encounter (HOSPITAL_BASED_OUTPATIENT_CLINIC_OR_DEPARTMENT_OTHER): Payer: Self-pay | Admitting: Obstetrics and Gynecology

## 2014-03-08 ENCOUNTER — Telehealth: Payer: Self-pay | Admitting: Obstetrics and Gynecology

## 2014-03-08 NOTE — Telephone Encounter (Signed)
Pt wants to know if she completed the Hepatitis B series Please advise

## 2014-03-08 NOTE — Telephone Encounter (Signed)
Attempted to call back.  Number rang busy x3 then heard recording that number I called has been disconnected.  It seems from our records that pt has not had her 3rd and final immunization.  If she needs this for a job then I would suggest a Hep B titer first. Programmer, systems, [employer]

## 2014-03-18 ENCOUNTER — Other Ambulatory Visit: Payer: Self-pay

## 2014-04-04 ENCOUNTER — Encounter (HOSPITAL_BASED_OUTPATIENT_CLINIC_OR_DEPARTMENT_OTHER): Payer: Self-pay | Admitting: Obstetrics and Gynecology

## 2014-06-18 ENCOUNTER — Emergency Department (HOSPITAL_COMMUNITY)
Admission: EM | Admit: 2014-06-18 | Discharge: 2014-06-18 | Disposition: A | Discharge status: Home / Self Care | Payer: Managed Care, Other (non HMO) | Attending: Emergency Medicine | Admitting: Emergency Medicine

## 2014-06-18 ENCOUNTER — Encounter (HOSPITAL_COMMUNITY): Payer: Self-pay | Admitting: *Deleted

## 2014-06-18 DIAGNOSIS — Z791 Long term (current) use of non-steroidal anti-inflammatories (NSAID): Secondary | ICD-10-CM | POA: Insufficient documentation

## 2014-06-18 DIAGNOSIS — R1031 Right lower quadrant pain: Secondary | ICD-10-CM | POA: Diagnosis not present

## 2014-06-18 DIAGNOSIS — M545 Low back pain, unspecified: Secondary | ICD-10-CM

## 2014-06-18 DIAGNOSIS — Z79899 Other long term (current) drug therapy: Secondary | ICD-10-CM | POA: Diagnosis not present

## 2014-06-18 DIAGNOSIS — Z3202 Encounter for pregnancy test, result negative: Secondary | ICD-10-CM | POA: Insufficient documentation

## 2014-06-18 DIAGNOSIS — R011 Cardiac murmur, unspecified: Secondary | ICD-10-CM | POA: Diagnosis not present

## 2014-06-18 DIAGNOSIS — Z79891 Long term (current) use of opiate analgesic: Secondary | ICD-10-CM | POA: Diagnosis not present

## 2014-06-18 DIAGNOSIS — Z8679 Personal history of other diseases of the circulatory system: Secondary | ICD-10-CM | POA: Diagnosis not present

## 2014-06-18 DIAGNOSIS — K219 Gastro-esophageal reflux disease without esophagitis: Secondary | ICD-10-CM | POA: Diagnosis not present

## 2014-06-18 DIAGNOSIS — R109 Unspecified abdominal pain: Secondary | ICD-10-CM | POA: Diagnosis present

## 2014-06-18 DIAGNOSIS — Z8742 Personal history of other diseases of the female genital tract: Secondary | ICD-10-CM | POA: Diagnosis not present

## 2014-06-18 LAB — URINE MICROSCOPIC-ADD ON

## 2014-06-18 LAB — URINALYSIS, ROUTINE W REFLEX MICROSCOPIC
Bilirubin Urine: NEGATIVE
Glucose, UA: NEGATIVE mg/dL
Hgb urine dipstick: NEGATIVE
Ketones, ur: NEGATIVE mg/dL
Nitrite: NEGATIVE
Protein, ur: NEGATIVE mg/dL
Specific Gravity, Urine: 1.02 (ref 1.005–1.030)
Urobilinogen, UA: 0.2 mg/dL (ref 0.0–1.0)
pH: 5.5 (ref 5.0–8.0)

## 2014-06-18 LAB — POC URINE PREG, ED: Preg Test, Ur: NEGATIVE

## 2014-06-18 MED ORDER — KETOROLAC TROMETHAMINE 60 MG/2ML IM SOLN
60.0000 mg | Freq: Once | INTRAMUSCULAR | Status: AC
  Administered 2014-06-18: 60 mg via INTRAMUSCULAR
  Filled 2014-06-18: qty 2

## 2014-06-18 MED ORDER — CYCLOBENZAPRINE HCL 5 MG PO TABS: 5.0000 mg | ORAL_TABLET | Freq: Three times a day (TID) | ORAL | Status: DC | PRN

## 2014-06-18 NOTE — ED Notes (Signed)
Patient is not in the room yet.

## 2014-06-18 NOTE — ED Notes (Signed)
The pt is c/o lower back and abd pain for 1-2 weeks.  She was seen by her doctor yesterday had a pelvic and no diagnosis was made.  Still having pain today  L;mp jan 3rd

## 2014-06-18 NOTE — ED Provider Notes (Signed)
CSN: [id number]     Arrival date & time 06/18/14  0630 History   First MD Initiated Contact with Patient 06/18/14 0701     Chief Complaint  Patient presents with  . Abdominal Pain     (Consider location/radiation/quality/duration/timing/severity/associated sxs/prior Treatment) Patient is a 34 y.o. female presenting with back pain.  Back Pain Location:  Lumbar spine and sacro-iliac joint (right sided) Quality:  Aching Radiates to: right groin, right leg. Pain severity:  Moderate Onset quality:  Gradual Duration:  7 days Timing:  Constant Progression:  Waxing and waning Chronicity:  New Context comment:  Saw her Gynecologist yesterday because she thought she was having an ovarian cyst.   Relieved by:  NSAIDs Worsened by:  Twisting and touching (certain movements) Associated symptoms: no abdominal pain, no dysuria, no fever and no weakness     Past Medical History  Diagnosis Date  . GERD (gastroesophageal reflux disease)   . Allergic rhinitis   . History of abnormal cervical Pap smear   . Heart murmur     MILD -  ASYMPTOMATIC  . Borderline hypertension   . History of ovarian cyst   . Wears glasses   . Breastfeeding (infant)    Past Surgical History  Procedure Laterality Date  . Diagnostic laparoscopy  2009  . Laparoscopic tubal ligation Bilateral 02/15/2014    Procedure: LAPAROSCOPIC TUBAL LIGATION WITH FILSHIE CLIPS;  Surgeon: Margarette Asal, MD;  Location: [facility];  Service: Gynecology;  Laterality: Bilateral;   Family History  Problem Relation Age of Onset  . Hypertension Mother   . Hypertension Maternal Aunt   . Diabetes Maternal Uncle   . Diabetes Maternal Grandmother   . Diabetes Paternal Grandmother   . Cancer Father     leaukemia   History  Substance Use Topics  . Smoking status: Never Smoker   . Smokeless tobacco: Never Used  . Alcohol Use: Yes     Comment: RARE   OB History    Gravida Para Term Preterm AB TAB SAB Ectopic  Multiple Living   6 6 6  0 0 0 0 0 0 6     Review of Systems  Constitutional: Negative for fever.  Gastrointestinal: Negative for abdominal pain.  Genitourinary: Negative for dysuria.  Musculoskeletal: Positive for back pain.  Neurological: Negative for weakness.  All other systems reviewed and are negative.     Allergies  Miconazole nitrate  Home Medications   Prior to Admission medications   Medication Sig Start Date End Date Taking? Authorizing Provider  HYDROcodone-ibuprofen (VICOPROFEN) 7.5-200 MG per tablet Take 1 tablet by mouth every 8 (eight) hours as needed for moderate pain. 02/15/14   Margarette Asal, MD  ibuprofen (ADVIL,MOTRIN) 600 MG tablet Take 1 tablet (600 mg total) by mouth every 6 (six) hours. 12/15/13   Damien Fusi, NP  Prenatal Vit-Fe Fumarate-FA (PRENATAL MULTIVITAMIN) TABS tablet Take 1 tablet by mouth daily at 12 noon.    Historical Provider, MD  ranitidine (ZANTAC) 150 MG tablet Take 150 mg by mouth as needed for heartburn.    Historical Provider, MD   BP 136/77 mmHg  Pulse 81  Temp(Src) 98.1 F (36.7 C) (Oral)  Resp 18  Ht 5\' 2"  (1.575 m)  Wt 173 lb (78.472 kg)  BMI 31.63 kg/m2  SpO2 97%  LMP 06/05/2014 Physical Exam  Constitutional: She is oriented to person, place, and time. She appears well-developed and well-nourished. No distress.  HENT:  Head: Normocephalic and atraumatic.  Eyes: Conjunctivae are normal. No scleral icterus.  Neck: Neck supple.  Cardiovascular: Normal rate and intact distal pulses.   Pulmonary/Chest: Effort normal. No stridor. No respiratory distress.  Abdominal: Normal appearance. She exhibits no distension. There is tenderness (minimal) in the right lower quadrant. There is no rigidity, no rebound and no guarding.  Musculoskeletal:       Back:  Neurological: She is alert and oriented to person, place, and time.  Skin: Skin is warm and dry. No rash noted.  Psychiatric: She has a normal mood and affect. Her behavior  is normal.  Nursing note and vitals reviewed.   ED Course  Procedures (including critical care time) Labs Review Labs Reviewed  URINALYSIS, ROUTINE W REFLEX MICROSCOPIC - Abnormal; Notable for the following:    Leukocytes, UA SMALL (*)    All other components within normal limits  URINE MICROSCOPIC-ADD ON  POC URINE PREG, ED    Imaging Review No results found.   EKG Interpretation None      MDM   Final diagnoses:  Right-sided low back pain without sciatica    34 yo female with right low back pain.  Saw her gynecologist yesterday and had pelvic exam, wet prep, ultrasound.  Per pt, ultrasound did not show ovarian cyst that she expected.  Pain seems more consistent with MSK back pain.  No hx of kidney stone, will check UA.  Also considered appendicitis, but presentation would be very atypical.  Has mild RLQ tenderness, but pain is located in back, no fevers, no nausea, and prolonged time course make appendicitis very unlikely.   Pain improved with Toradol.  UA negative for blood or infection.  Plan to treat as MSK back pain.  Advised follow up, given return precautions.     Houston Siren III, MD 06/18/14 [phone]

## 2014-06-18 NOTE — ED Notes (Signed)
Pt is stable upon d/c. Pt ambulates independently from ED. Pt verbalizes understanding rt d/c medications and instructions.

## 2014-06-18 NOTE — Discharge Instructions (Signed)

## 2014-09-17 ENCOUNTER — Emergency Department (HOSPITAL_BASED_OUTPATIENT_CLINIC_OR_DEPARTMENT_OTHER): Payer: Managed Care, Other (non HMO)

## 2014-09-17 ENCOUNTER — Encounter (HOSPITAL_BASED_OUTPATIENT_CLINIC_OR_DEPARTMENT_OTHER): Payer: Self-pay | Admitting: *Deleted

## 2014-09-17 ENCOUNTER — Emergency Department (HOSPITAL_BASED_OUTPATIENT_CLINIC_OR_DEPARTMENT_OTHER)
Admission: EM | Admit: 2014-09-17 | Discharge: 2014-09-17 | Disposition: A | Discharge status: Home / Self Care | Payer: Managed Care, Other (non HMO) | Attending: Emergency Medicine | Admitting: Emergency Medicine

## 2014-09-17 DIAGNOSIS — Z79899 Other long term (current) drug therapy: Secondary | ICD-10-CM | POA: Insufficient documentation

## 2014-09-17 DIAGNOSIS — Z8742 Personal history of other diseases of the female genital tract: Secondary | ICD-10-CM | POA: Insufficient documentation

## 2014-09-17 DIAGNOSIS — R079 Chest pain, unspecified: Secondary | ICD-10-CM | POA: Diagnosis not present

## 2014-09-17 DIAGNOSIS — K219 Gastro-esophageal reflux disease without esophagitis: Secondary | ICD-10-CM | POA: Diagnosis not present

## 2014-09-17 DIAGNOSIS — Z8709 Personal history of other diseases of the respiratory system: Secondary | ICD-10-CM | POA: Diagnosis not present

## 2014-09-17 DIAGNOSIS — Z8719 Personal history of other diseases of the digestive system: Secondary | ICD-10-CM | POA: Diagnosis not present

## 2014-09-17 LAB — CBC
HEMATOCRIT: 39.6 % (ref 36.0–46.0)
HEMOGLOBIN: 13.3 g/dL (ref 12.0–15.0)
MCH: 28.4 pg (ref 26.0–34.0)
MCHC: 33.6 g/dL (ref 30.0–36.0)
MCV: 84.4 fL (ref 78.0–100.0)
Platelets: 280 10*3/uL (ref 150–400)
RBC: 4.69 MIL/uL (ref 3.87–5.11)
RDW: 13.9 % (ref 11.5–15.5)
WBC: 7 10*3/uL (ref 4.0–10.5)

## 2014-09-17 LAB — BASIC METABOLIC PANEL
ANION GAP: 8 (ref 5–15)
BUN: 8 mg/dL (ref 6–23)
CO2: 26 mmol/L (ref 19–32)
CREATININE: 0.86 mg/dL (ref 0.50–1.10)
Calcium: 8.9 mg/dL (ref 8.4–10.5)
Chloride: 102 mmol/L (ref 96–112)
GFR calc Af Amer: >90 mL/min (ref >90)
GFR calc non Af Amer: 88 mL/min — ABNORMAL LOW (ref >90)
Glucose, Bld: 103 mg/dL — ABNORMAL HIGH (ref 70–99)
Potassium: 3.5 mmol/L (ref 3.5–5.1)
Sodium: 136 mmol/L (ref 135–145)

## 2014-09-17 LAB — TROPONIN I: Troponin I: <0.03 ng/mL (ref <0.031)

## 2014-09-17 MED ORDER — NITROGLYCERIN 0.4 MG SL SUBL
0.4000 mg | SUBLINGUAL_TABLET | SUBLINGUAL | Status: DC | PRN
  Administered 2014-09-17: 0.4 mg via SUBLINGUAL
  Filled 2014-09-17: qty 1

## 2014-09-17 MED ORDER — PANTOPRAZOLE SODIUM 40 MG IV SOLR
40.0000 mg | INTRAVENOUS | Status: AC
  Administered 2014-09-17: 40 mg via INTRAVENOUS
  Filled 2014-09-17: qty 40

## 2014-09-17 NOTE — ED Notes (Signed)
PA at bedside.

## 2014-09-17 NOTE — Discharge Instructions (Signed)
Follow up with your doctor for further evaluation of your chest pain. Refer to attached documents for more information.

## 2014-09-17 NOTE — ED Notes (Signed)
Repeat Troponin I repeated per EDP orders

## 2014-09-17 NOTE — ED Notes (Signed)
Pt states chest heaviness has improved, "I feel I can breathe better" , cont on cardiac monitor w/ NSR noted

## 2014-09-17 NOTE — ED Provider Notes (Signed)
CSN: [id number]     Arrival date & time 09/17/14  1344 History   First MD Initiated Contact with Patient 09/17/14 1429     Chief Complaint  Patient presents with  . Chest Pain     (Consider location/radiation/quality/duration/timing/severity/associated sxs/prior Treatment) Patient is a 34 y.o. female presenting with chest pain. The history is provided by the patient. No language interpreter was used.  Chest Pain Pain location:  L chest Pain quality: pressure   Pain quality comment:  Heaviness Pain radiates to:  Does not radiate Pain radiates to the back: no   Pain severity:  Severe Onset quality:  Sudden Duration:  1 day Timing:  Intermittent Progression:  Waxing and waning Chronicity:  New Context: not breathing, not eating, no intercourse, not lifting, no movement, not raising an arm, not at rest, no stress and no trauma   Relieved by:  Nothing Worsened by:  Nothing tried Ineffective treatments:  Rest and antacids Associated symptoms: no abdominal pain, no cough, no dizziness, no dysphagia, no fatigue, no fever, no nausea, no palpitations, no shortness of breath, not vomiting and no weakness   Risk factors: no aortic disease, no birth control, no coronary artery disease, no diabetes mellitus, no Ehlers-Danlos syndrome, no high cholesterol, no hypertension, no immobilization, not female, no Marfan's syndrome, not obese, not pregnant, no prior DVT/PE, no smoking and no surgery     Past Medical History  Diagnosis Date  . GERD (gastroesophageal reflux disease)   . Allergic rhinitis   . History of abnormal cervical Pap smear   . Heart murmur     MILD -  ASYMPTOMATIC  . Borderline hypertension   . History of ovarian cyst   . Wears glasses   . Breastfeeding (infant)    Past Surgical History  Procedure Laterality Date  . Diagnostic laparoscopy  2009  . Laparoscopic tubal ligation Bilateral 02/15/2014    Procedure: LAPAROSCOPIC TUBAL LIGATION WITH FILSHIE CLIPS;  Surgeon: Margarette Asal, MD;  Location: [facility];  Service: Gynecology;  Laterality: Bilateral;   Family History  Problem Relation Age of Onset  . Hypertension Mother   . Hypertension Maternal Aunt   . Diabetes Maternal Uncle   . Diabetes Maternal Grandmother   . Diabetes Paternal Grandmother   . Cancer Father     leaukemia   History  Substance Use Topics  . Smoking status: Never Smoker   . Smokeless tobacco: Never Used  . Alcohol Use: Yes     Comment: RARE   OB History    Gravida Para Term Preterm AB TAB SAB Ectopic Multiple Living   6 6 6  0 0 0 0 0 0 6     Review of Systems  Constitutional: Negative for fever, chills and fatigue.  HENT: Negative for trouble swallowing.   Eyes: Negative for visual disturbance.  Respiratory: Negative for cough and shortness of breath.   Cardiovascular: Positive for chest pain. Negative for palpitations.  Gastrointestinal: Negative for nausea, vomiting, abdominal pain and diarrhea.  Genitourinary: Negative for dysuria and difficulty urinating.  Musculoskeletal: Negative for arthralgias and neck pain.  Skin: Negative for color change.  Neurological: Negative for dizziness and weakness.  Psychiatric/Behavioral: Negative for dysphoric mood.      Allergies  Miconazole nitrate  Home Medications   Prior to Admission medications   Medication Sig Start Date End Date Taking? Authorizing Provider  ibuprofen (ADVIL,MOTRIN) 600 MG tablet Take 1 tablet (600 mg total) by mouth every 6 (six) hours. 12/15/13  Yes Juanda Chance, NP  Aspirin-Acetaminophen-Caffeine (GOODY HEADACHE PO) Take 1 each by mouth once.    Historical Provider, MD  cyclobenzaprine (FLEXERIL) 5 MG tablet Take 1 tablet (5 mg total) by mouth 3 (three) times daily as needed for muscle spasms. 06/18/14   Serita Grit, MD  HYDROcodone-ibuprofen (VICOPROFEN) 7.5-200 MG per tablet Take 1 tablet by mouth every 8 (eight) hours as needed for moderate pain. Patient not taking: Reported on  06/18/2014 02/15/14   Molli Posey, MD  phenylephrine (SUDAFED PE) 10 MG TABS tablet Take 20 mg by mouth every 4 (four) hours as needed.    Historical Provider, MD   BP 128/80 mmHg  Pulse 72  Temp(Src) 98.1 F (36.7 C) (Oral)  Resp 15  Ht 5' 2.5" (1.588 m)  Wt 174 lb (78.926 kg)  BMI 31.30 kg/m2  SpO2 100%  LMP 09/05/2014  Breastfeeding? No Physical Exam  Constitutional: She is oriented to person, place, and time. She appears well-developed and well-nourished. No distress.  HENT:  Head: Normocephalic and atraumatic.  Eyes: Conjunctivae and EOM are normal.  Neck: Normal range of motion.  Cardiovascular: Normal rate and regular rhythm.  Exam reveals no gallop and no friction rub.   No murmur heard. Pulmonary/Chest: Effort normal and breath sounds normal. She has no wheezes. She has no rales. She exhibits no tenderness.  Abdominal: Soft. She exhibits no distension. There is no tenderness. There is no rebound.  Musculoskeletal: Normal range of motion.  Neurological: She is alert and oriented to person, place, and time. Coordination normal.  Speech is goal-oriented. Moves limbs without ataxia.   Skin: Skin is warm and dry.  Psychiatric: She has a normal mood and affect. Her behavior is normal.  Nursing note and vitals reviewed.   ED Course  Procedures (including critical care time) Labs Review Labs Reviewed  BASIC METABOLIC PANEL - Abnormal; Notable for the following:    Glucose, Bld 103 (*)    GFR calc non Af Amer 88 (*)    All other components within normal limits  CBC  TROPONIN I  TROPONIN I    Imaging Review Dg Chest Port 1 View  09/17/2014   CLINICAL DATA:  Central chest pressure radiating to the left since yesterday. Normal EKG elsewhere this morning.  EXAM: PORTABLE CHEST - 1 VIEW  COMPARISON:  05/17/2011.  FINDINGS: Normal sized heart. Clear lungs with interval mildly prominent vasculature. Mild diffuse peribronchial thickening with mild progression. No pleural  fluid. Minimal scoliosis.  IMPRESSION: 1. Interval mild pulmonary vascular congestion. 2. Mild bronchitic changes with mild progression.   Electronically Signed   By: Claudie Revering M.D.   On: 09/17/2014 14:39     EKG Interpretation   Date/Time:  Saturday September 17 2014 13:53:47 EDT Ventricular Rate:  77 PR Interval:  152 QRS Duration: 84 QT Interval:  392 QTC Calculation: 443 R Axis:   65 Text Interpretation:  Normal sinus rhythm with sinus arrhythmia Normal ECG  Confirmed by DOCHERTY  MD, MEGAN ([id number]) on 09/17/2014 1:57:15 PM      MDM   Final diagnoses:  Chest pain, unspecified chest pain type   3:29 PM EKG, chest xray, labs and troponin unremarkable for acute changes. Patient will have delta troponin and be discharged if negative. Patient is PERC negative.   5:48 PM Delta troponin negative. Patient will be discharged with PCP follow up. Patient instructed to return with worsening or concerning symptoms.   Alvina Chou, PA-C 09/17/14 1749  Ernestina Patches, MD 09/18/14  0924 

## 2014-09-17 NOTE — ED Notes (Signed)
Placed on cardiac monitor, NSR noted, denies any radiation of chest "heaviness"

## 2014-09-17 NOTE — ED Notes (Signed)
Left chest pain since yesterday- evaluated at [facility] this morning and had normal EKG- sent here for further eval and labwork

## 2014-09-17 NOTE — ED Notes (Signed)
Presents to ED , sent from Urgent Care at [facility] site, c/o pain "heaviness" at Left upper portion of chest, pt states has acid reflux, onset per pt: yesterday PM

## 2014-12-02 ENCOUNTER — Ambulatory Visit (INDEPENDENT_AMBULATORY_CARE_PROVIDER_SITE_OTHER): Payer: Managed Care, Other (non HMO) | Admitting: Family Medicine

## 2014-12-02 ENCOUNTER — Encounter: Payer: Self-pay | Admitting: Family Medicine

## 2014-12-02 VITALS — BP 138/85 | HR 90 | Temp 98.6°F | Ht 62.5 in | Wt 175.0 lb

## 2014-12-02 DIAGNOSIS — N6001 Solitary cyst of right breast: Secondary | ICD-10-CM | POA: Diagnosis not present

## 2014-12-02 NOTE — Progress Notes (Signed)
Patient ID: Jenna Mcclure, female   DOB: 1980-07-05, 34 y.o.   MRN: [id number]   [facility] Aquilla Hacker, MD Phone: [phone]  Subjective:   # Right arm cysts / burning / itching - Pt. Here complaining of 6 months of itchiness and "burning" under her right and left arms.  - She relates this to "cysts" that were found on ultrasound of her breast some time ago in the obstetrician's office.  - She has not had any lumps in her breast.   - Breast character changes - weight loss, SOB, night sweats.  - She has a history of breast cancer in her family on her mom's side.  - She is very worried that this might be breast cancer or lymphoma.  - She has also not changed deodorant recently.  - She has been using Dove deodorant for some time.   All relevant systems were reviewed and were negative unless otherwise noted in the HPI  Past Medical History Reviewed problem list.  Medications- reviewed and updated Current Outpatient Prescriptions  Medication Sig Dispense Refill  . Aspirin-Acetaminophen-Caffeine (GOODY HEADACHE PO) Take 1 each by mouth once.    . cyclobenzaprine (FLEXERIL) 5 MG tablet Take 1 tablet (5 mg total) by mouth 3 (three) times daily as needed for muscle spasms. 15 tablet 0  . HYDROcodone-ibuprofen (VICOPROFEN) 7.5-200 MG per tablet Take 1 tablet by mouth every 8 (eight) hours as needed for moderate pain. (Patient not taking: Reported on 06/18/2014) 30 tablet 0  . ibuprofen (ADVIL,MOTRIN) 600 MG tablet Take 1 tablet (600 mg total) by mouth every 6 (six) hours. 30 tablet 1  . phenylephrine (SUDAFED PE) 10 MG TABS tablet Take 20 mg by mouth every 4 (four) hours as needed.    . [DISCONTINUED] DULoxetine (CYMBALTA) 20 MG capsule Take 20 mg by mouth daily.    . [DISCONTINUED] Norgestim-Eth Radene Journey Triphasic (ORTHO TRI-CYCLEN LO PO) Take 1 tablet by mouth daily.     No current facility-administered medications for this visit.   Chief complaint-noted No  additions to family history Social history- patient is a non smoker  Objective: BP 138/85 mmHg  Pulse 90  Temp(Src) 98.6 F (37 C) (Oral)  Ht 5' 2.5" (1.588 m)  Wt 175 lb (79.379 kg)  BMI 31.48 kg/m2  LMP 11/04/2014 Gen: NAD, alert, cooperative with exam HEENT: NCAT, EOMI, PERRL Neck: FROM, supple CV: RRR, good S1/S2, no murmur Resp: CTABL, no wheezes, non-labored Abd: SNTND, BS present, no guarding or organomegaly Ext: No edema, warm, normal tone, moves UE/LE spontaneously, Axilla without any palpable masses, nodes, or lesions noted. Slight fibroadenomas noted in upper outer quadrant of right breast. Freely mobile.  Neuro: Alert and oriented, No gross deficits Skin: no rashes no lesions  Assessment/Plan:  # Concern for Breast Lesions - Review of chart reveals negative Mammography 2 years ago, negative left breast ultrasound 2 years ago. Normal CBC, BMP in 09/2014. Exam overall reassuring. Pt. Still very anxious due to family history.  - Pt. Reassured, but still anxious.  - Advised that she can certainly self-refer and get a repeat mammogram, but overall I would not recommend any further workup.  - Will continue to follow as needed.

## 2014-12-02 NOTE — Patient Instructions (Signed)
Thanks for coming in today.   You don't have cysts in your arm pit, but may have some in the outside quadrant of your breast. These do not feel as though they are cancerous in origin, but it is not unreasonable to consider repeating the mammogram.   You can call to make the appointment for mammogram if you would like.   Normally, we would not start screening mammograms until 40. If this one is negative, then I would wait until you are 40.   Otherwise, we will see you back as needed.   Thanks for letting us take care of you!  Sincerely,  Paula Compton, MD Family Medicine - PGY 2

## 2014-12-07 ENCOUNTER — Ambulatory Visit: Payer: Managed Care, Other (non HMO) | Admitting: Internal Medicine

## 2014-12-13 ENCOUNTER — Ambulatory Visit: Payer: Managed Care, Other (non HMO) | Admitting: Internal Medicine

## 2015-01-12 ENCOUNTER — Telehealth: Payer: Self-pay | Admitting: Obstetrics and Gynecology

## 2015-01-12 NOTE — Telephone Encounter (Signed)
Will forward to PCP for review. Chase Knebel, CMA. 

## 2015-01-12 NOTE — Telephone Encounter (Signed)
Pt called and needs a referral to go to the [facility]. jw

## 2015-01-13 NOTE — Telephone Encounter (Signed)
Per prior clinic note(not seen by me) patient was not going to get a referral for further breast imaging. She could self-refer or schedule without a doctor. All findings prior were benign. If patient wants to come in and discuss with me she can but I will not refer at this time. Luiz Blare, DO

## 2015-01-16 NOTE — Telephone Encounter (Signed)
Advised pt as directed below and she stated that its too much for her to take days off from work since she was seen for this on 12/02/14. She stated that she tried to schedule an appt with the [facility] but was told we had to fax an order over?? I told her the reason why you may want to see her is because you did not see her for this and she was seen by someone else about it. She really would prefer not to have to come in as she has a lot going on right now and taking off days from work may be a problem. Please advise. Eldrick Penick, CMA.

## 2015-01-18 NOTE — Telephone Encounter (Signed)
Called patient to discuss her concerns. Advised patient that she needs to schedule an appointment with me so I can evaluate her. A referral would not be possible at this time as last provider did not recommend it and I have not evaluated patient myself. When she called breast center they said it was not indicated due to her age. I discussed that evaluation and referrals will be based off my H&P when I see her. Patient has [facility] of breast cancer so she is concerned. Patient to schedule appointment. Nothing further needed.

## 2015-02-07 ENCOUNTER — Other Ambulatory Visit: Payer: Self-pay

## 2015-02-08 ENCOUNTER — Other Ambulatory Visit: Payer: Self-pay | Admitting: Nurse Practitioner

## 2015-02-08 DIAGNOSIS — N6001 Solitary cyst of right breast: Secondary | ICD-10-CM

## 2015-02-08 LAB — CYTOLOGY - PAP

## 2015-02-10 ENCOUNTER — Ambulatory Visit
Admission: RE | Admit: 2015-02-10 | Discharge: 2015-02-10 | Disposition: A | Discharge status: Home / Self Care | Payer: Managed Care, Other (non HMO) | Source: Ambulatory Visit | Attending: Nurse Practitioner | Admitting: Nurse Practitioner

## 2015-02-10 ENCOUNTER — Other Ambulatory Visit: Payer: Self-pay | Admitting: Nurse Practitioner

## 2015-02-10 DIAGNOSIS — N6001 Solitary cyst of right breast: Secondary | ICD-10-CM

## 2015-05-21 ENCOUNTER — Emergency Department (HOSPITAL_COMMUNITY): Payer: Managed Care, Other (non HMO)

## 2015-05-21 ENCOUNTER — Encounter (HOSPITAL_COMMUNITY): Payer: Self-pay | Admitting: Emergency Medicine

## 2015-05-21 ENCOUNTER — Emergency Department (HOSPITAL_COMMUNITY)
Admission: EM | Admit: 2015-05-21 | Discharge: 2015-05-21 | Disposition: A | Discharge status: Home / Self Care | Payer: Managed Care, Other (non HMO) | Attending: Emergency Medicine | Admitting: Emergency Medicine

## 2015-05-21 DIAGNOSIS — Z3202 Encounter for pregnancy test, result negative: Secondary | ICD-10-CM | POA: Insufficient documentation

## 2015-05-21 DIAGNOSIS — K219 Gastro-esophageal reflux disease without esophagitis: Secondary | ICD-10-CM | POA: Diagnosis not present

## 2015-05-21 DIAGNOSIS — M791 Myalgia, unspecified site: Secondary | ICD-10-CM

## 2015-05-21 DIAGNOSIS — R109 Unspecified abdominal pain: Secondary | ICD-10-CM | POA: Diagnosis present

## 2015-05-21 DIAGNOSIS — Z79899 Other long term (current) drug therapy: Secondary | ICD-10-CM | POA: Diagnosis not present

## 2015-05-21 DIAGNOSIS — R011 Cardiac murmur, unspecified: Secondary | ICD-10-CM | POA: Insufficient documentation

## 2015-05-21 DIAGNOSIS — Z8742 Personal history of other diseases of the female genital tract: Secondary | ICD-10-CM | POA: Insufficient documentation

## 2015-05-21 LAB — URINALYSIS, ROUTINE W REFLEX MICROSCOPIC
Bilirubin Urine: NEGATIVE
Glucose, UA: NEGATIVE mg/dL
HGB URINE DIPSTICK: NEGATIVE
Ketones, ur: NEGATIVE mg/dL
LEUKOCYTES UA: NEGATIVE
NITRITE: NEGATIVE
PROTEIN: NEGATIVE mg/dL
SPECIFIC GRAVITY, URINE: 1.025 (ref 1.005–1.030)
pH: 6 (ref 5.0–8.0)

## 2015-05-21 LAB — I-STAT BETA HCG BLOOD, ED (MC, WL, AP ONLY)

## 2015-05-21 MED ORDER — METHOCARBAMOL 500 MG PO TABS: 500.0000 mg | ORAL_TABLET | Freq: Two times a day (BID) | ORAL | Status: DC

## 2015-05-21 MED ORDER — NAPROXEN 500 MG PO TABS: 500.0000 mg | ORAL_TABLET | Freq: Two times a day (BID) | ORAL | Status: DC

## 2015-05-21 MED ORDER — HYDROCODONE-ACETAMINOPHEN 5-325 MG PO TABS: 2.0000 | ORAL_TABLET | ORAL | Status: DC | PRN

## 2015-05-21 NOTE — ED Notes (Signed)
Pt c/o right flank pain x 1 month, pt seen by GYN and was told everything was fine. Pt did not see her PMD. Pt reports some nausea.

## 2015-05-21 NOTE — Discharge Instructions (Signed)

## 2015-05-21 NOTE — ED Provider Notes (Signed)
CSN: [id number]     Arrival date & time 05/21/15  0708 History   First MD Initiated Contact with Patient 05/21/15 0755     Chief Complaint  Patient presents with  . Flank Pain      HPI  She presents dilation of right flank and right-sided abdominal pain. Symptoms ongoing for about 1 week. Has a history of some right thoracic pain and right shoulder pain rather chronically after an epidural and childbirth. This resolved after several months. States she was given a medication for it. Symptoms started again 1 week ago. Hurts to bend and twist moves or sits with the pain in her right back and flank. No urinary symptoms. No nausea vomiting. No intra-abdominal pain or vomiting.  Past Medical History  Diagnosis Date  . GERD (gastroesophageal reflux disease)   . Allergic rhinitis   . History of abnormal cervical Pap smear   . Heart murmur     MILD -  ASYMPTOMATIC  . Borderline hypertension   . History of ovarian cyst   . Wears glasses   . Breastfeeding (infant)    Past Surgical History  Procedure Laterality Date  . Diagnostic laparoscopy  2009  . Laparoscopic tubal ligation Bilateral 02/15/2014    Procedure: LAPAROSCOPIC TUBAL LIGATION WITH FILSHIE CLIPS;  Surgeon: Margarette Asal, MD;  Location: [facility];  Service: Gynecology;  Laterality: Bilateral;   Family History  Problem Relation Age of Onset  . Hypertension Mother   . Hypertension Maternal Aunt   . Diabetes Maternal Uncle   . Diabetes Maternal Grandmother   . Diabetes Paternal Grandmother   . Cancer Father     leaukemia   Social History  Substance Use Topics  . Smoking status: Never Smoker   . Smokeless tobacco: Never Used  . Alcohol Use: Yes     Comment: RARE   OB History    Gravida Para Term Preterm AB TAB SAB Ectopic Multiple Living   6 6 6  0 0 0 0 0 0 6     Review of Systems  Constitutional: Negative for fever, chills, diaphoresis, appetite change and fatigue.  HENT: Negative for mouth  sores, sore throat and trouble swallowing.   Eyes: Negative for visual disturbance.  Respiratory: Negative for cough, chest tightness, shortness of breath and wheezing.   Cardiovascular: Negative for chest pain.  Gastrointestinal: Negative for nausea, vomiting, abdominal pain, diarrhea and abdominal distention.  Endocrine: Negative for polydipsia, polyphagia and polyuria.  Genitourinary: Positive for flank pain. Negative for dysuria, frequency and hematuria.  Musculoskeletal: Negative for gait problem.  Skin: Negative for color change, pallor and rash.  Neurological: Negative for dizziness, syncope, light-headedness and headaches.  Hematological: Does not bruise/bleed easily.  Psychiatric/Behavioral: Negative for behavioral problems and confusion.      Allergies  Miconazole nitrate  Home Medications   Prior to Admission medications   Medication Sig Start Date End Date Taking? Authorizing Provider  Cholecalciferol (VITAMIN D) 2000 UNITS CAPS Take 2,000 Units by mouth daily.   Yes Historical Provider, MD  ibuprofen (ADVIL,MOTRIN) 200 MG tablet Take 600 mg by mouth every 6 (six) hours as needed for mild pain or moderate pain.   Yes Historical Provider, MD  pseudoephedrine-guaifenesin (MUCINEX D) 60-600 MG 12 hr tablet Take 1 tablet by mouth every 12 (twelve) hours as needed for congestion.   Yes Historical Provider, MD  Turmeric 500 MG CAPS Take 1,000 mg by mouth daily.   Yes Historical Provider, MD  cyclobenzaprine (FLEXERIL) 5  MG tablet Take 1 tablet (5 mg total) by mouth 3 (three) times daily as needed for muscle spasms. Patient not taking: Reported on 05/21/2015 06/18/14   Serita Grit, MD  HYDROcodone-acetaminophen (NORCO/VICODIN) 5-325 MG tablet Take 2 tablets by mouth every 4 (four) hours as needed. 05/21/15   Tanna Furry, MD  HYDROcodone-ibuprofen (VICOPROFEN) 7.5-200 MG per tablet Take 1 tablet by mouth every 8 (eight) hours as needed for moderate pain. Patient not taking: Reported  on 06/18/2014 02/15/14   Molli Posey, MD  ibuprofen (ADVIL,MOTRIN) 600 MG tablet Take 1 tablet (600 mg total) by mouth every 6 (six) hours. Patient not taking: Reported on 05/21/2015 12/15/13   Juanda Chance, NP  methocarbamol (ROBAXIN) 500 MG tablet Take 1 tablet (500 mg total) by mouth 2 (two) times daily. 05/21/15   Tanna Furry, MD  naproxen (NAPROSYN) 500 MG tablet Take 1 tablet (500 mg total) by mouth 2 (two) times daily. 05/21/15   Tanna Furry, MD   BP 113/78 mmHg  Pulse 75  Temp(Src) 98.2 F (36.8 C) (Oral)  Resp 18  Ht 5' 2.5" (1.588 m)  Wt 173 lb (78.472 kg)  BMI 31.12 kg/m2  SpO2 100%  LMP 05/10/2015  Breastfeeding? No Physical Exam  Constitutional: She is oriented to person, place, and time. She appears well-developed and well-nourished. No distress.  HENT:  Head: Normocephalic.  Eyes: Conjunctivae are normal. Pupils are equal, round, and reactive to light. No scleral icterus.  Neck: Normal range of motion. Neck supple. No thyromegaly present.  Cardiovascular: Normal rate and regular rhythm.  Exam reveals no gallop and no friction rub.   No murmur heard. Pulmonary/Chest: Effort normal and breath sounds normal. No respiratory distress. She has no wheezes. She has no rales.  Abdominal: Soft. Bowel sounds are normal. She exhibits no distension. There is no tenderness. There is no rebound.    Tenderness reproducible in the musculature of the right lateral abdominal wall and right flank. No intra-abdominal rebound or guarding or peritoneal irritation. No frank CVA area tenderness.  Musculoskeletal: Normal range of motion.  Neurological: She is alert and oriented to person, place, and time.  Skin: Skin is warm and dry. No rash noted.  Psychiatric: She has a normal mood and affect. Her behavior is normal.    ED Course  Procedures (including critical care time) Labs Review Labs Reviewed  URINALYSIS, ROUTINE W REFLEX MICROSCOPIC (NOT AT [facility])  I-STAT BETA HCG BLOOD, ED ([facility],  [facility], [facility] ONLY)    Imaging Review Ct Abdomen Pelvis Wo Contrast  05/21/2015  CLINICAL DATA:  Right flank pain for 1 month EXAM: CT ABDOMEN AND PELVIS WITHOUT CONTRAST TECHNIQUE: Multidetector CT imaging of the abdomen and pelvis was performed following the standard protocol without IV contrast. COMPARISON:  12/01/2010 FINDINGS: No evidence of hydronephrosis or nephrolithiasis. No ureteral calculus. Liver, gallbladder, spleen, pancreas, adrenal glands are within normal limits. Bladder, uterus, and adnexa are within normal limits. Normal appendix, marked by narrow. No vertebral compression deformity. IMPRESSION: No evidence of urinary obstruction. Electronically Signed   By: Marybelle Killings M.D.   On: 05/21/2015 08:34   I have personally reviewed and evaluated these images and lab results as part of my medical decision-making.   EKG Interpretation None      MDM   Final diagnoses:  Muscular pain    Normal urine. No findings on CT. I think her symptoms are muscular.  Plan is home, anti-inflammatories, pain medicine, muscle relaxants, physical therapy referral.  Follow-up if not improving.  Tanna Furry, MD 05/21/15 [phone]

## 2015-07-12 ENCOUNTER — Ambulatory Visit (INDEPENDENT_AMBULATORY_CARE_PROVIDER_SITE_OTHER): Payer: Managed Care, Other (non HMO) | Admitting: Physician Assistant

## 2015-07-12 VITALS — BP 118/82 | HR 89 | Temp 98.8°F | Resp 18 | Ht 62.5 in | Wt 169.6 lb

## 2015-07-12 DIAGNOSIS — J309 Allergic rhinitis, unspecified: Secondary | ICD-10-CM

## 2015-07-12 DIAGNOSIS — S0300XA Dislocation of jaw, unspecified side, initial encounter: Secondary | ICD-10-CM

## 2015-07-12 DIAGNOSIS — J329 Chronic sinusitis, unspecified: Secondary | ICD-10-CM | POA: Diagnosis not present

## 2015-07-12 DIAGNOSIS — R6884 Jaw pain: Secondary | ICD-10-CM | POA: Diagnosis not present

## 2015-07-12 MED ORDER — PSEUDOEPHEDRINE HCL 60 MG PO TABS: 60.0000 mg | ORAL_TABLET | Freq: Four times a day (QID) | ORAL | Status: DC | PRN

## 2015-07-12 MED ORDER — TRIAMCINOLONE ACETONIDE 55 MCG/ACT NA AERO: 2.0000 | INHALATION_SPRAY | Freq: Every day | NASAL | Status: DC

## 2015-07-12 MED ORDER — CETIRIZINE HCL 10 MG PO TABS: 10.0000 mg | ORAL_TABLET | Freq: Every day | ORAL | Status: DC

## 2015-07-12 NOTE — Patient Instructions (Signed)
Await contact for referral for ENT appointment. I would like you to continue hydrating with 64 oz of water per day. For now, please follow instructions on TMJ prevention--you can try doing the muscle relaxant as 3 times per day.  And you can do tylenol for the tmj. We will treat the allergies with zyrtec, and nasacort.  Please let me know if you have increased congestion, pressure, thick mucus, fever, etc.     Allergic Rhinitis Allergic rhinitis is when the mucous membranes in the nose respond to allergens. Allergens are particles in the air that cause your body to have an allergic reaction. This causes you to release allergic antibodies. Through a chain of events, these eventually cause you to release histamine into the blood stream. Although meant to protect the body, it is this release of histamine that causes your discomfort, such as frequent sneezing, congestion, and an itchy, runny nose.  CAUSES Seasonal allergic rhinitis (hay fever) is caused by pollen allergens that may come from grasses, trees, and weeds. Year-round allergic rhinitis (perennial allergic rhinitis) is caused by allergens such as house dust mites, pet dander, and mold spores. SYMPTOMS  Nasal stuffiness (congestion).  Itchy, runny nose with sneezing and tearing of the eyes. DIAGNOSIS Your health care provider can help you determine the allergen or allergens that trigger your symptoms. If you and your health care provider are unable to determine the allergen, skin or blood testing may be used. Your health care provider will diagnose your condition after taking your health history and performing a physical exam. Your health care provider may assess you for other related conditions, such as asthma, pink eye, or an ear infection. TREATMENT Allergic rhinitis does not have a cure, but it can be controlled by:  Medicines that block allergy symptoms. These may include allergy shots, nasal sprays, and oral antihistamines.  Avoiding  the allergen. Hay fever may often be treated with antihistamines in pill or nasal spray forms. Antihistamines block the effects of histamine. There are over-the-counter medicines that may help with nasal congestion and swelling around the eyes. Check with your health care provider before taking or giving this medicine. If avoiding the allergen or the medicine prescribed do not work, there are many new medicines your health care provider can prescribe. Stronger medicine may be used if initial measures are ineffective. Desensitizing injections can be used if medicine and avoidance does not work. Desensitization is when a patient is given ongoing shots until the body becomes less sensitive to the allergen. Make sure you follow up with your health care provider if problems continue. HOME CARE INSTRUCTIONS It is not possible to completely avoid allergens, but you can reduce your symptoms by taking steps to limit your exposure to them. It helps to know exactly what you are allergic to so that you can avoid your specific triggers. SEEK MEDICAL CARE IF:  You have a fever.  You develop a cough that does not stop easily (persistent).  You have shortness of breath.  You start wheezing.  Symptoms interfere with normal daily activities.   This information is not intended to replace advice given to you by your health care provider. Make sure you discuss any questions you have with your health care provider.   Document Released: 02/12/2001 Document Revised: 06/10/2014 Document Reviewed: 01/25/2013 Elsevier Interactive Patient Education 2016 [employer].  Temporomandibular Joint Syndrome Temporomandibular joint (TMJ) syndrome is a condition that affects the joints between your jaw and your skull. The TMJs are located near  your ears and allow your jaw to open and close. These joints and the nearby muscles are involved in all movements of the jaw. People with TMJ syndrome have pain in the area of these joints  and muscles. Chewing, biting, or other movements of the jaw can be difficult or painful. TMJ syndrome can be caused by various things. In many cases, the condition is mild and goes away within a few weeks. For some people, the condition can become a long-term problem. CAUSES Possible causes of TMJ syndrome include:  Grinding your teeth or clenching your jaw. Some people do this when they are under stress.  Arthritis.  Injury to the jaw.  Head or neck injury.  Teeth or dentures that are not aligned well. In some cases, the cause of TMJ syndrome may not be known. SIGNS AND SYMPTOMS The most common symptom is an aching pain on the side of the head in the area of the TMJ. Other symptoms may include:  Pain when moving your jaw, such as when chewing or biting.  Being unable to open your jaw all the way.  Making a clicking sound when you open your mouth.  Headache.  Earache.  Neck or shoulder pain. DIAGNOSIS Diagnosis can usually be made based on your symptoms, your medical history, and a physical exam. Your health care provider may check the range of motion of your jaw. Imaging tests, such as X-rays or an MRI, are sometimes done. You may need to see your dentist to determine if your teeth and jaw are lined up correctly. TREATMENT TMJ syndrome often goes away on its own. If treatment is needed, the options may include:  Eating soft foods and applying ice or heat.  Medicines to relieve pain or inflammation.  Medicines to relax the muscles.  A splint, bite plate, or mouthpiece to prevent teeth grinding or jaw clenching.  Relaxation techniques or counseling to help reduce stress.  Transcutaneous electrical nerve stimulation (TENS). This helps to relieve pain by applying an electrical current through the skin.  Acupuncture. This is sometimes helpful to relieve pain.  Jaw surgery. This is rarely needed. HOME CARE INSTRUCTIONS  Take medicines only as directed by your health care  provider.  Eat a soft diet if you are having trouble chewing.  Apply ice to the painful area.  Put ice in a plastic bag.  Place a towel between your skin and the bag.  Leave the ice on for 20 minutes, 2-3 times a day.  Apply a warm compress to the painful area as directed.  Massage your jaw area and perform any jaw stretching exercises as recommended by your health care provider.  If you were given a mouthpiece or bite plate, wear it as directed.  Avoid foods that require a lot of chewing. Do not chew gum.  Keep all follow-up visits as directed by your health care provider. This is important. SEEK MEDICAL CARE IF:  You are having trouble eating.  You have new or worsening symptoms. SEEK IMMEDIATE MEDICAL CARE IF:  Your jaw locks open or closed.   This information is not intended to replace advice given to you by your health care provider. Make sure you discuss any questions you have with your health care provider.   Document Released: 02/12/2001 Document Revised: 06/10/2014 Document Reviewed: 12/23/2013 Elsevier Interactive Patient Education [employer].

## 2015-07-12 NOTE — Progress Notes (Signed)
[facility] [address] [place] [address] [phone]  Date:  07/12/2015   Name:  Jenna Mcclure   DOB:  07/30/1980   MRN:  [id number]  PCP:  Luiz Blare, DO   Chief Complaint  Patient presents with  . sinus pressure    over the past couple of days   History of Present Illness:  LASHENA Mcclure is a 35 y.o. female patient who presents to [facility]    The last 2 weeks she has been having intense sinus pressure that radiates along her cheek to her right jaw.  She breathes through her nose fine.  No fever.  She is not coughing.  She has no runny nose.  She has right otalgia.  No drainage from her ear.  No hearing loss.  No nose bleeds.   She has been taking zyrtec or mucinex d.   She took 2 hydrocodones, which did not help much this morning.  She took zyrtec D.    Patient Active Problem List   Diagnosis Date Noted  . Pregnancy 12/13/2013  . Indication for care in labor or delivery 12/12/2013  . Conjunctivitis 07/13/2013  . Other social stressor 11/26/2012  . Vaginitis 11/26/2012  . Breast pain, left 10/09/2012  . Abdominal pain, left lower quadrant 10/09/2012  . Jaw pain 09/07/2012  . Sinusitis 06/11/2011  . GERD (gastroesophageal reflux disease) 03/19/2011  . Chronic pain syndrome 02/12/2011  . ESSENTIAL HYPERTENSION, BENIGN 07/20/2009  . DEPRESSION, ACUTE 07/20/2008    Past Medical History  Diagnosis Date  . GERD (gastroesophageal reflux disease)   . Allergic rhinitis   . History of abnormal cervical Pap smear   . Heart murmur     MILD -  ASYMPTOMATIC  . Borderline hypertension   . History of ovarian cyst   . Wears glasses   . Breastfeeding (infant)     Past Surgical History  Procedure Laterality Date  . Diagnostic laparoscopy  2009  . Laparoscopic tubal ligation Bilateral 02/15/2014    Procedure: LAPAROSCOPIC TUBAL LIGATION WITH FILSHIE CLIPS;  Surgeon: Margarette Asal, MD;  Location: [facility];  Service: Gynecology;   Laterality: Bilateral;  . Tubal ligation      Social History  Substance Use Topics  . Smoking status: Never Smoker   . Smokeless tobacco: Never Used  . Alcohol Use: Yes     Comment: RARE    Family History  Problem Relation Age of Onset  . Hypertension Mother   . Cancer Mother   . Hypertension Maternal Aunt   . Diabetes Maternal Uncle   . Diabetes Maternal Grandmother   . Diabetes Paternal Grandmother   . Cancer Father     leaukemia    Allergies  Allergen Reactions  . Miconazole Nitrate Hives, Itching and Rash    Medication list has been reviewed and updated.  Current Outpatient Prescriptions on File Prior to Visit  Medication Sig Dispense Refill  . Cholecalciferol (VITAMIN D) 2000 UNITS CAPS Take 2,000 Units by mouth daily.    Marland Kitchen HYDROcodone-acetaminophen (NORCO/VICODIN) 5-325 MG tablet Take 2 tablets by mouth every 4 (four) hours as needed. 10 tablet 0  . cyclobenzaprine (FLEXERIL) 5 MG tablet Take 1 tablet (5 mg total) by mouth 3 (three) times daily as needed for muscle spasms. (Patient not taking: Reported on 05/21/2015) 15 tablet 0  . HYDROcodone-ibuprofen (VICOPROFEN) 7.5-200 MG per tablet Take 1 tablet by mouth every 8 (eight) hours as needed for moderate pain. (  Patient not taking: Reported on 06/18/2014) 30 tablet 0  . ibuprofen (ADVIL,MOTRIN) 200 MG tablet Take 600 mg by mouth every 6 (six) hours as needed for mild pain or moderate pain. Reported on 07/12/2015    . ibuprofen (ADVIL,MOTRIN) 600 MG tablet Take 1 tablet (600 mg total) by mouth every 6 (six) hours. (Patient not taking: Reported on 05/21/2015) 30 tablet 1  . methocarbamol (ROBAXIN) 500 MG tablet Take 1 tablet (500 mg total) by mouth 2 (two) times daily. (Patient not taking: Reported on 07/12/2015) 20 tablet 0  . naproxen (NAPROSYN) 500 MG tablet Take 1 tablet (500 mg total) by mouth 2 (two) times daily. (Patient not taking: Reported on 07/12/2015) 30 tablet 0  . pseudoephedrine-guaifenesin (MUCINEX D) 60-600 MG 12  hr tablet Take 1 tablet by mouth every 12 (twelve) hours as needed for congestion. Reported on 07/12/2015    . Turmeric 500 MG CAPS Take 1,000 mg by mouth daily. Reported on 07/12/2015    . [DISCONTINUED] DULoxetine (CYMBALTA) 20 MG capsule Take 20 mg by mouth daily.    . [DISCONTINUED] Norgestim-Eth Radene Journey Triphasic (ORTHO TRI-CYCLEN LO PO) Take 1 tablet by mouth daily.     No current facility-administered medications on file prior to visit.    ROS ROS otherwise unremarkable unless listed above.   Physical Examination: BP 118/82 mmHg  Pulse 89  Temp(Src) 98.8 F (37.1 C) (Oral)  Resp 18  Ht 5' 2.5" (1.588 m)  Wt 169 lb 9.6 oz (76.93 kg)  BMI 30.51 kg/m2  SpO2 98%  LMP 06/19/2015 Ideal Body Weight: Weight in (lb) to have BMI = 25: 138.6  Physical Exam  Constitutional: She is oriented to person, place, and time. She appears well-developed and well-nourished. No distress.  HENT:  Head: Normocephalic and atraumatic.  Right Ear: Tympanic membrane, external ear and ear canal normal.  Left Ear: Tympanic membrane, external ear and ear canal normal.  Nose: Mucosal edema and rhinorrhea present. Right sinus exhibits maxillary sinus tenderness. Right sinus exhibits no frontal sinus tenderness. Left sinus exhibits no maxillary sinus tenderness and no frontal sinus tenderness.  Mouth/Throat: No uvula swelling. No oropharyngeal exudate, posterior oropharyngeal edema or posterior oropharyngeal erythema.  Pain at the tmj with movement of mouth though no clicking can be appreciated.  No oral or glandular swelling appreciated at this time.  Upper right gap where possible molar?? Would be.  No erythema, drainage or tenderness upon palpaltion.  Eyes: Conjunctivae and EOM are normal. Pupils are equal, round, and reactive to light.  Cardiovascular: Normal rate and regular rhythm.  Exam reveals no gallop, no distant heart sounds and no friction rub.   No murmur heard. Pulmonary/Chest: Effort normal. No  respiratory distress. She has no decreased breath sounds. She has no wheezes. She has no rhonchi.  Lymphadenopathy:       Head (right side): No submandibular, no tonsillar, no preauricular and no posterior auricular adenopathy present.       Head (left side): No submandibular, no tonsillar, no preauricular and no posterior auricular adenopathy present.  Neurological: She is alert and oriented to person, place, and time.  Skin: She is not diaphoretic.  Psychiatric: She has a normal mood and affect. Her behavior is normal.     Assessment and Plan: KAMARIANA CABRAL is a 35 y.o. female who is here today is here for sinus pain. -this appears to be secondary to her allergies, and undertreatment.  At this time I am placing her on cetirizine, and nasal spray.  We will do temporary sudafed.   -i have also advised her to use muscle relaxant and nsaid, and follow instruction to treat her tmj.  This may be referred pain, but it is difficult to tell. -i am placing a referral to ent at this time.  She may need CT of her sinuses, however ent consult is preferred and appreciated.    Recurrent rhinosinusitis - Plan: Ambulatory referral to ENT, pseudoephedrine (SUDAFED) 60 MG tablet  Allergic rhinitis, unspecified allergic rhinitis type - Plan: cetirizine (ZYRTEC) 10 MG tablet, triamcinolone (NASACORT) 55 MCG/ACT AERO nasal inhaler, pseudoephedrine (SUDAFED) 60 MG tablet  TMJ (dislocation of temporomandibular joint), initial encounter - Plan: Ambulatory referral to ENT  Ivar Drape, PA-C [facility] 2/8/20179:35 AM

## 2015-09-03 ENCOUNTER — Ambulatory Visit (INDEPENDENT_AMBULATORY_CARE_PROVIDER_SITE_OTHER): Payer: Managed Care, Other (non HMO) | Admitting: Family Medicine

## 2015-09-03 VITALS — BP 110/78 | HR 84 | Temp 98.1°F | Resp 16 | Ht 62.5 in | Wt 172.2 lb

## 2015-09-03 DIAGNOSIS — K148 Other diseases of tongue: Secondary | ICD-10-CM | POA: Diagnosis not present

## 2015-09-03 NOTE — Patient Instructions (Addendum)
     IF you received an x-ray today, you will receive an invoice from [facility]. Please contact [facility] at [phone] with questions or concerns regarding your invoice.   IF you received labwork today, you will receive an invoice from [employer]. Please contact [facility] at [phone] with questions or concerns regarding your invoice.   Our billing staff will not be able to assist you with questions regarding bills from these companies.  You will be contacted with the lab results as soon as they are available. The fastest way to get your results is to activate your My Chart account. Instructions are located on the last page of this paperwork. If you have not heard from [place] regarding the results in 2 weeks, please contact this office.    The bumps on your tongue are called circumvallate papillae. These are normal anatomy, and I see nothing else of concern on exam. If you do have any pain in the back of the throat, new redness, or new blisters or bumps, return for recheck here or your primary care provider.

## 2015-09-03 NOTE — Progress Notes (Signed)
Subjective:    Patient ID: Jenna Mcclure, female    DOB: Jan 16, 1981, 35 y.o.   MRN: [id number] By signing my name below, I, Zola Button, attest that this documentation has been prepared under the direction and in the presence of Merri Ray, MD.  Electronically Signed: Zola Button, Medical Scribe. 09/03/2015. 12:01 PM.  HPI HPI Comments: Jenna Mcclure is a 35 y.o. female who presents to the [facility] complaining of red bumps to the back of her tongue that she first noticed this morning while brushing her teeth. The bumps are not painful. Patient notes she engaged in oral sex with her husband a few days ago. She denies history of HSV. Her husband does not have a history of HSV.    Patient Active Problem List   Diagnosis Date Noted  . Pregnancy 12/13/2013  . Indication for care in labor or delivery 12/12/2013  . Conjunctivitis 07/13/2013  . Other social stressor 11/26/2012  . Vaginitis 11/26/2012  . Breast pain, left 10/09/2012  . Abdominal pain, left lower quadrant 10/09/2012  . Jaw pain 09/07/2012  . Sinusitis 06/11/2011  . GERD (gastroesophageal reflux disease) 03/19/2011  . Chronic pain syndrome 02/12/2011  . ESSENTIAL HYPERTENSION, BENIGN 07/20/2009  . DEPRESSION, ACUTE 07/20/2008   Past Medical History  Diagnosis Date  . GERD (gastroesophageal reflux disease)   . Allergic rhinitis   . History of abnormal cervical Pap smear   . Heart murmur     MILD -  ASYMPTOMATIC  . Borderline hypertension   . History of ovarian cyst   . Wears glasses   . Breastfeeding (infant)    Past Surgical History  Procedure Laterality Date  . Diagnostic laparoscopy  2009  . Laparoscopic tubal ligation Bilateral 02/15/2014    Procedure: LAPAROSCOPIC TUBAL LIGATION WITH FILSHIE CLIPS;  Surgeon: Margarette Asal, MD;  Location: [facility];  Service: Gynecology;  Laterality: Bilateral;  . Tubal ligation     Allergies  Allergen Reactions  . Miconazole  Nitrate Hives, Itching and Rash   Prior to Admission medications   Medication Sig Start Date End Date Taking? Authorizing Provider  cetirizine (ZYRTEC) 10 MG tablet Take 1 tablet (10 mg total) by mouth daily. 07/12/15  Yes Dorian Heckle English, PA  Cholecalciferol (VITAMIN D) 2000 UNITS CAPS Take 2,000 Units by mouth daily.   Yes Historical Provider, MD  ibuprofen (ADVIL,MOTRIN) 200 MG tablet Take 600 mg by mouth every 6 (six) hours as needed for mild pain or moderate pain. Reported on 07/12/2015   Yes Historical Provider, MD  methocarbamol (ROBAXIN) 500 MG tablet Take 1 tablet (500 mg total) by mouth 2 (two) times daily. 05/21/15  Yes Tanna Furry, MD  naproxen (NAPROSYN) 500 MG tablet Take 1 tablet (500 mg total) by mouth 2 (two) times daily. 05/21/15  Yes Tanna Furry, MD  triamcinolone (NASACORT) 55 MCG/ACT AERO nasal inhaler Place 2 sprays into the nose daily. 07/12/15   Joretta Bachelor, PA   Social History   Social History  . Marital Status: Married    Spouse Name: N/A  . Number of Children: N/A  . Years of Education: N/A   Occupational History  . Not on file.   Social History Main Topics  . Smoking status: Never Smoker   . Smokeless tobacco: Never Used  . Alcohol Use: Yes     Comment: RARE  . Drug Use: No  . Sexual Activity: Yes    Birth Control/ Protection: Surgical  Other Topics Concern  . Not on file   Social History Narrative     Review of Systems     Objective:   Physical Exam  Constitutional: She is oriented to person, place, and time. She appears well-developed and well-nourished. No distress.  HENT:  Head: Normocephalic and atraumatic.  Mouth/Throat: Oropharynx is clear and moist. No oropharyngeal exudate or posterior oropharyngeal erythema.  Dorsum of tongue appears normal. No vesicles. No intraoral lesions identified. No ulcers. No focal swelling.  Eyes: Pupils are equal, round, and reactive to light.  Neck: Neck supple.  Cardiovascular: Normal rate.     Pulmonary/Chest: Effort normal.  Musculoskeletal: She exhibits no edema.  Lymphadenopathy:    She has no cervical adenopathy.  Neurological: She is alert and oriented to person, place, and time. No cranial nerve deficit.  Skin: Skin is warm and dry. No rash noted.  Psychiatric: She has a normal mood and affect. Her behavior is normal.  Nursing note and vitals reviewed.  Filed Vitals:   09/03/15 1153  BP: 110/78  Pulse: 84  Temp: 98.1 F (36.7 C)  TempSrc: Oral  Resp: 16  Height: 5' 2.5" (1.588 m)  Weight: 172 lb 4 oz (78.132 kg)  SpO2: 99%        Assessment & Plan:   Jenna Mcclure is a 34 y.o. female Tongue lesion Reassuring exam. Areas described appear to be circumvallate papillae. Reassurance provided, and reviewed diagram of normal tongue anatomy. RTC precautions.  No orders of the defined types were placed in this encounter.   Patient Instructions       IF you received an x-ray today, you will receive an invoice from [facility]. Please contact [facility] at [phone] with questions or concerns regarding your invoice.   IF you received labwork today, you will receive an invoice from [employer]. Please contact [facility] at [phone] with questions or concerns regarding your invoice.   Our billing staff will not be able to assist you with questions regarding bills from these companies.  You will be contacted with the lab results as soon as they are available. The fastest way to get your results is to activate your My Chart account. Instructions are located on the last page of this paperwork. If you have not heard from [place] regarding the results in 2 weeks, please contact this office.    The bumps on your tongue are called circumvallate papillae. These are normal anatomy, and I see nothing else of concern on exam. If you do have any pain in the back of the throat, new redness, or new blisters or bumps, return for  recheck here or your primary care provider.      I personally performed the services described in this documentation, which was scribed in my presence. The recorded information has been reviewed and considered, and addended by me as needed.

## 2015-09-14 ENCOUNTER — Encounter: Payer: Self-pay | Admitting: Family Medicine

## 2015-09-14 ENCOUNTER — Ambulatory Visit (INDEPENDENT_AMBULATORY_CARE_PROVIDER_SITE_OTHER): Payer: Managed Care, Other (non HMO) | Admitting: Family Medicine

## 2015-09-14 VITALS — BP 128/84 | HR 82 | Temp 98.2°F | Wt 171.0 lb

## 2015-09-14 DIAGNOSIS — R300 Dysuria: Secondary | ICD-10-CM | POA: Diagnosis not present

## 2015-09-14 DIAGNOSIS — L723 Sebaceous cyst: Secondary | ICD-10-CM | POA: Diagnosis not present

## 2015-09-14 DIAGNOSIS — M79601 Pain in right arm: Secondary | ICD-10-CM

## 2015-09-14 LAB — POCT URINALYSIS DIPSTICK
Bilirubin, UA: NEGATIVE
GLUCOSE UA: NEGATIVE
KETONES UA: NEGATIVE
Leukocytes, UA: NEGATIVE
Nitrite, UA: NEGATIVE
PROTEIN UA: NEGATIVE
SPEC GRAV UA: 1.02
UROBILINOGEN UA: 0.2
pH, UA: 6.5

## 2015-09-14 LAB — POCT UA - MICROSCOPIC ONLY: RBC, urine, microscopic: >20

## 2015-09-14 MED ORDER — DULOXETINE HCL 20 MG PO CPEP: 20.0000 mg | ORAL_CAPSULE | Freq: Every day | ORAL | Status: DC

## 2015-09-14 NOTE — Progress Notes (Signed)
Date of Visit: 09/14/2015   HPI:  Patient presents to discuss several issues:  - cyst in R axilla - underwent mammography and ultrasound of this area back in fall 2016 and was told likely sebaceous cyst. Intermittently gets bigger and painful. Wants to see what her options are for having it removed. No fevers.  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 Has some burning and numbness now, going down arm. Feels the same as before. In the past cymbalta worked very well, she wants to restart this today. States mood is good. Some stress lately but overall doing well  - possible UTI - has funny sensation with the last drop of her urine. Has some hesitancy and urgency for the last 4 days. No actual dysuria. Currently on menstrual period. Has had one prior UTI in the past, years ago. Denies fever, back pain, abdominal or pelvic pain.  ROS: See HPI.  [place]: history of depression, hypertension, chronic pain, GERD  PHYSICAL EXAM: BP 128/84 mmHg  Pulse 82  Temp(Src) 98.2 F (36.8 C) (Oral)  Wt 171 lb (77.565 kg)  LMP 09/14/2015 Gen: NAD, pleasant, cooperative HEENT: normocephalic, atraumatic, moist mucous membranes  Heart: regular rate and rhythm, no murmur Lungs: clear to auscultation bilaterally, normal work of breathing  Breasts: bilateral breasts normal in appearance. No erythema, deformity, or nipple discharge. No palpable abnormal masses. Right axilla with area of mild tenderness deep to skin, but no discrete abnormal masses or lymphadenopathy. Left axilla without masses or tenderness. Abdomen: soft nontender to palpation  Back: no CVA tenderness Neuro: alert, grossly nonfocal, speech normal Ext: atraumatic, full strength bilateral upper & lower extremities. Full grip strenght bilaterally. Full ROM of R shoulder.   ASSESSMENT/PLAN:  Right arm pain Previously treated with cymbalta for neuropathic pain, patient reports similar symptoms now and  would like cymbalta rx. Exam normal. - sent in rx for cymbalta - follow up with PCP  Sebaceous cyst of right axilla Given intermittent enlargement & pain, suspect surgical removal is her best option. Area not amenable to removal here in [facility]. - refer to general surgery  Urinary symptoms UA unremarkable except for blood, but currently menstruating. Doubt UTI by history or UA. Await micro, if highly suggestive will call patient & offer antibiotics   FOLLOW UP: Follow up in 1 month with PCP for R arm pain Referring to general [place]. Ardelia Mems, [place]

## 2015-09-14 NOTE — Patient Instructions (Addendum)
Preliminary urine tests are normal I'll call you if we need to start medication for UTI - doubt this is the case though  I am referring you to a general surgeon for the cyst in your armpit. You will get a phone call to schedule this appointment.   Start cymbalta for your arm - sent this in for you Schedule follow up with Dr. Gerarda Fraction in 1 month  Be well, Dr. Ardelia Mems

## 2015-09-16 DIAGNOSIS — L723 Sebaceous cyst: Secondary | ICD-10-CM | POA: Insufficient documentation

## 2015-09-16 DIAGNOSIS — M79601 Pain in right arm: Secondary | ICD-10-CM | POA: Insufficient documentation

## 2015-09-16 NOTE — Assessment & Plan Note (Signed)
Given intermittent enlargement & pain, suspect surgical removal is her best option. Area not amenable to removal here in [facility]. - refer to general surgery

## 2015-09-16 NOTE — Assessment & Plan Note (Signed)
Previously treated with cymbalta for neuropathic pain, patient reports similar symptoms now and would like cymbalta rx. Exam normal. - sent in rx for cymbalta - follow up with PCP

## 2015-09-18 ENCOUNTER — Telehealth: Payer: Self-pay | Admitting: Obstetrics and Gynecology

## 2015-09-18 NOTE — Telephone Encounter (Signed)
Covering Dr. Gerarda Fraction' box:   Attempted call back. No response. Left message to call back tomorrow during business hours if she desired.  Of note: the labs i believe she is asking about were her UA >> which did NOT show significant evidence of UTI, and thus does not require antibiotic treatment at this time.

## 2015-09-18 NOTE — Telephone Encounter (Signed)
Pt is calling and would like to know what her Lab results were, jw

## 2015-09-21 NOTE — Telephone Encounter (Signed)
lmovm for pt to return call. Aradhana Gin Dawn, CMA  

## 2015-09-27 NOTE — Telephone Encounter (Signed)
lmovm again.  Will await callback. Treyton Slimp Dawn, CMA  

## 2015-11-06 ENCOUNTER — Emergency Department (HOSPITAL_BASED_OUTPATIENT_CLINIC_OR_DEPARTMENT_OTHER)
Admission: EM | Admit: 2015-11-06 | Discharge: 2015-11-06 | Disposition: A | Discharge status: Home / Self Care | Payer: Managed Care, Other (non HMO) | Attending: Emergency Medicine | Admitting: Emergency Medicine

## 2015-11-06 ENCOUNTER — Encounter (HOSPITAL_BASED_OUTPATIENT_CLINIC_OR_DEPARTMENT_OTHER): Payer: Self-pay | Admitting: *Deleted

## 2015-11-06 DIAGNOSIS — E876 Hypokalemia: Secondary | ICD-10-CM | POA: Diagnosis not present

## 2015-11-06 DIAGNOSIS — R200 Anesthesia of skin: Secondary | ICD-10-CM | POA: Diagnosis present

## 2015-11-06 DIAGNOSIS — J0100 Acute maxillary sinusitis, unspecified: Secondary | ICD-10-CM | POA: Diagnosis not present

## 2015-11-06 LAB — COMPREHENSIVE METABOLIC PANEL
ALK PHOS: 49 U/L (ref 38–126)
ALT: 15 U/L (ref 14–54)
AST: 17 U/L (ref 15–41)
Albumin: 3.9 g/dL (ref 3.5–5.0)
Anion gap: 9 (ref 5–15)
BUN: 8 mg/dL (ref 6–20)
CALCIUM: 8.7 mg/dL — AB (ref 8.9–10.3)
CHLORIDE: 103 mmol/L (ref 101–111)
CO2: 23 mmol/L (ref 22–32)
CREATININE: 0.91 mg/dL (ref 0.44–1.00)
Glucose, Bld: 110 mg/dL — ABNORMAL HIGH (ref 65–99)
Potassium: 3 mmol/L — ABNORMAL LOW (ref 3.5–5.1)
Sodium: 135 mmol/L (ref 135–145)
Total Bilirubin: 0.6 mg/dL (ref 0.3–1.2)
Total Protein: 7.4 g/dL (ref 6.5–8.1)

## 2015-11-06 LAB — CBC WITH DIFFERENTIAL/PLATELET
BASOS PCT: 0 %
Basophils Absolute: 0 10*3/uL (ref 0.0–0.1)
EOS ABS: 0.1 10*3/uL (ref 0.0–0.7)
EOS PCT: 1 %
HCT: 34.3 % — ABNORMAL LOW (ref 36.0–46.0)
Hemoglobin: 11.4 g/dL — ABNORMAL LOW (ref 12.0–15.0)
LYMPHS ABS: 2.1 10*3/uL (ref 0.7–4.0)
Lymphocytes Relative: 31 %
MCH: 27.5 pg (ref 26.0–34.0)
MCHC: 33.2 g/dL (ref 30.0–36.0)
MCV: 82.7 fL (ref 78.0–100.0)
MONOS PCT: 7 %
Monocytes Absolute: 0.5 10*3/uL (ref 0.1–1.0)
NEUTROS PCT: 61 %
Neutro Abs: 4.1 10*3/uL (ref 1.7–7.7)
PLATELETS: 280 10*3/uL (ref 150–400)
RBC: 4.15 MIL/uL (ref 3.87–5.11)
RDW: 14.9 % (ref 11.5–15.5)
WBC: 6.7 10*3/uL (ref 4.0–10.5)

## 2015-11-06 MED ORDER — POTASSIUM CHLORIDE CRYS ER 20 MEQ PO TBCR
40.0000 meq | EXTENDED_RELEASE_TABLET | Freq: Once | ORAL | Status: AC
  Administered 2015-11-06: 40 meq via ORAL
  Filled 2015-11-06: qty 2

## 2015-11-06 MED ORDER — AMOXICILLIN-POT CLAVULANATE 875-125 MG PO TABS: 1.0000 | ORAL_TABLET | Freq: Two times a day (BID) | ORAL | Status: DC

## 2015-11-06 MED ORDER — FLUCONAZOLE 150 MG PO TABS: 150.0000 mg | ORAL_TABLET | Freq: Every day | ORAL | Status: DC

## 2015-11-06 MED FILL — AMOX-CLAV 875-125 MG TABLET: 875-125 | 5 days supply | Qty: 10 | Fill #0

## 2015-11-06 NOTE — Discharge Instructions (Signed)
1. Medications: Take antibiotics as directed, continue usual home medications 2. Treatment: rest, drink plenty of fluids 3. Follow Up: Please follow up with your primary doctor in 5-7 days for discussion of your diagnoses and further evaluation after today's visit; Please return to the ER for new or worsening symptoms, any additional concerns.

## 2015-11-06 NOTE — ED Notes (Signed)
Pt. Reports she under a lot of stress:  Pt. States her mother has cancer and her son has thyroid disease and she is under stress of both.  Pt. Very tearful and scared.

## 2015-11-06 NOTE — ED Provider Notes (Signed)
CSN: [id number]     Arrival date & time 11/06/15  1318 History   First MD Initiated Contact with Patient 11/06/15 1332     Chief Complaint  Patient presents with  . Numbness    (Consider location/radiation/quality/duration/timing/severity/associated sxs/prior Treatment) The history is provided by the patient and medical records. No language interpreter was used.     Jenna Mcclure is a 35 y.o. female  with a PMH of GERD who presents to the Emergency Department complaining of intermittent perioral and right sided facial numbness/tingling x 2 days. Patient with recent cough/cold/congestion this week as well. She has history of intermittent right arm and leg numbness/tingling which is followed by her PCP. She is on cymbalta for this and states it is fairly well controlled. Patient states she is under a large amount of stress lately. Her mother has cancer she has her primary care giver. When asked what her biggest concern is in regards to today's visit, patient states she is concerned that something bad may happen to her and she will not be there to provide care for her mother. Patient states she has not been sleeping well either. Patient denies fever, weakness, slurred speech, headache, dizziness, changes in vision.   Past Medical History  Diagnosis Date  . GERD (gastroesophageal reflux disease)   . Allergic rhinitis   . History of abnormal cervical Pap smear   . Heart murmur     MILD -  ASYMPTOMATIC  . Borderline hypertension   . History of ovarian cyst   . Wears glasses   . Breastfeeding (infant)    Past Surgical History  Procedure Laterality Date  . Diagnostic laparoscopy  2009  . Laparoscopic tubal ligation Bilateral 02/15/2014    Procedure: LAPAROSCOPIC TUBAL LIGATION WITH FILSHIE CLIPS;  Surgeon: Margarette Asal, MD;  Location: [facility];  Service: Gynecology;  Laterality: Bilateral;  . Tubal ligation     Family History  Problem Relation Age of Onset  .  Hypertension Mother   . Cancer Mother   . Hypertension Maternal Aunt   . Diabetes Maternal Uncle   . Diabetes Maternal Grandmother   . Diabetes Paternal Grandmother   . Cancer Father     leaukemia   Social History  Substance Use Topics  . Smoking status: Never Smoker   . Smokeless tobacco: Never Used  . Alcohol Use: Yes     Comment: RARE   OB History    Gravida Para Term Preterm AB TAB SAB Ectopic Multiple Living   6 6 6  0 0 0 0 0 0 6     Review of Systems  Constitutional: Negative for fever and chills.  HENT: Positive for congestion and sinus pressure.   Eyes: Negative for visual disturbance.  Respiratory: Negative for cough and shortness of breath.   Cardiovascular: Negative.   Gastrointestinal: Negative for nausea, vomiting and abdominal pain.  Musculoskeletal: Negative for back pain and neck pain.  Skin: Negative for rash.  Neurological: Negative for dizziness, weakness and headaches.  Psychiatric/Behavioral: The patient is nervous/anxious.       Allergies  Miconazole nitrate  Home Medications   Prior to Admission medications   Medication Sig Start Date End Date Taking? Authorizing Provider  amoxicillin-clavulanate (AUGMENTIN) 875-125 MG tablet Take 1 tablet by mouth every 12 (twelve) hours. 11/06/15   Ozella Almond Calais Svehla, PA-C  cetirizine (ZYRTEC) 10 MG tablet Take 1 tablet (10 mg total) by mouth daily. 07/12/15   Joretta Bachelor, PA  Cholecalciferol (VITAMIN  D) 2000 UNITS CAPS Take 2,000 Units by mouth daily.    Historical Provider, MD  DULoxetine (CYMBALTA) 20 MG capsule Take 1 capsule (20 mg total) by mouth daily. 09/14/15   Leeanne Rio, MD  fluconazole (DIFLUCAN) 150 MG tablet Take 1 tablet (150 mg total) by mouth daily. 11/06/15 11/13/15  Ozella Almond Helmer Dull, PA-C  ibuprofen (ADVIL,MOTRIN) 200 MG tablet Take 600 mg by mouth every 6 (six) hours as needed for mild pain or moderate pain. Reported on 07/12/2015    Historical Provider, MD  methocarbamol (ROBAXIN)  500 MG tablet Take 1 tablet (500 mg total) by mouth 2 (two) times daily. 05/21/15   Tanna Furry, MD  naproxen (NAPROSYN) 500 MG tablet Take 1 tablet (500 mg total) by mouth 2 (two) times daily. 05/21/15   Tanna Furry, MD  triamcinolone (NASACORT) 55 MCG/ACT AERO nasal inhaler Place 2 sprays into the nose daily. 07/12/15   Dorian Heckle English, PA   BP 119/84 mmHg  Pulse 82  Temp(Src) 98.4 F (36.9 C) (Oral)  Resp 20  Ht 5\' 2"  (1.575 m)  Wt 75.751 kg  BMI 30.54 kg/m2  SpO2 100%  LMP 10/16/2015 Physical Exam  Constitutional: She is oriented to person, place, and time. She appears well-developed and well-nourished. No distress.  Anxious, tearful  HENT:  Head: Normocephalic and atraumatic.  OP with erythema, no exudates or tonsillar hypertrophy. + nasal congestion with mucosal edema.   Neck: Neck supple.  No midline tenderness. Full ROM  Cardiovascular: Normal rate, regular rhythm and normal heart sounds.   Pulmonary/Chest: Effort normal and breath sounds normal. No respiratory distress.  Abdominal: Soft. She exhibits no distension. There is no tenderness.  Musculoskeletal: Normal range of motion.  Neurological: She is alert and oriented to person, place, and time.  Alert, oriented, thought content appropriate, able to give a coherent history. Speech is clear and goal oriented, able to follow commands.  Cranial Nerves:  II:  Peripheral visual fields grossly normal, pupils equal, round, reactive to light III, IV, VI: EOM intact bilaterally, ptosis not present V,VII: smile symmetric, eyes kept closed tightly against resistance, facial light touch sensation equal VIII: hearing grossly normal IX, X: symmetric soft palate movement, uvula elevates symmetrically  XI: bilateral shoulder shrug symmetric and strong XII: midline tongue extension 5/5 muscle strength in upper and lower extremities bilaterally including strong and equal grip strength and dorsiflexion/plantar flexion Sensory to light  touch normal in all four extremities.  Normal finger-to-nose and rapid alternating movements; normal gait and balance.  Skin: Skin is warm and dry. She is not diaphoretic.  Nursing note and vitals reviewed.   ED Course  Procedures (including critical care time) Labs Review Labs Reviewed  COMPREHENSIVE METABOLIC PANEL - Abnormal; Notable for the following:    Potassium 3.0 (*)    Glucose, Bld 110 (*)    Calcium 8.7 (*)    All other components within normal limits  CBC WITH DIFFERENTIAL/PLATELET - Abnormal; Notable for the following:    Hemoglobin 11.4 (*)    HCT 34.3 (*)    All other components within normal limits    Imaging Review No results found. I have personally reviewed and evaluated these images and lab results as part of my medical decision-making.   EKG Interpretation None      MDM   Final diagnoses:  Acute maxillary sinusitis, recurrence not specified  Hypokalemia  Right facial numbness   Jenna Mcclure presents to ED for perioral numbness which has occurred  intermittently over the last 2 days. No focal neuro deficits on exam. Significant right-sided sinus tenderness with associated nasal congestion. Patient is very anxious and tearful, expressing that she is under a significant amount of stress lately and has not been eating or sleeping well. This is possibly contributing to today's symptoms.  CBC and CMP reviewed and reassuring. K+ of 3.0 which was replenished in ED. Discussed results and findings with patient. Will treat sinusitis with short course augmentin. Informed patient that head CT would be the only thing left to check in order to fully evaluate today's symptoms. Informed patient we could do CT here today, or treat sinusitis with ABX and follow up with PCP if symptoms do not improve to have CT head done. Patient states she has good follow up with her family medicine physician and would prefer CT head as outpatient if symptoms don't improve. Return precautions  given and all questions answered.   Patient discussed with Dr. Tomi Bamberger who agrees with treatment plan.   [facility] Niti Leisure, PA-C 11/06/15 1611  Dorie Rank, MD 11/09/15 [phone]

## 2015-11-06 NOTE — ED Notes (Signed)
Pt. Tearful at present time and reports sinus congestion with numbness in the R side of her face.  Pt. Reports numbness started on the R side of her face on Sat. And again today.  Pt. Talking clear sentences and no slurred speech.  Mouth is moving with no difficulty.  Pt. Able to blink and stick her tongue out WNL Pt. Having normal facial movements bilat.  Pt. Reports TMJ trouble on the R side.

## 2015-11-06 NOTE — ED Notes (Signed)
Problems with numbness on her right side for years. She is here today for numbness to her face and under her right eye that started 2 days ago.

## 2015-11-09 ENCOUNTER — Ambulatory Visit (INDEPENDENT_AMBULATORY_CARE_PROVIDER_SITE_OTHER): Payer: Managed Care, Other (non HMO) | Admitting: Obstetrics and Gynecology

## 2015-11-09 ENCOUNTER — Encounter: Payer: Self-pay | Admitting: Obstetrics and Gynecology

## 2015-11-09 VITALS — BP 135/90 | HR 99 | Temp 98.2°F | Ht 62.0 in | Wt 167.8 lb

## 2015-11-09 DIAGNOSIS — J320 Chronic maxillary sinusitis: Secondary | ICD-10-CM | POA: Diagnosis not present

## 2015-11-09 DIAGNOSIS — E876 Hypokalemia: Secondary | ICD-10-CM

## 2015-11-09 DIAGNOSIS — G510 Bell's palsy: Secondary | ICD-10-CM | POA: Diagnosis not present

## 2015-11-09 DIAGNOSIS — R200 Anesthesia of skin: Secondary | ICD-10-CM

## 2015-11-09 DIAGNOSIS — Z09 Encounter for follow-up examination after completed treatment for conditions other than malignant neoplasm: Secondary | ICD-10-CM

## 2015-11-09 DIAGNOSIS — R208 Other disturbances of skin sensation: Secondary | ICD-10-CM

## 2015-11-09 DIAGNOSIS — H6593 Unspecified nonsuppurative otitis media, bilateral: Secondary | ICD-10-CM

## 2015-11-09 DIAGNOSIS — E559 Vitamin D deficiency, unspecified: Secondary | ICD-10-CM

## 2015-11-09 NOTE — Patient Instructions (Signed)
Here are some of the things we discussed today: -continue antibiotics for sinusitis -Someone will call you about time for MRI and location - will contact you about results when i obtain them -checking labs again today -go to ED if symptom worsen or new symtpoms  Please schedule a follow-up appointment for 3-4 weeks   Thanks for allowing me to be a part of your care! Dr. Gerarda Fraction   Sinusitis, Adult Sinusitis is redness, soreness, and puffiness (inflammation) of the air pockets in the bones of your face (sinuses). The redness, soreness, and puffiness can cause air and mucus to get trapped in your sinuses. This can allow germs to grow and cause an infection.  HOME CARE   Drink enough fluids to keep your pee (urine) clear or pale yellow.  Use a humidifier in your home.  Run a hot shower to create steam in the bathroom. Sit in the bathroom with the door closed. Breathe in the steam 3-4 times a day.  Put a warm, moist washcloth on your face 3-4 times a day, or as told by your doctor.  Use salt water sprays (saline sprays) to wet the thick fluid in your nose. This can help the sinuses drain.  Only take medicine as told by your doctor. GET HELP RIGHT AWAY IF:   Your pain gets worse.  You have very bad headaches.  You are sick to your stomach (nauseous).  You throw up (vomit).  You are very sleepy (drowsy) all the time.  Your face is puffy (swollen).  Your vision changes.  You have a stiff neck.  You have trouble breathing. MAKE SURE YOU:   Understand these instructions.  Will watch your condition.  Will get help right away if you are not doing well or get worse.   This information is not intended to replace advice given to you by your health care provider. Make sure you discuss any questions you have with your health care provider.   Document Released: 11/06/2007 Document Revised: 06/10/2014 Document Reviewed: 12/24/2011 Elsevier Interactive Patient Education [employer]

## 2015-11-09 NOTE — Progress Notes (Signed)
Subjective: Chief Complaint  Patient presents with  . Hospitalization Follow-up    face numbness, fluid behind ears     HPI: Jenna Mcclure is a 35 y.o. presenting to clinic today to discuss the following:  #Sinusitis -went to ED on Monday for numbness and tingling on face with associated congestion symptoms -diagnosed with sinusitis -states she has h/o recurrent sinusitis for about 4 years -was given augmentin in ED -has two days left of the medication -also was noted to have bilateral ear effusions -endorses head pressure  -takes zyrtec-D to help with symptoms -has ENT appointment today  #Facial Numbness -complains of facial numbness that started on Saturday -was told by ED doctor that possible could be caused by tumor -no imaging performed in ED -continues to endorse numbness that occurs around perioral region of mouth and more on right -searched about this online and now very anxious that she has cancer or is going to die -theses symptoms are new for patient -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 D Deficiency -states she has been on Vit. D for years -lowest value she remembers is 10 -cannot recall getting a recheck of levels  ROS and PMH noted in HPI.  Past Medical, Surgical, Social, and Family History Reviewed & Updated per EMR. Smoking status - never smoker   Objective: BP 135/90 mmHg  Pulse 99  Temp(Src) 98.2 F (36.8 C) (Oral)  Ht 5\' 2"  (1.575 m)  Wt 167 lb 12.8 oz (76.114 kg)  BMI 30.68 kg/m2  SpO2 100%  LMP 10/16/2015 Vitals and nursing notes reviewed  Physical Exam  Constitutional: She is oriented to person, place, and time and well-developed, well-nourished, and in no distress.  HENT:  Head: Normocephalic and atraumatic.  Right Ear: A middle ear effusion is  present.  Left Ear: A middle ear effusion is present.  Mouth/Throat: Oropharynx is clear and moist and mucous membranes are normal.  Eyes: Conjunctivae and EOM are normal. Pupils are equal, round, and reactive to light.  Neck: Normal range of motion. Neck supple.  Cardiovascular: Normal rate, regular rhythm and normal heart sounds.   Pulmonary/Chest: Effort normal and breath sounds normal.  Musculoskeletal: Normal range of motion.  Neurological: She is alert and oriented to person, place, and time. She has normal motor skills and normal strength. She displays normal speech. A sensory deficit is present. Gait normal.  Cranial nerves intact except for the following deficits V,VII: smile and check puff asymmetric with right side deficit, eyes kept closed tightly against resistance, facial light touch sensation decreased and abnormal, eyebrow raise symmetric   Sensory to light touch normal in all four extremities.   Skin: Skin is warm, dry and intact.  Psychiatric: Her mood appears anxious.  tearful    Assessment/Plan: Please see problem based Assessment and Plan   Orders Placed This Encounter  Procedures  . MR MRA Head/Brain Wo Cm    Standing Status: Future     Number of Occurrences:      Standing Expiration Date: 01/08/2017    Order Specific Question:  Reason for Exam (SYMPTOM  OR DIAGNOSIS REQUIRED)    Answer:  facial numbness    Order Specific Question:  Preferred imaging location?    Answer:  Internal    Order Specific Question:  What is the  patient's sedation requirement?    Answer:  Anti-anxiety    Order Specific Question:  Does the patient have a pacemaker or implanted devices?    Answer:  No  . Basic Metabolic Panel  . VITAMIN D 25 Hydroxy (Vit-D Deficiency, Fractures)     Luiz Blare, DO 11/09/2015, 2:34 PM PGY-2, [place]

## 2015-11-10 ENCOUNTER — Telehealth: Payer: Self-pay | Admitting: *Deleted

## 2015-11-10 DIAGNOSIS — H6590 Unspecified nonsuppurative otitis media, unspecified ear: Secondary | ICD-10-CM | POA: Insufficient documentation

## 2015-11-10 DIAGNOSIS — G510 Bell's palsy: Secondary | ICD-10-CM | POA: Insufficient documentation

## 2015-11-10 DIAGNOSIS — E559 Vitamin D deficiency, unspecified: Secondary | ICD-10-CM | POA: Insufficient documentation

## 2015-11-10 DIAGNOSIS — E876 Hypokalemia: Secondary | ICD-10-CM | POA: Insufficient documentation

## 2015-11-10 LAB — BASIC METABOLIC PANEL
BUN: 7 mg/dL (ref 7–25)
CO2: 21 mmol/L (ref 20–31)
Calcium: 9.3 mg/dL (ref 8.6–10.2)
Chloride: 103 mmol/L (ref 98–110)
Creat: 0.86 mg/dL (ref 0.50–1.10)
GLUCOSE: 114 mg/dL — AB (ref 65–99)
POTASSIUM: 4.1 mmol/L (ref 3.5–5.3)
SODIUM: 138 mmol/L (ref 135–146)

## 2015-11-10 LAB — VITAMIN D 25 HYDROXY (VIT D DEFICIENCY, FRACTURES): Vit D, 25-Hydroxy: 28 ng/mL — ABNORMAL LOW (ref 30–100)

## 2015-11-10 NOTE — Assessment & Plan Note (Addendum)
Acute maxillary sinusitis with bilateral ear effusions. Being treated with augmentin with 2 days left of treatment. Follow-up with ENT for chronic sinus infections. Obtaining head CT to visualize sinuses. May need sinus surgery with recurrent sinus infections and h/o second sickening.

## 2015-11-10 NOTE — Telephone Encounter (Signed)
Order for imaging printed and faxed to [facility].  Spoke with patient and she is aware that they will be contacting her to schedule this appt. Jenna Mcclure,CMA

## 2015-11-10 NOTE — Assessment & Plan Note (Signed)
Acute. No signs of infection. Ear exam unremarkable other than the effusions. Afebrile. Monitor and recheck at next visit.

## 2015-11-10 NOTE — Assessment & Plan Note (Signed)
S/p repletion in ED. Will obtain repeat labs.

## 2015-11-10 NOTE — Assessment & Plan Note (Signed)
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 Also sinus congestion and pressure may be affecting facial nerves. Cannot rule out tumor or CVA. MRI head/brain ordered. Warning signs and when to seek help given to patient.

## 2015-11-10 NOTE — Assessment & Plan Note (Signed)
Currently taking Vit. D 2000U daily. Does not have a Vitamin D level since 2012 and the level was 23. Rechecking level today. Continue current therapy at this time.

## 2015-11-10 NOTE — Telephone Encounter (Signed)
Pt calling want to know if her MRI was scheduled yet. Deseree Kennon Holter, CMA

## 2015-11-15 ENCOUNTER — Other Ambulatory Visit: Payer: Self-pay | Admitting: Obstetrics and Gynecology

## 2015-11-15 ENCOUNTER — Telehealth: Payer: Self-pay | Admitting: *Deleted

## 2015-11-15 ENCOUNTER — Other Ambulatory Visit: Payer: Self-pay | Admitting: Family Medicine

## 2015-11-15 DIAGNOSIS — R200 Anesthesia of skin: Secondary | ICD-10-CM

## 2015-11-15 NOTE — Telephone Encounter (Signed)
Received call from [facility] and they need to know if the order for MRA Brain/Head is needing to be specific for one of these areas or both?  If so then it needs to be 2 separate orders.  Please advise. Charlane Westry,CMA

## 2015-11-16 NOTE — Telephone Encounter (Signed)
Discussed with Jazmin in person. Orders figured out.

## 2015-11-18 ENCOUNTER — Other Ambulatory Visit: Payer: Self-pay | Admitting: Obstetrics and Gynecology

## 2015-11-18 ENCOUNTER — Ambulatory Visit
Admission: RE | Admit: 2015-11-18 | Discharge: 2015-11-18 | Disposition: A | Discharge status: Home / Self Care | Payer: Managed Care, Other (non HMO) | Source: Ambulatory Visit | Attending: Family Medicine | Admitting: Family Medicine

## 2015-11-18 DIAGNOSIS — R200 Anesthesia of skin: Secondary | ICD-10-CM

## 2015-11-18 DIAGNOSIS — G510 Bell's palsy: Secondary | ICD-10-CM

## 2015-11-18 DIAGNOSIS — J320 Chronic maxillary sinusitis: Secondary | ICD-10-CM

## 2015-11-18 MED ORDER — GADOBENATE DIMEGLUMINE 529 MG/ML IV SOLN
15.0000 mL | Freq: Once | INTRAVENOUS | Status: AC | PRN
  Administered 2015-11-18: 15 mL via INTRAVENOUS

## 2015-12-08 ENCOUNTER — Other Ambulatory Visit: Payer: Self-pay | Admitting: Family Medicine

## 2015-12-25 ENCOUNTER — Ambulatory Visit: Payer: Managed Care, Other (non HMO) | Admitting: Obstetrics and Gynecology

## 2016-01-22 ENCOUNTER — Ambulatory Visit (INDEPENDENT_AMBULATORY_CARE_PROVIDER_SITE_OTHER): Payer: Managed Care, Other (non HMO) | Admitting: Obstetrics and Gynecology

## 2016-01-22 ENCOUNTER — Encounter: Payer: Self-pay | Admitting: Obstetrics and Gynecology

## 2016-01-22 VITALS — BP 138/81 | HR 89 | Temp 98.7°F | Ht 62.0 in | Wt 166.0 lb

## 2016-01-22 DIAGNOSIS — Z659 Problem related to unspecified psychosocial circumstances: Secondary | ICD-10-CM | POA: Diagnosis not present

## 2016-01-22 DIAGNOSIS — L723 Sebaceous cyst: Secondary | ICD-10-CM | POA: Diagnosis not present

## 2016-01-22 MED ORDER — DULOXETINE HCL 20 MG PO CPEP: 20.0000 mg | ORAL_CAPSULE | Freq: Every day | ORAL | 3 refills | Status: DC

## 2016-01-22 NOTE — Progress Notes (Signed)
Date of Visit: 01/22/2016   HPI:  Patient presents to discuss the following issues:  - cyst in R axilla - has underwent mammography and ultrasound of this area back in fall 2016 and was told likely sebaceous cyst. Intermittently gets bigger and painful. Wants to see what her options are for having it removed. No fevers. Had referral to general surgery but was not able to make appointment so needs new referral. Has been present for over 2 years now. No drainage.  - Stress - states that she has been under a lot of stress due to her sick mother. Mother has been diagnosed with advance bladder cancer. She states she has to be strong for her mother. Starting to feel depressed and drained. Admits to anxiety. Stopped taking cymbalta a while ago cause thought it made her nauseous but states it could have been related to her being on her period.  ROS: See HPI.  Hall: history of depression, hypertension, chronic pain, GERD  PHYSICAL EXAM: BP 138/81   Pulse 89   Temp 98.7 F (37.1 C) (Oral)   Ht 5\' 2"  (1.575 m)   Wt 166 lb (75.3 kg)   BMI 30.36 kg/m  Gen: NAD, pleasant, cooperative HEENT: normocephalic, atraumatic, moist mucous membranes  Heart: regular rate and rhythm, no murmur Lungs: clear to auscultation bilaterally, normal work of breathing  Breasts: bilateral breasts normal in appearance. No erythema, deformity, or nipple discharge. No palpable abnormal masses. Right axilla with area of mild tenderness deep to skin, but no discrete abnormal masses or lymphadenopathy. Left axilla without masses or tenderness. Neuro: alert, grossly nonfocal  Depression screen [facility] 2/9 01/25/2016  Decreased Interest 2  Down, Depressed, Hopeless 2  PHQ - 2 Score 4  Altered sleeping 3  Tired, decreased energy 2  Change in appetite 2  Feeling bad or failure about yourself  0  Trouble concentrating 0  Moving slowly or fidgety/restless 0  Suicidal thoughts 0  PHQ-9 Score 11  Difficult doing work/chores  Somewhat difficult    ASSESSMENT/PLAN: Please see separate assessment and plan.    Luiz Blare, DO PGY-3, [facility]

## 2016-01-22 NOTE — Patient Instructions (Signed)
Referral replaced Duloxetine at the lowest dose sent in Come back in anytime to talk if need be. Placed the behavioral health information below they are a great resource   [facility] Your primary care provider may refer you to a [facility] Consultant for a 15-30 minute visit. The [facility] will focus on a particular problem.  After talking to you, the [facility] will help you make any changes you want to make centered around your health. [facility] can help you with: .             Difficult life problems .             Stress, depression or anxiety .             Coping with medical problems .             Reflect on harmful habits (alcohol, tobacco and drugs),  .             Learning relaxation skills  .             Sleep difficulties  .             Mental health concerns  Call [phone] to schedule an appointment  Stress and Stress Management Stress is a normal reaction to life events. It is what you feel when life demands more than you are used to or more than you can handle. Some stress can be useful. For example, the stress reaction can help you catch the last bus of the day, study for a test, or meet a deadline at work. But stress that occurs too often or for too long can cause problems. It can affect your emotional health and interfere with relationships and normal daily activities. Too much stress can weaken your immune system and increase your risk for physical illness. If you already have a medical problem, stress can make it worse. CAUSES  All sorts of life events may cause stress. An event that causes stress for one person may not be stressful for another person. Major life events commonly cause stress. These may be positive or negative. Examples include losing your job, moving into a new home, getting married, having a baby, or losing a loved one. Less obvious life events may also cause stress, especially if they occur day after day or in  combination. Examples include working long hours, driving in traffic, caring for children, being in debt, or being in a difficult relationship. SIGNS AND SYMPTOMS Stress may cause emotional symptoms including, the following:  Anxiety. This is feeling worried, afraid, on edge, overwhelmed, or out of control.  Anger. This is feeling irritated or impatient.  Depression. This is feeling sad, down, helpless, or guilty.  Difficulty focusing, remembering, or making decisions. Stress may cause physical symptoms, including the following:   Aches and pains. These may affect your head, neck, back, stomach, or other areas of your body.  Tight muscles or clenched jaw.  Low energy or trouble sleeping. Stress may cause unhealthy behaviors, including the following:   Eating to feel better (overeating) or skipping meals.  Sleeping too little, too much, or both.  Working too much or putting off tasks (procrastination).  Smoking, drinking alcohol, or using drugs to feel better. DIAGNOSIS  Stress is diagnosed through an assessment by your health care provider. Your health care provider will ask questions about your symptoms and any stressful life events.Your health care provider will also ask about your  medical history and may order blood tests or other tests. Certain medical conditions and medicine can cause physical symptoms similar to stress. Mental illness can cause emotional symptoms and unhealthy behaviors similar to stress. Your health care provider may refer you to a mental health professional for further evaluation.  TREATMENT  Stress management is the recommended treatment for stress.The goals of stress management are reducing stressful life events and coping with stress in healthy ways.  Techniques for reducing stressful life events include the following:  Stress identification. Self-monitor for stress and identify what causes stress for you. These skills may help you to avoid some  stressful events.  Time management. Set your priorities, keep a calendar of events, and learn to say "no." These tools can help you avoid making too many commitments. Techniques for coping with stress include the following:  Rethinking the problem. Try to think realistically about stressful events rather than ignoring them or overreacting. Try to find the positives in a stressful situation rather than focusing on the negatives.  Exercise. Physical exercise can release both physical and emotional tension. The key is to find a form of exercise you enjoy and do it regularly.  Relaxation techniques. These relax the body and mind. Examples include yoga, meditation, tai chi, biofeedback, deep breathing, progressive muscle relaxation, listening to music, being out in nature, journaling, and other hobbies. Again, the key is to find one or more that you enjoy and can do regularly.  Healthy lifestyle. Eat a balanced diet, get plenty of sleep, and do not smoke. Avoid using alcohol or drugs to relax.  Strong support network. Spend time with family, friends, or other people you enjoy being around.Express your feelings and talk things over with someone you trust. Counseling or talktherapy with a mental health professional may be helpful if you are having difficulty managing stress on your own. Medicine is typically not recommended for the treatment of stress.Talk to your health care provider if you think you need medicine for symptoms of stress. HOME CARE INSTRUCTIONS  Keep all follow-up visits as directed by your health care provider.  Take all medicines as directed by your health care provider. SEEK MEDICAL CARE IF:  Your symptoms get worse or you start having new symptoms.  You feel overwhelmed by your problems and can no longer manage them on your own. SEEK IMMEDIATE MEDICAL CARE IF:  You feel like hurting yourself or someone else.   This information is not intended to replace advice given to  you by your health care provider. Make sure you discuss any questions you have with your health care provider.   Document Released: 11/13/2000 Document Revised: 06/10/2014 Document Reviewed: 01/12/2013 Elsevier Interactive Patient Education [employer].

## 2016-01-23 ENCOUNTER — Other Ambulatory Visit: Payer: Self-pay | Admitting: *Deleted

## 2016-01-23 MED ORDER — DULOXETINE HCL 20 MG PO CPEP: 20.0000 mg | ORAL_CAPSULE | Freq: Every day | ORAL | 0 refills | Status: DC

## 2016-01-23 NOTE — Telephone Encounter (Signed)
Received fax from [facility] stating that patient's insurance is requiring a 90 day supply of Duloxetine.  Please advise.  Derl Barrow, RN

## 2016-01-25 NOTE — Assessment & Plan Note (Signed)
Referred to general surgery again for possible removal of cyst under arm. Due to area not a good candidate for removal in clinic. Currently not infected or enlarged.

## 2016-01-25 NOTE — Assessment & Plan Note (Addendum)
Acutely stressed more than usual due to family illness. Will restart Cymbalta today as patient states it worked; will notify office if still making her nauseous and will swith to another medication. PHQ-9 was 11. Patient declined [facility] consult today but information given on handout. Discussed stress management and ways to reduce stress.

## 2016-03-08 ENCOUNTER — Encounter: Payer: Self-pay | Admitting: Obstetrics and Gynecology

## 2016-03-08 ENCOUNTER — Encounter: Payer: Self-pay | Admitting: Psychology

## 2016-03-08 ENCOUNTER — Ambulatory Visit (INDEPENDENT_AMBULATORY_CARE_PROVIDER_SITE_OTHER): Payer: Managed Care, Other (non HMO) | Admitting: Obstetrics and Gynecology

## 2016-03-08 VITALS — BP 123/75 | HR 73 | Temp 98.3°F | Wt 165.0 lb

## 2016-03-08 DIAGNOSIS — F329 Major depressive disorder, single episode, unspecified: Secondary | ICD-10-CM

## 2016-03-08 DIAGNOSIS — F322 Major depressive disorder, single episode, severe without psychotic features: Secondary | ICD-10-CM

## 2016-03-08 MED ORDER — VITAMIN D (ERGOCALCIFEROL) 1.25 MG (50000 UNIT) PO CAPS: 50000.0000 [IU] | ORAL_CAPSULE | ORAL | 0 refills | Status: DC

## 2016-03-08 MED ORDER — ONDANSETRON HCL 4 MG PO TABS: 4.0000 mg | ORAL_TABLET | Freq: Four times a day (QID) | ORAL | 0 refills | Status: DC | PRN

## 2016-03-08 MED ORDER — DULOXETINE HCL 40 MG PO CPEP: 40.0000 mg | ORAL_CAPSULE | Freq: Every day | ORAL | 0 refills | Status: DC

## 2016-03-08 NOTE — Progress Notes (Signed)
Subjective:   Patient ID: Jenna Mcclure, female    DOB: 1981/02/16, 35 y.o.   MRN: [id number]  Patient presents for Same Day Appointment  Chief Complaint  Patient presents with  . Depression    Subjective:  Chief complaint-noted  Concern for Depression  HPI:  Factors: Mother is sick. She has advanced cancer and patient is support system for mom. She also is mother to 6 children who live in the home.   Symptoms (SIGECAPS):   1. Depressed Mood: Yes   2. Decreased Interest: Yes  3. SI/HI: No  4. PHQ9: 22   Medical History including Psychiatric   1. 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 Psych history/ever hospitalized: No  3. Alcohol drinks per week: none ; smoking none; other drugs: none   Report of symptoms: Reports decreased appetite and sleep.  States this morning she snapped on her children. She also is noticing that she does not remember chunks of her time.   Duration of CURRENT symptoms: Since August  Impact on function: Unable to fully function like normal. Believes she needs time off from work.   Psychiatric History - Diagnoses: PPD  - Hospitalizations:  None   Current and history of substance use: No  Medical conditions that might explain or contribute to symptoms:  Patient Active Problem List   Diagnosis Date Noted  . Vitamin D deficiency 11/10/2015  . Hypokalemia 11/10/2015  . 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 Sebaceous cyst of right axilla 09/16/2015  . Other social stressor 11/26/2012  . GERD (gastroesophageal reflux disease) 03/19/2011  . Chronic pain syndrome 02/12/2011    Family history:  1. Any history of psychiatric issues: None   Medications- reviewed and updated Current Outpatient Prescriptions  Medication Sig Dispense Refill  . amoxicillin-clavulanate (AUGMENTIN) 875-125 MG tablet Take 1 tablet by mouth every 12 (twelve) hours. 10 tablet 0  . cetirizine (ZYRTEC) 10 MG tablet Take 1 tablet (10 mg total) by  mouth daily. 30 tablet 11  . Cholecalciferol (VITAMIN D) 2000 UNITS CAPS Take 2,000 Units by mouth daily.    . DULoxetine (CYMBALTA) 20 MG capsule Take 1 capsule (20 mg total) by mouth daily. 90 capsule 0  . ibuprofen (ADVIL,MOTRIN) 200 MG tablet Take 600 mg by mouth every 6 (six) hours as needed for mild pain or moderate pain. Reported on 07/12/2015    . triamcinolone (NASACORT) 55 MCG/ACT AERO nasal inhaler Place 2 sprays into the nose daily. 1 Inhaler 1   No current facility-administered medications for this visit.    See HPI for ROS.   Smoking status - Never Smoker  Objective: BP 123/75   Pulse 73   Temp 98.3 F (36.8 C) (Oral)   Wt 165 lb (74.8 kg)   LMP 02/17/2016   SpO2 100%   BMI 30.18 kg/m  Physical Exam Gen: NAD Neuro: grossly normal, moves all extremities Psych: depressed mood, tearful, denies HI/SI  Depression screen [facility] 2/9 03/08/2016 01/25/2016 01/22/2016  Decreased Interest 3 2 0  Down, Depressed, Hopeless 3 2 0  PHQ - 2 Score 6 4 0  Altered sleeping 3 3 -  Tired, decreased energy 3 2 -  Change in appetite 3 2 -  Feeling bad or failure about yourself  2 0 -  Trouble concentrating 3 0 -  Moving slowly or fidgety/restless 2 0 -  Suicidal thoughts 0 0 -  PHQ-9 Score 22 11 -  Difficult doing  work/chores Somewhat difficult Somewhat difficult -   Assessment/Plan:  Concern for Depression.  Most likely reactive in nature due to current life stressors.   PHQ9 >20: Severe Depression. -discussed need for medication and counseling. Patient in agreement to both. Will follow up with me in 2 weeks. Regarding medication, will assess safety (SI), tolerability (SE), and efficacy(repeat PHQ9) at next visit and titrate up as needed.  Regarding SI, patient denies. Patient is able to contract for safety: yes.  Medication Choice-patient wants to continue Cymbalta. Will dose for treatment of depression. Start at 63m and will increase as indicated.   [facility]  [facility]. Dr. KGwenlyn Saranmet with patient. Please see her note for additional details. She has scheduled follow-up appointment next Friday.  Patient given zofran for nausea.   Handout given.  Meds ordered this encounter  Medications  . DULoxetine 40 MG CPEP    Sig: Take 40 mg by mouth daily.    Dispense:  30 capsule    Refill:  0  . ondansetron (ZOFRAN) 4 MG tablet    Sig: Take 1 tablet (4 mg total) by mouth every 6 (six) hours as needed for nausea or vomiting.    Dispense:  20 tablet    Refill:  0  . Vitamin D, Ergocalciferol, (DRISDOL) 50000 units CAPS capsule    Sig: Take 1 capsule (50,000 Units total) by mouth every 7 (seven) days.    Dispense:  15 capsule    Refill:  0Tat Momoli DO 03/08/2016, 9:34 AM PGY-3, [place]

## 2016-03-08 NOTE — Patient Instructions (Addendum)
Appointment with Dr. Gwenlyn Saran next Friday(10/13) at 8:30am. Her number is [phone] if you need to cancel for any reason.  Follow-up with me in 3 weeks or sooner if needed.   Taking the medicine as directed and not missing any doses is one of the best things you can do to treat your depression.  Here are some things to keep in mind:  1) Side effects (stomach upset, some increased anxiety) may happen before you notice a benefit.  These side effects typically go away over time. 2) Changes to your dose of medicine or a change in medication all together is sometimes necessary 3) Most people need to be on medication at least 6-12 months 4) Many people will notice an improvement within two weeks but the full effect of the medication can take up to 4-6 weeks 5) Stopping the medication when you start feeling better often results in a return of symptoms 6) If you start having thoughts of hurting yourself or others after starting this medicine, please call me at [phone] immediately.     Major Depressive Disorder Major depressive disorder is a mental illness. It also may be called clinical depression or unipolar depression. Major depressive disorder usually causes feelings of sadness, hopelessness, or helplessness. Some people with this disorder do not feel particularly sad but lose interest in doing things they used to enjoy (anhedonia). Major depressive disorder also can cause physical symptoms. It can interfere with work, school, relationships, and other normal everyday activities. The disorder varies in severity but is longer lasting and more serious than the sadness we all feel from time to time in our lives. Major depressive disorder often is triggered by stressful life events or major life changes. Examples of these triggers include divorce, loss of your job or home, a move, and the death of a family member or close friend. Sometimes this disorder occurs for no obvious reason at all. People who have  family members with major depressive disorder or bipolar disorder are at higher risk for developing this disorder, with or without life stressors. Major depressive disorder can occur at any age. It may occur just once in your life (single episode major depressive disorder). It may occur multiple times (recurrent major depressive disorder). SYMPTOMS People with major depressive disorder have either anhedonia or depressed mood on nearly a daily basis for at least 2 weeks or longer. Symptoms of depressed mood include:  Feelings of sadness (blue or down in the dumps) or emptiness.  Feelings of hopelessness or helplessness.  Tearfulness or episodes of crying (may be observed by others).  Irritability (children and adolescents). In addition to depressed mood or anhedonia or both, people with this disorder have at least four of the following symptoms:  Difficulty sleeping or sleeping too much.   Significant change (increase or decrease) in appetite or weight.   Lack of energy or motivation.  Feelings of guilt and worthlessness.   Difficulty concentrating, remembering, or making decisions.  Unusually slow movement (psychomotor retardation) or restlessness (as observed by others).   Recurrent wishes for death, recurrent thoughts of self-harm (suicide), or a suicide attempt. People with major depressive disorder commonly have persistent negative thoughts about themselves, other people, and the world. People with severe major depressive disorder may experiencedistorted beliefs or perceptions about the world (psychotic delusions). They also may see or hear things that are not real (psychotic hallucinations). DIAGNOSIS Major depressive disorder is diagnosed through an assessment by your health care provider. Your health care provider will ask  aboutaspects of your daily life, such as mood,sleep, and appetite, to see if you have the diagnostic symptoms of major depressive disorder. Your health  care provider may ask about your medical history and use of alcohol or drugs, including prescription medicines. Your health care provider also may do a physical exam and blood work. This is because certain medical conditions and the use of certain substances can cause major depressive disorder-like symptoms (secondary depression). Your health care provider also may refer you to a mental health specialist for further evaluation and treatment. TREATMENT It is important to recognize the symptoms of major depressive disorder and seek treatment. The following treatments can be prescribed for this disorder:   Medicine. Antidepressant medicines usually are prescribed. Antidepressant medicines are thought to correct chemical imbalances in the brain that are commonly associated with major depressive disorder. Other types of medicine may be added if the symptoms do not respond to antidepressant medicines alone or if psychotic delusions or hallucinations occur.  Talk therapy. Talk therapy can be helpful in treating major depressive disorder by providing support, education, and guidance. Certain types of talk therapy also can help with negative thinking (cognitive behavioral therapy) and with relationship issues that trigger this disorder (interpersonal therapy). A mental health specialist can help determine which treatment is best for you. Most people with major depressive disorder do well with a combination of medicine and talk therapy. Treatments involving electrical stimulation of the brain can be used in situations with extremely severe symptoms or when medicine and talk therapy do not work over time. These treatments include electroconvulsive therapy, transcranial magnetic stimulation, and vagal nerve stimulation.   This information is not intended to replace advice given to you by your health care provider. Make sure you discuss any questions you have with your health care provider.   Document Released:  09/14/2012 Document Revised: 06/10/2014 Document Reviewed: 09/14/2012 Elsevier Interactive Patient Education [employer].

## 2016-03-08 NOTE — Assessment & Plan Note (Signed)
Jenna Mcclure is neatly groomed and appropriately dressed.  She maintains good eye contact and is cooperative and attentive.  Speech is normal in tone, rate and rhythm.  Mood is sad with a consistent affect.  Thought process is logical and goal directed.  No evidence of suicidal or homicidal ideation.  Does not appear to be responding to any internal stimuli.  Able to maintain train of thought and concentrate on the questions.  Judgment and insight are average to above average.  Describes herself as the "strong one" - the one that everyone goes to for encouragement and to get things done.  Is not in the habit of asking for help.  Has been putting on a brave face for mother and children.  I think ultimately, being more genuine with her emotions and experiences with those that love her and that she loves gives her a better chance at health but this might be a hard sell.  Will attempt to use her analogy of "breaking."  She has hardened herself around others in an effort to be "strong" but ultimately, that has lead (at least in part) to her getting to an unhealthy place.  I think an increase in Cymbalta to an antidepressant level makes good sense.  There is no pill that will touch dealing with a mother with life-threatening cancer.  She recognizes this and is open to counseling.  Provided options and she elected to come here.  Will follow next Friday in [place] clinic.

## 2016-03-08 NOTE — Progress Notes (Signed)
Dr. Gerarda Fraction requested a Jenna Mcclure.   Presenting Issue:  Patient has had an increase in stress and depressive symptoms since at least August.  Was not yet ready to do anything.  Hit a "breaking point" today and asked for a same day visit.    Report of symptoms:  Difficulty sleeping, eating, feeling down and overwhelmed.  Currently feels guilty (seems excessive) about "lashing out" at her kids this morning.  PHQ-9 was 22.  Prior to 01/25/16, had been 0.     Duration of CURRENT symptoms:  Since August.  Likely before then but masking them.    Age of onset of first mood disturbance:  Post partum depression around age 29.  Impact on function:  Current impact is such that she is requesting FMLA.  She thinks she needs a bit of time off of work ([employer]) to "get [herself] together."  With five children, a mom in the hospital recovering from major surgery and trying to deal with caner, and a full-time job, she is stretched incredibly thin and emotionally, right now, unable to cover all of the bases.    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    - Hospitalizations: Denied. - Pharmacotherapy: Cymbalta for post-partum depression.  Currently taking Cymbalta for neuropathic pain.   - Outpatient therapy: A counseling agency in [facility] when experiencing Post partum depression.  Family history of psychiatric issues:  Denied a history of any mental health issues.    Current and history of substance use:  Per Dr. Gerarda Fraction, no substance use.    Medical conditions that might explain or contribute to symptoms:  Medical record reviewed.    PHQ-9:  22 GAD-7:  Did not get. MDQ (if indicated):  Did not get.

## 2016-03-15 ENCOUNTER — Ambulatory Visit (INDEPENDENT_AMBULATORY_CARE_PROVIDER_SITE_OTHER): Payer: Managed Care, Other (non HMO) | Admitting: Psychology

## 2016-03-15 ENCOUNTER — Other Ambulatory Visit: Payer: Self-pay | Admitting: Obstetrics and Gynecology

## 2016-03-15 DIAGNOSIS — F322 Major depressive disorder, single episode, severe without psychotic features: Secondary | ICD-10-CM

## 2016-03-15 MED ORDER — DULOXETINE HCL 60 MG PO CPEP: 60.0000 mg | ORAL_CAPSULE | Freq: Every day | ORAL | 3 refills | Status: DC

## 2016-03-15 NOTE — Progress Notes (Signed)
Reason for follow-up:  Patient was seen last week as part of a warm hand-off.  PHQ-9 was 22.  Mom has advanced cancer and patient is dealing with this as well as raising 6 children and working.  She was not "herself" and has not been for some time.  She was written out of work a week ago and her Cymbalta, which she was taking for pain, was increased to 40 mg.  Issues discussed:  Medication:  Started the increased dose 6 days ago.  No difficulty tolerating it.  Thinks it has helped with her mood some.  Interested in increasing it if this is reasonable.  Out of work:  Has used the time to rest, care for her mother, reconnect with her children, and restart her independent bible study and also attend church.  The latter two things are seen as central to her health and well-being and one thing that she stopped doing when her stress level increased.  Getting back to her "self:"  She reports she is in a better place but still lacks the "energy" and "positivity" that she typically has.  Of note, she disclosed that in May of this past year, she learned that her husband had an extramarital affair two months prior.  She has decided to remain in the marriage but this has taken a significant toll on her.    Identified goals: - Maintain bible self-study and church attendance even after starting back to work. - Try to maintain healthy distance from mom's health issues - not getting too reactive over her mom's behavior when it comes to eating and drinking. - Continue asking for (and demanding) help when needed.

## 2016-03-15 NOTE — Patient Instructions (Addendum)
I will plan to follow-up with you after your appointment with Dr. Gerarda Fraction next Friday.    Daily bible study and getting to church regularly sound like the biggest things to help get you back to your self.  Scheduling pleasant activities is important for your mental health.  This is your mom's path.  You can support her and encourage her and you can't do it for her.  Understanding your limits is important to your mental health.

## 2016-03-15 NOTE — Assessment & Plan Note (Signed)
PHQ-9 down 8 points.  Likely hit her at a very bad moment last week so some of this is a rebound, some could be medication related, time off of work is likely helping, and finally doing something for herself.    Dr. Gerarda Fraction thought it reasonable to increase the Cymbalta to 60 mg.  I told her if it felt like too much medicine, she could go back to 40 mg but I expected her to tolerate it in fine.    I will plan to see her after her follow-up with Dr. Gerarda Fraction next Friday.  One thing I hope to address is engagement with mom while letting go of the outcome.    The relationship with her husband is in a tough place right now and it is not her priority given her mother's health.  Will continue to monitor.

## 2016-03-22 ENCOUNTER — Ambulatory Visit (INDEPENDENT_AMBULATORY_CARE_PROVIDER_SITE_OTHER): Payer: Managed Care, Other (non HMO) | Admitting: Obstetrics and Gynecology

## 2016-03-22 ENCOUNTER — Ambulatory Visit (INDEPENDENT_AMBULATORY_CARE_PROVIDER_SITE_OTHER): Payer: Managed Care, Other (non HMO)

## 2016-03-22 VITALS — BP 133/85 | HR 70 | Temp 98.3°F | Wt 163.8 lb

## 2016-03-22 DIAGNOSIS — F322 Major depressive disorder, single episode, severe without psychotic features: Secondary | ICD-10-CM | POA: Diagnosis not present

## 2016-03-22 NOTE — Assessment & Plan Note (Signed)
PHQ-9 of 20 today.  Report of symptoms remain consistent with major depressive disorder.  Focused on some problem-solving today and also making sure she has as good of a foundation as possible.  Regular sleep, eating, and activity schedules were discussed as well as engaging in "pleasurable" activities even if she doesn't feel like.  Avoidance of these activities is not helpful.  She seemed motivated at session end.  Will follow again next week.  A follow-up phone call in between might prove useful for accountability sake.

## 2016-03-22 NOTE — Patient Instructions (Addendum)
I have scheduled you a follow-up with Dr. Gerarda Fraction on 10/27 at 8:30.  I will see you after that.    With regards to your mom - saying what you need to say and letting go of the outcome - which is just another way of saying your mom is ultimately in charge of what she does.  Sleep / Eat / Move cycles are so important right now.  It is hard to keep them in line but it is an important foundation.  5 minute rule regarding moving your body.  Set your timer for five minutes - if you are still feeling like moving is the worse thing ever, you can stop.  Most of the time people want to keep moving at that point.  The video I spoke to you about:  Jenna Mcclure - rethinking infidelity_[email or web address]_[email or web address]_loved.  Good luck tonight.  Happy birthday to your newly minted teenager.

## 2016-03-22 NOTE — Patient Instructions (Addendum)
We will continue to work on optimizing therapy May need to switch medications Continue Cymbalta 60mg . Will reaccess next week for need to change medication  May follow-up appointment in a week.

## 2016-03-22 NOTE — Assessment & Plan Note (Addendum)
PHQ-9 today was 20 which is down from initial encounter of 22 but up from a week ago. Patient still having difficulty with managing depression. Not seeing full benefits of therapy yet. Will continue Cymbalta 60mg  at this time since was just dose increased a week ago from 40mg . If no improvement in a week will switch to an SSRI. She feels that depression is a negative thing; spent time addressing her expectations. She has follow-up appointment with Dr. Gwenlyn Saran after today's visit for further counseling. She finds this very helpful and useful. Time spent this visit supporting patient and general counseling. Follow-up in a week. Will keep patient out of work until we can find the right treatment to where she is stable enough to perform work duties.

## 2016-03-22 NOTE — Progress Notes (Signed)
     Subjective: Chief Complaint  Patient presents with  . Follow-up    Depression     HPI: Jenna Mcclure is a 35 y.o. presenting to clinic today to discuss the following:  #Follow-up -recently diagnosed MDD, single severe episode -continues to endorse depressed mood -cymbalta was increased to 60mg  a week ago. She has not felt any differences -states she does not feel as bad as last visit as that was her breaking point -in interim she was seen by Dr. Gwenlyn Saran -continues to endorse no appetitie and sleeping a lot -Feel like she is walking around like a zombie -Does not feel like herself but has desire to try and get better -Short term disability approved  -admits to having multiple life factors affecting recent episode with mother, kids, close aunt passing from cancer, marital issues -states she did not mention it last visit but had thoughts of "just driving away and not caring what happens" -denies SI/HI today -has been taking more time for herself lately   ROS noted in HPI.  Past Medical, Surgical, Social, and Family History Reviewed & Updated per EMR. Smoking status - Never smoker   Objective: BP 133/85 (BP Location: Left Arm, Patient Position: Sitting, Cuff Size: Normal)   Pulse 70   Temp 98.3 F (36.8 C) (Oral)   Wt 163 lb 12.8 oz (74.3 kg)   LMP 03/22/2016 (Exact Date)   SpO2 100%   BMI 29.96 kg/m  Vitals and nursing notes reviewed  Physical Exam  Constitutional: She is well-developed, well-nourished, and in no distress.  Cardiovascular: Normal rate.   Pulmonary/Chest: Effort normal.  Psychiatric: She exhibits a depressed mood. She expresses no homicidal and no suicidal ideation. She has a flat affect.     Depression screen [facility] 2/9 03/22/2016 03/15/2016 03/08/2016  Decreased Interest 3 3 3   Down, Depressed, Hopeless 3 2 3   PHQ - 2 Score 6 5 6   Altered sleeping 3 3 3   Tired, decreased energy 3 3 3   Change in appetite 3 0 3  Feeling bad or failure about  yourself  2 1 2   Trouble concentrating 2 1 3   Moving slowly or fidgety/restless 1 0 2  Suicidal thoughts 0 - 0  PHQ-9 Score 20 13 22   Difficult doing work/chores Somewhat difficult Very difficult Somewhat difficult    Assessment/Plan: Please see problem based Assessment and [place], DO 03/22/2016, 8:40 AM PGY-3, [place]

## 2016-03-22 NOTE — Progress Notes (Signed)
Reason for follow-up:  Continued mood management in the face of acute stressors.    Issues discussed:  [name] (35 year old son):  Busy.  Hyper.  Getting calls from school.  Behavior like this at home too.  Denies significant impulsivity, aggression, defiance (some of this reported at school).  Also notes continued bed wetting.    Jaelyn (7 year old son):  Birthday today.  Has a birthday party scheduled tonight and she is feeling anxious about it.  Discussed / problem-solved.  Mom:  Has "detached" a bit but ends up feeling guilty.  Looked at a possible way to manage this.  Marriage:  No time / energy / motivation to work on Publishing rights manager given other significant stressors.  Talked about a Clare Gandy Talk that might be interesting.  Also suggested now might not be the time and that is okay.    Sleep / Eating / Movement:  She is sleeping A LOT because she is exhausted emotionally.  Might also be using it as an escape.  Not eating regularly.   Identified goals:  [name]:  If this continues to be a problem, ask to see one of our BHCs that work with children a lot.  We can assess further.  Older brother Tessie Fass) apparently had both bedwetting and hyperactivity and no longer does.  So waiting and watching is an option too.  Jaelyn:  Scripted a response to probing questions from people at the party.  Also, will take a specific moment to focus on the reason for this party - Jaelyn's birthday.  Look for the joy in him.  Mom:  Detachment leaves her feeling like she is not doing enough.  Can say what she needs to say kindly, respectfully AND let go of the outcome.  Marriage:  She elected to take the info for the video.  Discussed.  Sleep / Eating / Movement:  She identified taking walks as a goal.  Did not drill down on this secondary to time.

## 2016-03-25 ENCOUNTER — Telehealth: Payer: Self-pay | Admitting: Obstetrics and Gynecology

## 2016-03-25 NOTE — Telephone Encounter (Signed)
[employer] is sending over extension forms today. Pt said she was supposed to let Dr. Gerarda Fraction know what she wanted her return to work day to be for short term disability, pt says either November 3rd or November 6th, whichever one you think is best. Please advise. Thanks! ep

## 2016-03-27 NOTE — Telephone Encounter (Signed)
Completed today and will be placed in pile to fax to insurance company.

## 2016-03-29 ENCOUNTER — Encounter: Payer: Self-pay | Admitting: Obstetrics and Gynecology

## 2016-03-29 ENCOUNTER — Ambulatory Visit (INDEPENDENT_AMBULATORY_CARE_PROVIDER_SITE_OTHER): Payer: Managed Care, Other (non HMO) | Admitting: Obstetrics and Gynecology

## 2016-03-29 ENCOUNTER — Ambulatory Visit (INDEPENDENT_AMBULATORY_CARE_PROVIDER_SITE_OTHER): Payer: Managed Care, Other (non HMO) | Admitting: Psychology

## 2016-03-29 VITALS — BP 139/95 | HR 76 | Temp 98.8°F | Wt 164.0 lb

## 2016-03-29 DIAGNOSIS — L723 Sebaceous cyst: Secondary | ICD-10-CM

## 2016-03-29 DIAGNOSIS — F322 Major depressive disorder, single episode, severe without psychotic features: Secondary | ICD-10-CM

## 2016-03-29 DIAGNOSIS — G510 Bell's palsy: Secondary | ICD-10-CM | POA: Diagnosis not present

## 2016-03-29 MED ORDER — SERTRALINE HCL 50 MG PO TABS
50.0000 mg | ORAL_TABLET | Freq: Every day | ORAL | 0 refills | Status: DC
Start: 2016-03-29 — End: 2016-10-09

## 2016-03-29 NOTE — Assessment & Plan Note (Signed)
PHQ-9 continues to be 20. No improvement seen within the last two weeks. Did endorse SI at this visit but no plan. Will discontinue Cymbalta and start Zoloft at this time. Increase dosage by 25mg  weekly until noticing effect. Discussed with patient side effects of medication. Suicide hotline number given and patient encouraged to reach out when overwhelmed. Patient to follow-up in a week. Plan to return to work on Nov. 6; may need to start back part-time as I feel full time would be to overwhelming for patient at this point but structure may be good for her. Counseled patient on expectations with MDD. Follow-up with Dr. Gwenlyn Saran as scheduled.

## 2016-03-29 NOTE — Progress Notes (Signed)
Reason for follow-up:  Continued treatment of a major depressive episode.  Issues discussed: - Had a big fight with her husband on an issue related to his infidelity.  - She attended a family party where she felt like she did not belong - everyone was happy and she was so sad.   - Attended a doctor's appointment with her mother.  Microscopic cancer cells remain but they will defer further treatment for 3 or more months to give her mom's body time to heal from the surgery.  Slow growing cancer so physician thinks okay to wait.  Identified goals:  - See patient instructions for one's mapped out moving forward.

## 2016-03-29 NOTE — Progress Notes (Signed)
     Subjective: Chief Complaint  Patient presents with  . Follow-up    mood      HPI: Jenna Mcclure is a 35 y.o. presenting to clinic today to discuss the following:  #Depression: -recently diagnosed MDD, single severe episode -no prior history of mental health issues -states she has had a rough week -Felt weird around people when they were happy at an event this past week -Felt they would be better off without her -Wondering when she is going to feel better -Mom now s/p surgery- states they got most of the cancer but still some microscopic areas. She will be starting chemo -No SI plans, No HI -continues to endorse depressed mood -feels like it is time to switch medications  -follows with Dr. Gwenlyn Saran  #Facial nerve palsy -no longer endorsing pain or numbness - imaging was normal  #Lump under arm -went to surgeon who said nothing to do -states it gets bigger when she is menstrating -also has associated breast tenderness -no draingage, erythema, or warmth around area -concerned because of appearance   ROS noted in HPI.  Past Medical, Surgical, Social, and Family History Reviewed & Updated per EMR. Smoking status - Never smoker   Objective: BP (!) 139/95   Pulse 76   Temp 98.8 F (37.1 C) (Oral)   Wt 164 lb (74.4 kg)   LMP 03/22/2016 (Exact Date)   SpO2 100%   BMI 30.00 kg/m  Vitals and nursing notes reviewed  Physical Exam  Constitutional: She is well-developed, well-nourished, and in no distress.  Cardiovascular: Normal rate, regular rhythm and normal heart sounds.   Pulmonary/Chest: Effort normal and breath sounds normal.  Musculoskeletal:  R. Axilla with no palpable masses or tenderness. Prominent area of increased tissue appreciated.   Psychiatric: She exhibits a depressed mood. She expresses suicidal ideation. She expresses no homicidal ideation. She expresses no suicidal plans. She has a flat affect.     Depression screen [facility] 2/9 03/29/2016 03/22/2016  03/15/2016  Decreased Interest 3 3 3   Down, Depressed, Hopeless 3 3 2   PHQ - 2 Score 6 6 5   Altered sleeping 3 3 3   Tired, decreased energy 3 3 3   Change in appetite 3 3 0  Feeling bad or failure about yourself  2 2 1   Trouble concentrating 0 2 1  Moving slowly or fidgety/restless 2 1 0  Suicidal thoughts 1 0 -  PHQ-9 Score 20 20 13   Difficult doing work/chores Somewhat difficult Somewhat difficult Very difficult    Assessment/Plan: Please see problem based Assessment and [place], DO 03/29/2016, 8:32 AM PGY-3, [place]

## 2016-03-29 NOTE — Assessment & Plan Note (Signed)
Resolved. MRI of head was normal.

## 2016-03-29 NOTE — Assessment & Plan Note (Signed)
Report of mood is depressed.  Affect is variable and within normal limits.  She smile brightly on occasion.  Thoughts are consistent with major depression:  Negative view of self, world, and future are evident.  Provided some psychoeducation around this and explained the role of tracking thoughts and setting limits around them.    One thought that I addressed today a bit and may need to revisit:  "I don't know why I'm not happy."  She cites prior challenging times when she was able to remain positive and optimistic.  Given the number of serious stressors she has endured recently, feeling unhappy is not surprising.  I do believe she has a major depressive disorder as well.  Repeatedly falling short of the expectation that she should be "happy" is not helpful.  She cited at least two times in the last week where she DID feel happy.  I asked her to celebrate those times.  Will follow next Thursday.

## 2016-03-29 NOTE — Patient Instructions (Signed)
Changed medication to Zoloft. Starting at 50mg  but can increase weekly. If no changes in a week call in and will increase dose Seek help if you are having thoughts of harming yourself. You can always call out on call doctor or the number below:  [place] Call [phone] Available 24 hours everyday  Follow-up next week prior to returning to work.

## 2016-03-29 NOTE — Assessment & Plan Note (Signed)
Cyst no longer appreciated under R. Axilla. However this is prominent tissue there that is easily reducible. Feel like the discomfort patient feels with menstrual periods could be hormonal since she has the associated breast tenderness as well. Will continue to monitor. No signs of infection or irritation.

## 2016-03-29 NOTE — Progress Notes (Deleted)
Rough week Believes she needs  Felt weird around people when they were happy at an [employer] they would be better - worse this week Wondering when she is going to \\feel  better Mom surgery they got most of the cancer but still some microscopic No SI plans   Facial numbness gone away

## 2016-03-29 NOTE — Patient Instructions (Addendum)
Please schedule a follow-up for:  November 2nd at 9:30.  Call in between with any difficulties.  GREAT JOB getting out of bed today.    You are in a dark spot now.  I expect it will get better.  There are some things you can do to help make it better:  1.  Walking - you have found this is helpful.  Remember - even if you don't feel like doing it, try it for five minutes.  Almost always, you will end up feeling more motivated.  2.  Set limits with the bed.  It is for night time sleep only.    3.  When you are feeling overwhelmed, ask yourself  - What thoughts are going through my head right now.  Write those down.  - Is this thought true?  - Can I absolutely know that this thought is true?    - How would I feel if I didn't have this thought?  What would be different?

## 2016-04-04 ENCOUNTER — Ambulatory Visit (INDEPENDENT_AMBULATORY_CARE_PROVIDER_SITE_OTHER): Payer: Managed Care, Other (non HMO) | Admitting: Psychology

## 2016-04-04 DIAGNOSIS — F322 Major depressive disorder, single episode, severe without psychotic features: Secondary | ICD-10-CM

## 2016-04-04 NOTE — Progress Notes (Signed)
Reason for follow-up:  Continued therapy to address acute and chronic stressors as well as Major Depression.  Issues discussed:  Medication:  Patient stopped Cymbalta and started Sertaline 50 mg six days ago.  She reports taking it every morning (no missed doses).  She notices some poor appetite and stomach upset.  Also headaches but not enough to treat with medicine.  She thinks she notices more energy and more "positivity."  From a functional standpoint, she reports cleaning more, doing more bible study in the evening.  She is also getting geared up for a return to work.    Work:  Returns Monday full time.  She is feeling anxious.  Discussed.  Relationship:  She doesn't know what she wants or how she feels.  Has utilized thought techniques to help her catastrophic thoughts in this regard.  "I've been working really hard."  Identified goals:  Create reasonable expectations for return to work.  Identify consistent time for bible study (? Lunch break) and walking (? 15 minute work break twice a day).  Consider experimenting with activities with your husband.

## 2016-04-04 NOTE — Patient Instructions (Signed)
Please schedule a follow-up for:  I am glad you are tolerating the medicine well.  You said you planned to increase to 75 mg tomorrow.  I think you will continue to feel better.  Great job with the other strategies including time for your faith and moving your body and direct, honest conversations with your mom.  All of those things will help you move in a positive direction.  Regarding work, keep in mind - expectations.  You are climbing your way out of a hole and are not yet at the smooth sailing part.  So expect a bumpy time and be pleasantly surprised if it is easier than you anticipate.  Consider other times to get your bible study in after you return to work.  You have clearly identified this as one of the most important things to maintain your well-being.  With regards to your marriage.  This is tough.  Consider experimenting with your relationship as long as you have honest communication.  You may learn some important things about yourself and your marriage.

## 2016-04-04 NOTE — Assessment & Plan Note (Signed)
Patient believes medication has led to some improvement in symptoms.  Her affect was significantly brighter until we started talking about her relationship with her husband.  This is weighing so heavily on her and yet she hasn't been able to attend to it.  Seems like modest improvement in symptomatology with PHQ-9 down 5 points.  Might be more difficult to follow-up with a return to work.  She plans to increase her medicine 25 mg starting tomorrow - per Dr. Gerarda Fraction earlier recommendation.  I asked her to follow-up with me when she gets her schedule and to call as needed.

## 2016-05-09 ENCOUNTER — Telehealth: Payer: Self-pay | Admitting: Psychology

## 2016-05-09 NOTE — Telephone Encounter (Signed)
It has been over a month since I saw Jenna Mcclure.  She was scheduled to return to work.  Called just to check in and offer support.  Left a generic VM.

## 2016-06-03 DIAGNOSIS — F32A Depression, unspecified: Secondary | ICD-10-CM

## 2016-06-03 HISTORY — DX: Depression, unspecified: F32.A

## 2016-06-13 ENCOUNTER — Telehealth: Payer: Self-pay | Admitting: Psychology

## 2016-06-13 NOTE — Telephone Encounter (Signed)
Patient called back.  Reported that the physicians have given her mother days to live.  Being transferred from [facility] to [facility] today.  Shamirah reports that she had a major "breakdown" yesterday.  Got very frustrated with the husband of her mother who is now wanting to be more involved.    Patient was very tearful on the phone.  Expressing concerns about being able to handle this.  Has an appointment with Dr. Gerarda Fraction at 10:30 tomorrow.  Would like to see me as well.  Scheduled for 9:45.

## 2016-06-13 NOTE — Telephone Encounter (Signed)
Patient called and left a VM stating she was not doing well.  Called her back and left a VM.

## 2016-06-14 ENCOUNTER — Ambulatory Visit (INDEPENDENT_AMBULATORY_CARE_PROVIDER_SITE_OTHER): Payer: Self-pay | Admitting: Psychology

## 2016-06-14 ENCOUNTER — Encounter: Payer: Self-pay | Admitting: Obstetrics and Gynecology

## 2016-06-14 ENCOUNTER — Ambulatory Visit (INDEPENDENT_AMBULATORY_CARE_PROVIDER_SITE_OTHER): Payer: BLUE CROSS/BLUE SHIELD | Admitting: Obstetrics and Gynecology

## 2016-06-14 VITALS — BP 126/88 | HR 79 | Temp 98.5°F | Ht 62.0 in | Wt 160.6 lb

## 2016-06-14 DIAGNOSIS — F322 Major depressive disorder, single episode, severe without psychotic features: Secondary | ICD-10-CM

## 2016-06-14 DIAGNOSIS — Z659 Problem related to unspecified psychosocial circumstances: Secondary | ICD-10-CM

## 2016-06-14 MED ORDER — CLONAZEPAM 0.5 MG PO TABS: 0.5000 mg | ORAL_TABLET | Freq: Three times a day (TID) | ORAL | 0 refills | Status: DC | PRN

## 2016-06-14 MED ORDER — PANTOPRAZOLE SODIUM 20 MG PO TBEC: 20.0000 mg | DELAYED_RELEASE_TABLET | Freq: Every day | ORAL | 3 refills | Status: DC

## 2016-06-14 MED ORDER — ONDANSETRON 4 MG PO TBDP: 4.0000 mg | ORAL_TABLET | Freq: Three times a day (TID) | ORAL | 0 refills | Status: DC | PRN

## 2016-06-14 NOTE — Assessment & Plan Note (Addendum)
Sounds like patient's depression was largely resolved when she got this news.  Current presentation sounds like stress associated with a very challenging life event (in the context of a recent major depression).  Educated patient about flight or flight response and how this situation is emotionally threatening but ultimately something that she can manage.  In consultation with Dr. Gerarda Fraction, discussed the role of medication in dampening down the Fight or Flight response in order to allow for greater function.  Not intended to dull her emotions or keep her from experiencing the pain of losing her mother.  Patient voiced an understanding.  Side effects were discussed.  Also discussed possibility of increasing Zoloft.  Current dose is 50 mg.  She will discuss with Dr. Gerarda Fraction.    Self care:  Eating regular meals is important.  Discussed work situation.  Going back to work is important both financially and for structure / distraction.  Hard to determine what is best given mom's current health condition.  Thia decided that taking at least next week off was wise and perhaps can reassess after that.    Cognitions:  Looked at how she is talking to herself about this situation and where she might be able to challenge some of her thinking in order to cope better.    Will follow in one week.  She can call in the meantime if necessary.

## 2016-06-14 NOTE — Assessment & Plan Note (Addendum)
Depression was improving but may be worsening again vs grief reaction from stressful life event. Discussed treatment options with patient. It was agreed upon since patient was doing well on Zoloft dose of 50 mg we will maintain that dose at this time. Rx for Klonopin added 0.5 mg twice a day. Instructed patient to take this medication scheduled versus when necessary this may be easier for her to maintain her symptoms. Side effects discussed. Patient to follow-up in one week. Will continue to monitor and support patient as I feel her symptoms may get worse before getting better. [place] appointment prior to our appointment. Please see Dr. Tod Persia note. Patient to continue to work with them through this process as well. Will fill out any FMLA paperwork during this grieving process.

## 2016-06-14 NOTE — Progress Notes (Signed)
Reason for follow-up:  Patient was previously seen in [place] for depression.  Started on Zoloft and with some brief therapy, she reports she returned nearly to baseline.  Mother had diagnosis of cancer for some time but recently, her health declined significantly.  Admitted to [facility] yesterday.  Issues discussed: Symptoms:  Reports not eating since Sunday.  Can't sleep.  Tearful.  On edge.  Emotional outbursts.  Thoughts that she can't handle this.  Denies suicidal ideation.  Thoughts of "giving up" which means not getting out of bed.  Strengths / Positives:  Good support from husband and other family members.  Mom is at peace with dying.  Yesterday was "happy."  Kyra Searles is very strong for both [place] and her mother.  Lavonn's work is supportive.  Challenges / Negatives:  Today mom reports not feeling comfortable at [facility].  Caring for her at home is not an option for [facility].  This is a difficult tension.  Contentious relationship with her step-dad.  Finances are tough - Nhyira's husband is not working.    Identified goals:  Be present for mother during this challenging time.  Take care of herself.

## 2016-06-14 NOTE — Assessment & Plan Note (Signed)
Continues to have a lot of social stressors in life that are exacerbating her depression. Appears current mood concerns are due to a very challenging life event. Spent time today talking with Dr. Gwenlyn Saran about ways to better manage these events and stress.

## 2016-06-14 NOTE — Patient Instructions (Signed)
Schedule a follow-up for 06/21/2016 at 8:30.  Call me at [phone] if you can't make this appointment.  You can do hard things.

## 2016-06-14 NOTE — Progress Notes (Signed)
     Subjective: Chief Complaint  Patient presents with  . Depression     HPI: Jenna Mcclure is a 36 y.o. presenting to clinic today to discuss the following:  #Depression Patient presents for follow-up on depression. She states that she was doing well over the holidays. Zoloft dose was working. States that she felt like she is getting back to her regular self. However after the holidays received news that her mother had less than 6 months to live and now a matter of days. Patient states that she had an episode of anxiety and panic and had a syncopal episode in the hospital when mother's prognosis was discussed. She feels that she is unable to handle the stress of her mother passing.   #Social stressor Mother now with days to live. Patient is not coping well with life event.   No other concerns voiced today PMH insignificant     ROS noted in HPI.  Past Medical, Surgical, Social, and Family History Reviewed & Updated per EMR. History  Smoking Status  . Never Smoker  Smokeless Tobacco  . Never Used    Objective: BP 126/88 (BP Location: Left Arm, Patient Position: Sitting, Cuff Size: Normal)   Pulse 79   Temp 98.5 F (36.9 C) (Oral)   Ht 5\' 2"  (1.575 m)   Wt 160 lb 9.6 oz (72.8 kg)   LMP 05/17/2016 (Exact Date)   SpO2 98%   BMI 29.37 kg/m  Vitals and nursing notes reviewed  Physical Exam  Cardiovascular: Normal rate.   Pulmonary/Chest: Effort normal.  Psychiatric: Her mood appears anxious. Her affect is labile. She exhibits a depressed mood. She expresses no homicidal and no suicidal ideation.  tearful    Assessment/Plan: Please see problem based Assessment and Plan PATIENT EDUCATION PROVIDED: See AVS    Meds ordered this encounter  Medications  . clonazePAM (KLONOPIN) 0.5 MG tablet    Sig: Take 1 tablet (0.5 mg total) by mouth 3 (three) times daily as needed for anxiety.    Dispense:  20 tablet    Refill:  0  . pantoprazole (PROTONIX) 20 MG tablet    Sig:  Take 1 tablet (20 mg total) by mouth daily.    Dispense:  30 tablet    Refill:  3  . ondansetron (ZOFRAN-ODT) 4 MG disintegrating tablet    Sig: Take 1 tablet (4 mg total) by mouth every 8 (eight) hours as needed for nausea or vomiting.    Dispense:  20 tablet    Refill:  [place], DO 06/14/2016, 3:47 PM PGY-3, [place]

## 2016-06-14 NOTE — Patient Instructions (Signed)
Schedule follow-up appointment for next week with me.  Call to get FMLA papers sent to me Medications sent to pharmacy for nausea, reflux, and anxiety  Please let me know if there is anything I can do for you during this difficult time   Complicated Grieving Introduction Grief is a normal response to the death of someone close to you. Feelings of fear, anger, and guilt can affect almost everyone who loses a loved one. It is also common to have symptoms of depression while you are grieving. These include problems with sleep, loss of appetite, and lack of energy. They may last for weeks or months after a loss. Complicated grief is different from normal grief or depression. Normal grieving involves sadness and feelings of loss, but these feelings are not constant. Complicated grief is a constant and severe type of grief. It interferes with your ability to function normally. It may last for several months to a year or longer. Complicated grief may require treatment from a mental health care provider. What are the causes? It is not known why some people continue to struggle with grief and others do not. You may be at higher risk for complicated grief if:  The death of your loved one was sudden or unexpected.  The death of your loved one was due to a violent event.  Your loved one committed suicide.  Your loved one was a child or a young person.  You were very close to or dependent on the loved one.  You have a history of depression. What are the signs or symptoms? Signs and symptoms of complicated grief may include:  Feeling disbelief or numbness.  Being unable to enjoy good memories of your loved one.  Needing to avoid anything that reminds you of your loved one.  Being unable to stop thinking about the death.  Feeling intense anger or guilt.  Feeling alone and hopeless.  Feeling that your life is meaningless and empty.  Losing the desire to live. How is this diagnosed? Your  health care provider may diagnose complicated grief if:  You have constant symptoms of grief for 6-12 months or longer.  Your symptoms are interfering with your ability to live your life. Your health care provider may want you to see a mental health care provider. Many symptoms of depression are similar to the symptoms of complicated grief. It is important to be evaluated for complicated grief along with other mental health conditions. How is this treated? Talk therapy with a mental health provider is the most common treatment for complicated grief. During therapy, you will learn healthy ways to cope with the loss of your loved one. In some cases, your mental health care provider may also recommend antidepressant medicines. Follow these instructions at home:  Take care of yourself.  Eat regular meals and maintain a healthy diet. Eat plenty of fruits, vegetables, and whole grains.  Try to get some exercise each day.  Keep regular hours for sleep. Try to get at least 8 hours of sleep each night.  Do not use drugs or alcohol to ease your symptoms.  Take medicines only as directed by your health care provider.  Spend time with friends and loved ones.  Consider joining a grief (bereavement) support group to help you deal with your loss.  Keep all follow-up visits as directed by your health care provider. This is important. Contact a health care provider if:  Your symptoms keep you from functioning normally.  Your symptoms do not  get better with treatment. Get help right away if:  You have serious thoughts of hurting yourself or someone else.  You have suicidal feelings. This information is not intended to replace advice given to you by your health care provider. Make sure you discuss any questions you have with your health care provider. Document Released: 05/20/2005 Document Revised: 10/26/2015 Document Reviewed: 10/28/2013  2017 Elsevier

## 2016-06-20 ENCOUNTER — Telehealth: Payer: Self-pay | Admitting: Psychology

## 2016-06-20 NOTE — Telephone Encounter (Signed)
Patient was scheduled for follow-up for Firday the 19th.  We are on a snow delay.  Called patient to see if she could come in a later time and to just check-in.  Left a VM.

## 2016-06-21 ENCOUNTER — Telehealth: Payer: Self-pay | Admitting: Obstetrics and Gynecology

## 2016-06-21 NOTE — Telephone Encounter (Signed)
Pt wanted to let Dr. Gerarda Fraction know that [employer] sent over [facility] paperwork.ep

## 2016-06-21 NOTE — Telephone Encounter (Signed)
Will forward to MD to make aware. Luay Balding,CMA  

## 2016-06-24 NOTE — Telephone Encounter (Signed)
Have not received any forms. Will continue to look for them.

## 2016-06-25 ENCOUNTER — Telehealth: Payer: Self-pay | Admitting: Psychology

## 2016-06-25 NOTE — Telephone Encounter (Signed)
Jenna Mcclure called and left a VM stating that her mother had died.  I called her back and left a VM.

## 2016-06-27 NOTE — Telephone Encounter (Signed)
Connected with the patient.  Scheduled an appointment to see her after her appointment with Dr. Gerarda Fraction on Friday morning.

## 2016-06-28 ENCOUNTER — Encounter: Payer: Self-pay | Admitting: Psychology

## 2016-06-28 ENCOUNTER — Ambulatory Visit (INDEPENDENT_AMBULATORY_CARE_PROVIDER_SITE_OTHER): Payer: BLUE CROSS/BLUE SHIELD | Admitting: Psychology

## 2016-06-28 ENCOUNTER — Ambulatory Visit (INDEPENDENT_AMBULATORY_CARE_PROVIDER_SITE_OTHER): Payer: BLUE CROSS/BLUE SHIELD | Admitting: Obstetrics and Gynecology

## 2016-06-28 ENCOUNTER — Encounter: Payer: Self-pay | Admitting: Obstetrics and Gynecology

## 2016-06-28 VITALS — BP 112/64 | HR 88 | Ht 62.0 in | Wt 162.0 lb

## 2016-06-28 DIAGNOSIS — F322 Major depressive disorder, single episode, severe without psychotic features: Secondary | ICD-10-CM

## 2016-06-28 DIAGNOSIS — G894 Chronic pain syndrome: Secondary | ICD-10-CM | POA: Diagnosis not present

## 2016-06-28 MED ORDER — CLONAZEPAM 0.5 MG PO TABS: 0.5000 mg | ORAL_TABLET | Freq: Two times a day (BID) | ORAL | 0 refills | Status: DC

## 2016-06-28 NOTE — Progress Notes (Signed)
     Subjective: Chief Complaint  Patient presents with  . Follow-up    depression     HPI: Jenna Mcclure is a 36 y.o. presenting to clinic today to discuss the following:  #Depression She presents for follow-up on depression. Have been following patient closely due to recent illness of mother with terminal cancer. Mother passed away earlier this week(Monday). Patient has been having a hard time dealing with this loss. She states that she feels like she cannot go on anymore. Feels tired all the time and overwhelmed. Denies SI/HI. FMLA paperwork sent over that needs to be filled out. Funeral on Monday.  Medications:  Last visit patient was started on Klonopin 0.5 mg twice a day and Zoloft maintained at 50 mg. Doing well on medication. Taking as instructed. Mentioned that she just wants to be sedated. sometimes.  Follows with Dr. Gwenlyn Saran after this visit; Dr. Gwenlyn Saran has been great support  #Generalized Pain Pain in throat(sore throat), arm, leg - right sided only Has had pain like this before Usually occurs around stressful events PMH significant for chronic pain syndrome Cymbalta had helped but discontinued due to it not helping with depression     ROS noted in HPI.  Past Medical, Surgical, Social, and Family History Reviewed & Updated per EMR. History  Smoking Status  . Never Smoker  Smokeless Tobacco  . Never Used    Objective: BP 112/64   Pulse 88   Ht 5\' 2"  (1.575 m)   Wt 162 lb (73.5 kg)   LMP 06/08/2016   SpO2 99%   BMI 29.63 kg/m  Vitals and nursing notes reviewed  Physical Exam  Constitutional: She is well-developed, well-nourished, and in no distress.  Cardiovascular: Normal rate and intact distal pulses.   Pulmonary/Chest: Effort normal.  Neurological: She is alert. She has normal sensation, normal strength and intact cranial nerves. Gait normal.  Psychiatric: Affect normal. She exhibits a depressed mood. She expresses no homicidal and no suicidal ideation.    Depression screen [facility] 2/9 06/28/2016 06/14/2016 06/14/2016 04/04/2016 03/29/2016  Decreased Interest 3 3 3 3 3   Down, Depressed, Hopeless 3 3 3 2 3   PHQ - 2 Score 6 6 6 5 6   Altered sleeping 3 3 - 3 3  Tired, decreased energy 3 3 - 2 3  Change in appetite 2 3 - 3 3  Feeling bad or failure about yourself  0 2 - 1 2  Trouble concentrating 3 3 - 0 0  Moving slowly or fidgety/restless 3 3 - 1 2  Suicidal thoughts 0 1 - 0 1  PHQ-9 Score 20 24 - 15 20  Difficult doing work/chores - Extremely dIfficult - Somewhat difficult Somewhat difficult  Some recent data might be hidden    Assessment/Plan: Please see problem based Assessment and Plan PATIENT EDUCATION PROVIDED: See AVS    Meds ordered this encounter  Medications  . clonazePAM (KLONOPIN) 0.5 MG tablet    Sig: Take 1 tablet (0.5 mg total) by mouth 2 (two) times daily.    Dispense:  28 tablet    Refill:  [place], DO 06/28/2016, 8:50 AM PGY-3, [place]

## 2016-06-28 NOTE — Patient Instructions (Signed)
Please follow-up on Friday, February 2nd at 8:30.  Continue to take your medicine as scheduled - not based on how you feel.  When emotionally overwhelmed, start with a breath - this will be hard to get because of how your body is working at the time.  It is important.  As you are getting back your breath, talk to yourself in helpful ways.  These can be things like:  This will pass.  It is hard now but I know it will ease up.  I can handle this.  Or anything that makes the most sense (is most helpful) to you.

## 2016-06-28 NOTE — Progress Notes (Signed)
Reason for follow-up:  Treatment for depression and anxiety related to major life stressors.  Patient's mother died this past Sep 30, 2022 from cancer.    Issues discussed:  She reports one "breakdown" since Sep 30, 2022 where she thought her husband might need to call mental health.  The emotions come and go. She reminds herself that she needs to feel the pain - that she can't escape it but it overwhelms her on occasion.  Family support remains good.  She worked out some financial concerns she had with her mom's husband.  Feels good about that.    Jenna Mcclure is this coming 09-30-22.

## 2016-06-28 NOTE — Patient Instructions (Signed)
Will fill out FMLA paperwork I think some of your physical complaints may be manifestation of your grief. Will reassess at next visit Continue medications. No changes today  Follow-up with me the week of Feb 12-16th; right before returning to work   Complicated Grieving Introduction Grief is a normal response to the death of someone close to you. Feelings of fear, anger, and guilt can affect almost everyone who loses a loved one. It is also common to have symptoms of depression while you are grieving. These include problems with sleep, loss of appetite, and lack of energy. They may last for weeks or months after a loss. Complicated grief is different from normal grief or depression. Normal grieving involves sadness and feelings of loss, but these feelings are not constant. Complicated grief is a constant and severe type of grief. It interferes with your ability to function normally. It may last for several months to a year or longer. Complicated grief may require treatment from a mental health care provider. What are the causes? It is not known why some people continue to struggle with grief and others do not. You may be at higher risk for complicated grief if:  The death of your loved one was sudden or unexpected.  The death of your loved one was due to a violent event.  Your loved one committed suicide.  Your loved one was a child or a young person.  You were very close to or dependent on the loved one.  You have a history of depression. What are the signs or symptoms? Signs and symptoms of complicated grief may include:  Feeling disbelief or numbness.  Being unable to enjoy good memories of your loved one.  Needing to avoid anything that reminds you of your loved one.  Being unable to stop thinking about the death.  Feeling intense anger or guilt.  Feeling alone and hopeless.  Feeling that your life is meaningless and empty.  Losing the desire to live. How is this  diagnosed? Your health care provider may diagnose complicated grief if:  You have constant symptoms of grief for 6-12 months or longer.  Your symptoms are interfering with your ability to live your life. Your health care provider may want you to see a mental health care provider. Many symptoms of depression are similar to the symptoms of complicated grief. It is important to be evaluated for complicated grief along with other mental health conditions. How is this treated? Talk therapy with a mental health provider is the most common treatment for complicated grief. During therapy, you will learn healthy ways to cope with the loss of your loved one. In some cases, your mental health care provider may also recommend antidepressant medicines. Follow these instructions at home:  Take care of yourself.  Eat regular meals and maintain a healthy diet. Eat plenty of fruits, vegetables, and whole grains.  Try to get some exercise each day.  Keep regular hours for sleep. Try to get at least 8 hours of sleep each night.  Do not use drugs or alcohol to ease your symptoms.  Take medicines only as directed by your health care provider.  Spend time with friends and loved ones.  Consider joining a grief (bereavement) support group to help you deal with your loss.  Keep all follow-up visits as directed by your health care provider. This is important. Contact a health care provider if:  Your symptoms keep you from functioning normally.  Your symptoms do not get better  with treatment. Get help right away if:  You have serious thoughts of hurting yourself or someone else.  You have suicidal feelings. This information is not intended to replace advice given to you by your health care provider. Make sure you discuss any questions you have with your health care provider. Document Released: 05/20/2005 Document Revised: 10/26/2015 Document Reviewed: 10/28/2013  2017 Elsevier

## 2016-06-28 NOTE — Assessment & Plan Note (Signed)
Mood appears more stable today than prior visits. Patient currently grieving over recent loss of mother. PHQ-9 is 20 compared to 24 last visit. She is compliant with medications. Will continue Klonopin for another 2 weeks. Follow-up in 3 weeks. Will fill out FMLA paperwork. Continue therapy discussions with Dr. Gwenlyn Saran.

## 2016-06-28 NOTE — Assessment & Plan Note (Addendum)
Report of mood is variable.  She is grieving in the context of a major depressive episode.  She appears to be doing a nice job of taking care of herself (rather than putting on a brave face for others) right now.  She rests when she needs to, is planning on an evening out with her husband, and allows the emotions to come out even in the presence of family members.  Discussed ways to refrain from catastrophizing when feeling emotionally overwhelmed.  Provided some brief psychoeducation around thoughts, feelings, and behaviors.    She reports taking the clonazepam as scheduled.  Reminded her that it is not a medicine to be taken in response to a feeling but according to a clock.  She agreed.    Of note, she had great insight that she feels like a burden has been lifted (upon the passing of her mother) and she feels guilty about thinking this way.  Normalized this however will continue to monitor.

## 2016-06-28 NOTE — Assessment & Plan Note (Signed)
Currently with pain manifestations on the right. Overall exam today negative. No red flags. Concern for somatic manifestations of her grief/depression in recent setting of loss of mother. Cymbalta prior did well at relieving symptoms but medication was discontinued due to being ineffective for her depression. Will just monitor at this time and reassess at next visit. Patient agreeable as well as she thinks it could be related to her current life stressors.

## 2016-07-05 ENCOUNTER — Ambulatory Visit (INDEPENDENT_AMBULATORY_CARE_PROVIDER_SITE_OTHER): Payer: BLUE CROSS/BLUE SHIELD | Admitting: Psychology

## 2016-07-05 DIAGNOSIS — F322 Major depressive disorder, single episode, severe without psychotic features: Secondary | ICD-10-CM

## 2016-07-05 NOTE — Progress Notes (Signed)
Reason for follow-up:  Patient is grieving the loss of her mother.  The funeral was on Monday.    Issues discussed:  Reports having good and bad days.  Wednesday was tough - did not get out of bed much, shower or get dressed.  Thursday was good and she put on make-up today.  Did better at the funeral than she had anticipated.  Is appreciative of the love and support she has received.

## 2016-07-05 NOTE — Assessment & Plan Note (Signed)
PHQ-9 of 19 is a bit surprising.  She notes several things she is doing and enjoyed like taking her son shopping, getting a massage.  Has plans to go on two vacations this spring.  She voluntarily identifies things of beauty in this painful grieving process.  She does not seem like she is thinking like a depressed person (negative view of self, the world, and the future).  This seems like grief.    She wonders whether the Clonazepam is what is allowing her to remain calm.  She has been taking it twice a day scheduled.  She has six days of medicine left.  Return to work date is 2/19.  Will discuss with Dr. Gerarda Fraction best course here.  Between now and 2/19, discussed being intentional about rest and activity - both are important.    Return to see me next Friday at 9:00.

## 2016-07-09 ENCOUNTER — Telehealth: Payer: Self-pay | Admitting: Psychology

## 2016-07-09 NOTE — Telephone Encounter (Signed)
Called patient to discuss continued use of clonazepam.  I had discussed with Dr. Andria Frames to get his recommendation around use of this medication in the context of grief and a major depressive episode.  He recommended the following:   Continue clonazepam twice a day through first week of work.  Continue night time dose for 2 weeks after   Plan to have medication dc about a month after the funeral.  Discussed with Dr. Gerarda Fraction who agreed with the plan.  Called patient and she was in agreement as well.  I plan to see her Friday.

## 2016-07-10 ENCOUNTER — Other Ambulatory Visit: Payer: Self-pay | Admitting: Obstetrics and Gynecology

## 2016-07-10 ENCOUNTER — Ambulatory Visit: Payer: BLUE CROSS/BLUE SHIELD | Admitting: Obstetrics and Gynecology

## 2016-07-10 MED ORDER — CLONAZEPAM 0.5 MG PO TABS: 0.5000 mg | ORAL_TABLET | Freq: Two times a day (BID) | ORAL | 0 refills | Status: DC

## 2016-07-12 ENCOUNTER — Ambulatory Visit (INDEPENDENT_AMBULATORY_CARE_PROVIDER_SITE_OTHER): Payer: BLUE CROSS/BLUE SHIELD | Admitting: Psychology

## 2016-07-12 DIAGNOSIS — F322 Major depressive disorder, single episode, severe without psychotic features: Secondary | ICD-10-CM

## 2016-07-12 NOTE — Assessment & Plan Note (Signed)
Report of mood is sad today.  She smiles appropriately.  Function appears to be reasonable despite report.  I think she is focused on not wanting to put on makeup and get dressed up to head out.  Perhaps she doesn't have the energy to "fake good" in this regard.  Continues to push herself to do other things.    Discussed not making any major life decisions (i.e. Moving) while grieving as she is.  This does not seem like that major of a decision given the logistics.  It is a rental.  They have been there less than a year.  She would not sacrifice much financially to move.  Her family is all on board so there may be other reasons to move at this point.  Did discuss the tendency to want something to change when hurting so much and people often regret decisions made in this way.  She voiced an understanding.    Hoped to support her around her son's evaluation.   I won't be here next Friday.  She elected to follow sooner because her return to work is the 19th.

## 2016-07-12 NOTE — Patient Instructions (Signed)
Please schedule a follow-up for 2/14 at 8:30.  Jenna Mcclure is the woman that talks on vulnerability.  She has an interesting Clare Gandy Talk that you might like.  You may have already watched it.  I think the grief counseling for your kids is so important.  Great job.  Let me know if I can help with [name]'s assessment.  Would be happy to get involved if you think it would be helpful.

## 2016-07-12 NOTE — Progress Notes (Signed)
Reason for follow-up:  Continued work on grief in the context of a major depressive disorder.  Issues discussed:    1.  It has been a tough morning.  She had difficulty motivating to get to the appointment.  She did mention that she stopped by her child's school and is going to meet with housing people after our appointment.  Mornings are hardest.  She wants to call her mom and remembers she can't.  Is considering moving out of this house. It reminds her and her children of when her mom was there.  They have no other ties (financial or emotional) to the house and she thinks it would feel good to get a fresh start.  2.  [name] is being evaluated for ADHD.  This is an additional stressor for Jenna Mcclure.  Discussed at length.    3.  Revisit of strong and weak and the tendency vulnerability has in connecting people.

## 2016-07-17 ENCOUNTER — Ambulatory Visit: Payer: BLUE CROSS/BLUE SHIELD

## 2016-07-17 ENCOUNTER — Telehealth: Payer: Self-pay | Admitting: Psychology

## 2016-07-17 NOTE — Telephone Encounter (Signed)
She missed her integrated care follow-up.  Called to check in.  Left a VM.

## 2016-07-30 DIAGNOSIS — Z13220 Encounter for screening for lipoid disorders: Secondary | ICD-10-CM | POA: Diagnosis not present

## 2016-07-30 DIAGNOSIS — Z713 Dietary counseling and surveillance: Secondary | ICD-10-CM | POA: Diagnosis not present

## 2016-07-30 DIAGNOSIS — Z6829 Body mass index (BMI) 29.0-29.9, adult: Secondary | ICD-10-CM | POA: Diagnosis not present

## 2016-07-30 DIAGNOSIS — Z136 Encounter for screening for cardiovascular disorders: Secondary | ICD-10-CM | POA: Diagnosis not present

## 2016-09-06 IMAGING — MR MR MRA HEAD W/O CM
12 of 13 series · 38 of 48 positions shown · IV contrast (multihance)
Comparison: None.

CLINICAL DATA: Intermittent RIGHT-sided facial pain. Remote history
of head injury.

EXAM:
MRI HEAD WITHOUT AND WITH CONTRAST
MRA HEAD WITHOUT CONTRAST
TECHNIQUE: Multiplanar, multiecho pulse sequences of the brain and surrounding
structures were obtained without and with intravenous contrast.
Angiographic images of the head were obtained using MRA technique
without contrast.
CONTRAST:  15mL MULTIHANCE GADOBENATE DIMEGLUMINE 529 MG/ML IV SOLN

[Series 5: T1 · sagittal · 4.0mm · 0.75mm/px · 1 of 31 slices shown (1 of 3)]
[im 1/31]
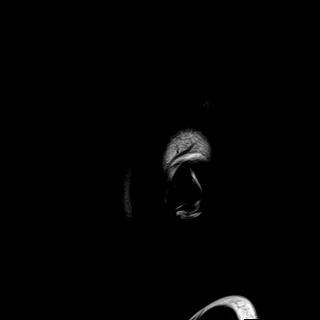

[Series 6: DWI · axial · 3.0mm · 1.44mm/px · z∈[-91,+44]mm · 4 of 84 slices shown (1 of 4)]
[im 1/84]
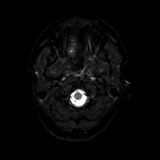
[im 28/84]
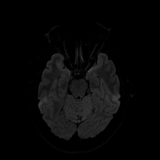
[im 56/84]
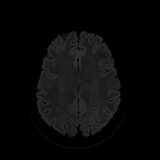
[im 84/84]
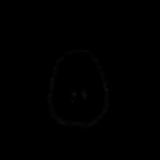

[Series 7: DWI · axial · 3.0mm · 1.44mm/px · z∈[-91,+44]mm · 2 of 42 slices shown (2 of 4)]
[im 1/42]
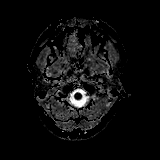
[im 42/42]
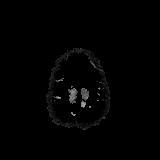

[Series 11: tof_[id number]_multi-slab · axial · 0.6mm · 0.26mm/px · z∈[-93,-55]mm · 4 of 175 slices shown]
[im 1/175]
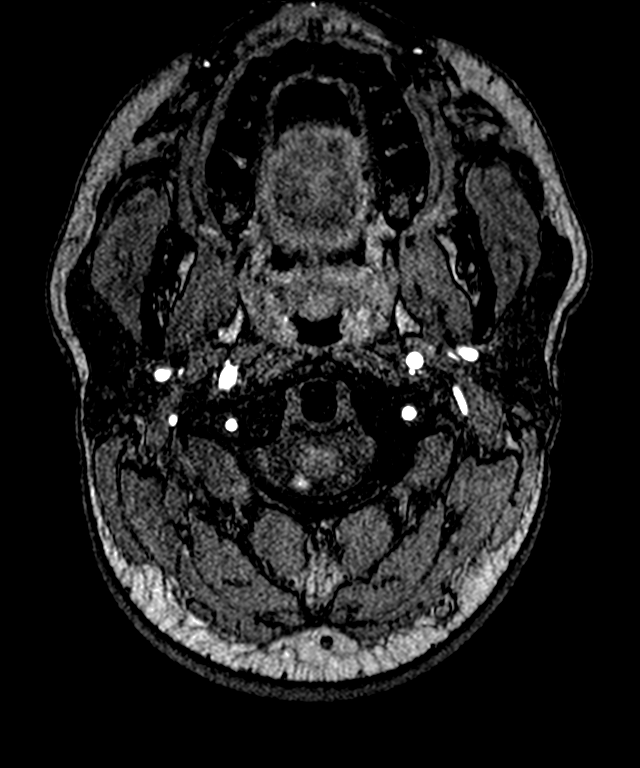
[im 22/175]
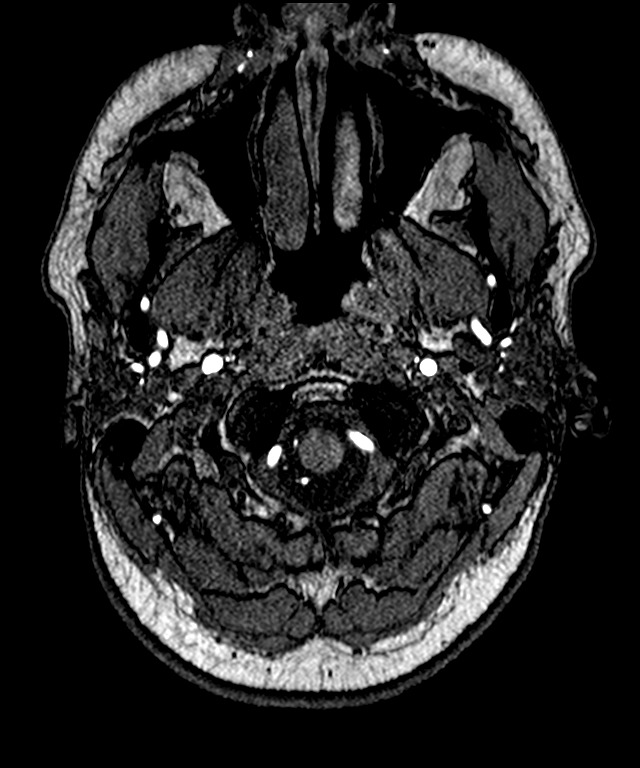
[im 44/175]
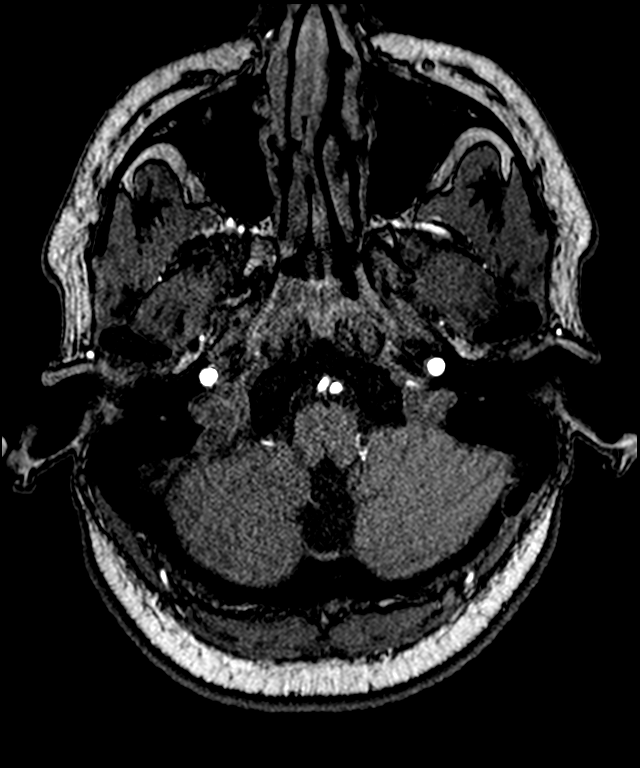
[im 66/175]
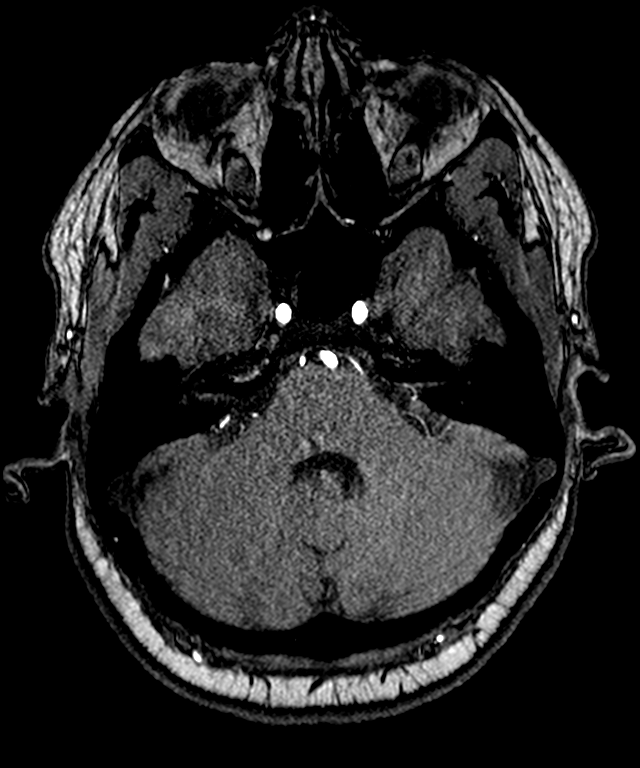

[Series 16: DWI · coronal · 5.0mm · 1.44mm/px · 3 of 64 slices shown (3 of 4)]
[im 1/64]
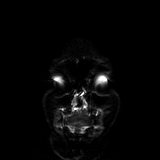
[im 32/64]
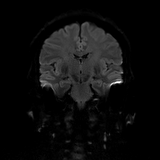
[im 64/64]
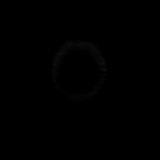

[Series 17: DWI · coronal · 5.0mm · 1.44mm/px · 2 of 32 slices shown (4 of 4)]
[im 1/32]
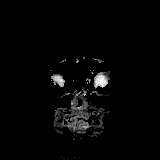
[im 32/32]
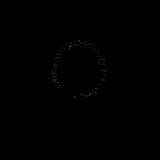

[Series 18: T2 · axial · 4.0mm · 0.36mm/px · z∈[-101,+50]mm · 2 of 30 slices shown]
[im 1/30]
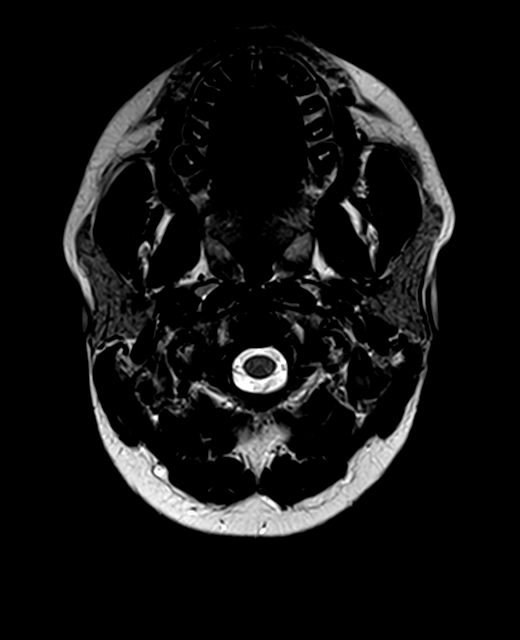
[im 30/30]
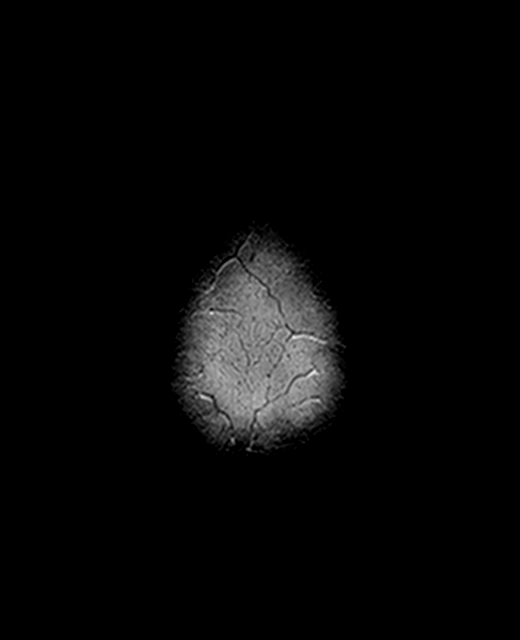

[Series 19: FLAIR · axial · 4.0mm · 0.72mm/px · z∈[-101,+49]mm · 2 of 30 slices shown]
[im 1/30]
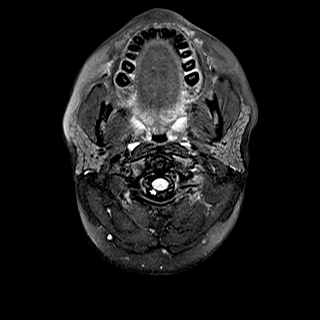
[im 30/30]
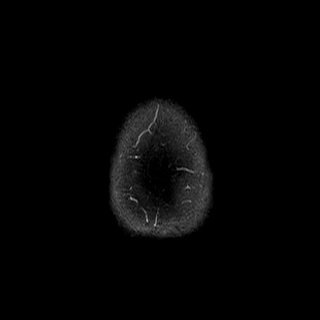

[Series 22: T1 · axial · 1.0mm · 0.90mm/px · z∈[-96,+47]mm · 7 of 144 slices shown (2 of 3)]
[im 1/144]
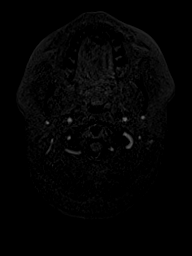
[im 24/144]
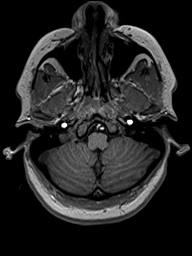
[im 48/144]
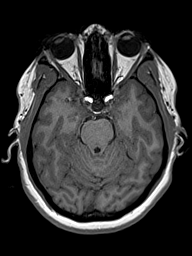
[im 72/144]
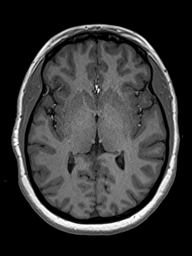
[im 96/144]
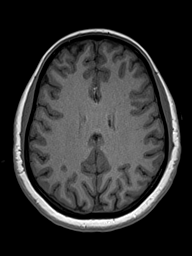
[im 120/144]
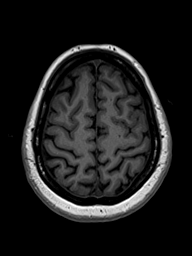
[im 144/144]
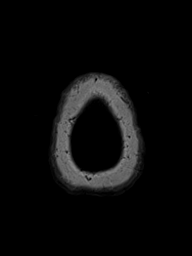

[Series 23: T2 post-contrast · coronal · 4.5mm · 0.36mm/px · 2 of 30 slices shown]
[im 1/30]
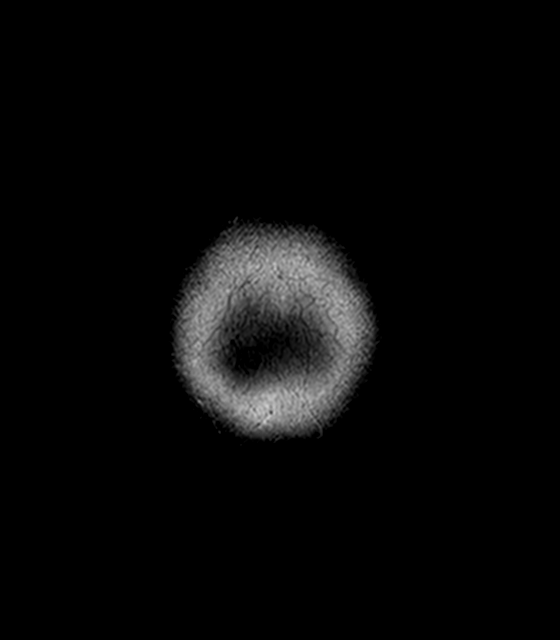
[im 30/30]
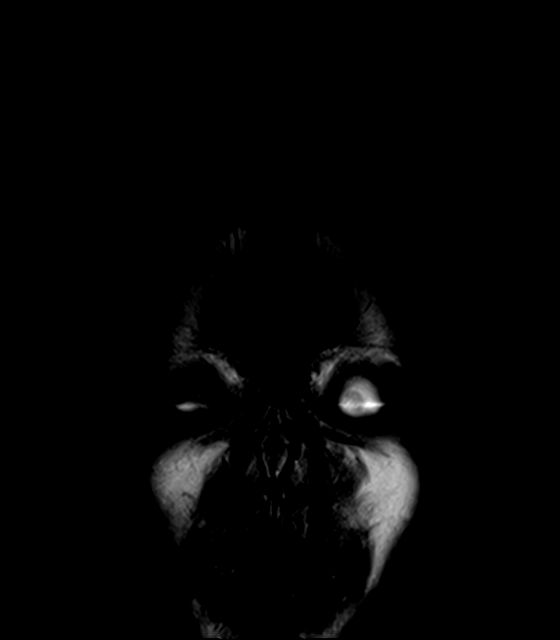

[Series 24: T1 · axial · 1.0mm · 0.90mm/px · z∈[-96,+47]mm · 7 of 144 slices shown (3 of 3)]
[im 1/144]
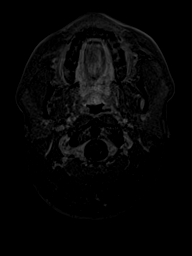
[im 24/144]
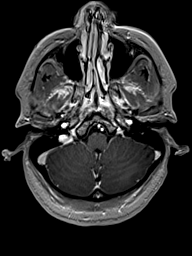
[im 48/144]
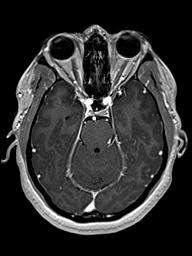
[im 72/144]
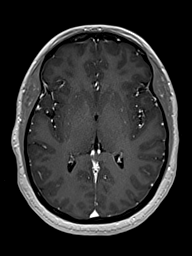
[im 96/144]
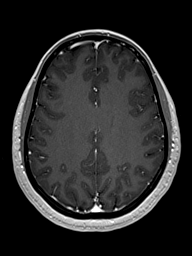
[im 120/144]
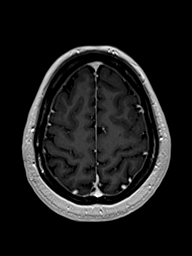
[im 144/144]
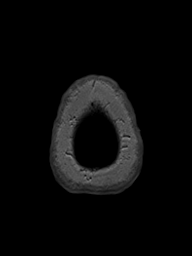

[Series 25: T1 post-contrast · coronal · 4.5mm · 0.72mm/px · 2 of 30 slices shown]
[im 1/30]
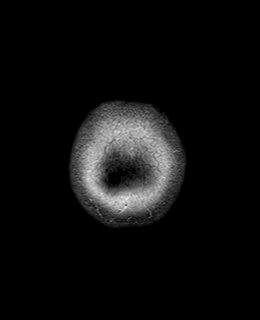
[im 30/30]
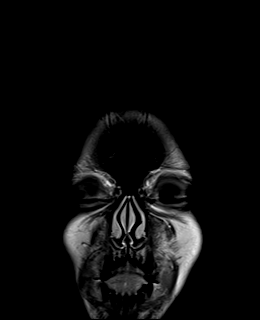

[38 of 48 positions shown; findings below may reference images not displayed]

FINDINGS: MRI HEAD FINDINGS

No evidence for acute infarction, hemorrhage, mass lesion,
hydrocephalus, or extra-axial fluid. Normal cerebral volume.

At least two subcentimeter foci of white matter signal abnormality
LEFT periatrial region (image 16 series 19), and LEFT occipital
subcortical white matter (same slice), uncertain significance.
Chronic infection, complicated migraine, vasculitis, demyelinating
disease, or idiopathic are all considerations. No restricted
diffusion or enhancement of lesions.

Partial empty sella. Low signal intensity bone marrow in the upper
cervical region favored to represent sequelae of mild anemia.

Post infusion, no abnormal enhancement of brain or meninges.

Visualized calvarium, skull base, and upper cervical osseous
structures unremarkable. Scalp and extracranial soft tissues,
orbits, sinuses, and mastoids show no acute process.

MRA HEAD FINDINGS

The internal carotid arteries are widely patent. The basilar artery
is widely patent. Vertebrals codominant. No intracranial stenosis or
aneurysm.
IMPRESSION: No acute intracranial findings. No abnormal postcontrast
enhancement. No cause is seen for the reported facial numbness.

Two small foci of white matter signal abnormality in the LEFT
hemisphere, uncertain significance. See discussion above.

Negative MRA intracranial circulation.

## 2016-09-11 DIAGNOSIS — N390 Urinary tract infection, site not specified: Secondary | ICD-10-CM | POA: Diagnosis not present

## 2016-09-11 DIAGNOSIS — N760 Acute vaginitis: Secondary | ICD-10-CM | POA: Diagnosis not present

## 2016-09-16 ENCOUNTER — Other Ambulatory Visit: Payer: Self-pay | Admitting: *Deleted

## 2016-09-16 MED ORDER — PANTOPRAZOLE SODIUM 20 MG PO TBEC: 20.0000 mg | DELAYED_RELEASE_TABLET | Freq: Every day | ORAL | 3 refills | Status: DC

## 2016-09-24 ENCOUNTER — Telehealth: Payer: Self-pay | Admitting: Psychology

## 2016-09-24 NOTE — Telephone Encounter (Signed)
Patient left a VM earlier today canceling appointment for Friday.  I called her back and she was following an ambulance to [facility] with her son in it.  Apparently, her 36 year old son reported difficulty breathing at school.  An ambulance was called.  [facility] made it to the school and said Jenna Mc[facility] was able to talk but that they thought it best to transport.  4/22 was the three month anniversary of her mother's death.  She has had other significant stressors recently.  She feels like she can't get any time off of work to attend to her grief.  She has had difficulty coming in to see me.  I asked her to focus on the most important thing right now - which is her son.  I asked her to call me tomorrow.  Intermittent FMLA may be important.  Additionally - finding a therapy connection outside of normal business hours might be good.  Finally - grief counseling through [facility]osp[facility] may be especially important if it works within her schedule.    Will await follow up tomorrow or will call her.

## 2016-09-26 NOTE — Telephone Encounter (Signed)
[facility] followed up with me today.  She is feeling better.  Her son is okay and has follow-up scheduled.  Discussed next best steps. She is interested in intermittent FMLA as her grief comes and goes and at times, she finds herself unable to function adequately.  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  She plans to discuss with Dr. Gerarda Fraction.  She wonders whether she might need more depression medicine.  It looks like she is just on 50 mg.  That dose could be increased.  She is out of the acute stage of grief and I think a benzodiazepine at this point has the potential to do more harm than good.  She wasn't asking for it but she did wonder about her Zoloft.    I think the most important thing she needs is regular therapy - specifically around her grief.  We discussed Hospice as a starting place.  She committed to call some time today.  She may benefit from a traditional therapist.  [facility] was useful as a first step but I think she has moved beyond that now.  If counseling at Hospice is not available outside of normal business hours or not enough, we will discuss therapy recommendations.  I will call her next week to check-in.

## 2016-09-27 ENCOUNTER — Ambulatory Visit: Payer: BLUE CROSS/BLUE SHIELD | Admitting: Psychology

## 2016-10-09 ENCOUNTER — Ambulatory Visit (INDEPENDENT_AMBULATORY_CARE_PROVIDER_SITE_OTHER): Payer: BLUE CROSS/BLUE SHIELD | Admitting: Obstetrics and Gynecology

## 2016-10-09 ENCOUNTER — Encounter: Payer: Self-pay | Admitting: Obstetrics and Gynecology

## 2016-10-09 VITALS — BP 130/80 | HR 97 | Temp 98.1°F | Ht 62.0 in | Wt 169.4 lb

## 2016-10-09 DIAGNOSIS — F4320 Adjustment disorder, unspecified: Secondary | ICD-10-CM | POA: Diagnosis not present

## 2016-10-09 DIAGNOSIS — F322 Major depressive disorder, single episode, severe without psychotic features: Secondary | ICD-10-CM | POA: Diagnosis not present

## 2016-10-09 DIAGNOSIS — Z659 Problem related to unspecified psychosocial circumstances: Secondary | ICD-10-CM

## 2016-10-09 DIAGNOSIS — F4321 Adjustment disorder with depressed mood: Secondary | ICD-10-CM

## 2016-10-09 MED ORDER — CLONAZEPAM 0.5 MG PO TABS: 0.5000 mg | ORAL_TABLET | Freq: Two times a day (BID) | ORAL | 0 refills | Status: DC | PRN

## 2016-10-09 MED ORDER — SERTRALINE HCL 50 MG PO TABS: 75.0000 mg | ORAL_TABLET | Freq: Every day | ORAL | 2 refills | Status: DC

## 2016-10-09 NOTE — Progress Notes (Signed)
     Subjective: Chief Complaint  Patient presents with  . FMLA paperwork     HPI: Jenna Mcclure is a 36 y.o. presenting to clinic today to discuss the following:  #Depression anxiety Wants intermittent FMLA to get counseling and for flare-ups of depression/anxiety Unable to take off work during normal hours to go to [employer]	[place] 8-5 Had breakdown at work and it compromised her job Taking medication feels the same Has extra life stress and triggering more episodes of anxiety and anger Was following with Dr. Gwenlyn Saran for therapy in clinic but will likely need for specialized care Taking her Zoloft  Denies si/hi   #Grief  Continues to battle with loss of her mother Has about 3-4 break downs a month States "struggling through" Has reached out for grief counseling and has appointment coming up      ROS noted in HPI.   Past Medical, Surgical, Social, and Family History Reviewed & Updated per EMR.   Pertinent Historical Findings include:   History  Smoking Status  . Never Smoker  Smokeless Tobacco  . Never Used     Objective: BP 130/80 (BP Location: Right Arm, Patient Position: Sitting, Cuff Size: Normal)   Pulse 97   Temp 98.1 F (36.7 C) (Oral)   Ht 5\' 2"  (1.575 m)   Wt 169 lb 6.4 oz (76.8 kg)   LMP  (LMP Unknown)   SpO2 99%   BMI 30.98 kg/m  Vitals and nursing notes reviewed  Physical Exam  Constitutional: She is well-developed, well-nourished, and in no distress.  HENT:  Head: Normocephalic and atraumatic.  Cardiovascular: Normal rate.   Pulmonary/Chest: Effort normal.  Psychiatric: Affect normal. She exhibits a depressed mood. She expresses no homicidal and no suicidal ideation.  Tearful     Depression screen [facility] 2/9 10/09/2016 10/09/2016 07/12/2016  Decreased Interest 2 0 3  Down, Depressed, Hopeless 2 0 3  PHQ - 2 Score 4 0 6  Altered sleeping 2 - 3  Tired, decreased energy 2 - 3  Change in appetite 2 - 2  Feeling bad or failure about yourself  3 -  2  Trouble concentrating 2 - 1  Moving slowly or fidgety/restless 3 - 1  Suicidal thoughts 1 - 0  PHQ-9 Score 19 - 18  Difficult doing work/chores - - (No Data)  Some recent data might be hidden     Assessment/Plan: Please see problem based Assessment and Plan PATIENT EDUCATION PROVIDED: See AVS     Luiz Blare, DO 10/09/2016, 8:48 AM PGY-3, [place]

## 2016-10-09 NOTE — Patient Instructions (Addendum)
Will complete your FMLA paperwork and fax it Please keep all appointments with counselors - update me with any changes Zoloft dose increased to 75mg  Given as needed anxiety relief medication  Follow-up in 2 months for depression/anxiety if not already established with another practice

## 2016-10-11 ENCOUNTER — Telehealth: Payer: Self-pay | Admitting: Family Medicine

## 2016-10-11 NOTE — Telephone Encounter (Signed)
[facility] After Hours Telephone Line   Number received via page: [phone] Person calling: York Grice  Reason for call: Patient states that she was given Klonopin by her PCP Dr. Gerarda Fraction earlier this week. When she obtained the Klonopin from the pharmacists they told her to make sure that she cut her pills in half to take the correct dose, which would be 0.5 mg. She states that she accidentally took 1 mg of Klonopin about an hour ago. Denies feeling groggy, sleepy. She has her daughter staying with her in her house currently. I recommended that she does not take anymore Klonopin over the next 24 hours. She should also have her daughter check on her every couple of hours over next 4-5 hours to makes sure she does not become extremely groggy. However I reassured her that she would likely be okay.  Kerrin Mo PGY-2 [place]

## 2016-10-14 NOTE — Assessment & Plan Note (Signed)
May have some component of complex grief. Wills tart going to grief counseling soon. Will continue to support patient through this difficult time.

## 2016-10-14 NOTE — Assessment & Plan Note (Signed)
Continues to have a lot of social stressors in life that are exacerbating her depression/anxiety. Discussed stress management

## 2016-10-14 NOTE — Assessment & Plan Note (Signed)
Continues to have depressed mood. PHQ-9 19 (not well controlled). Will increase Zoloft to 75mg . Also given Rx for continued Klonopin to help with anxiety and panic. Patient getting counseling and therapy through work. Form filled out for intermittent FMLA.

## 2016-10-17 ENCOUNTER — Emergency Department (HOSPITAL_COMMUNITY)
Admission: EM | Admit: 2016-10-17 | Discharge: 2016-10-17 | Disposition: A | Discharge status: Home / Self Care | Payer: BLUE CROSS/BLUE SHIELD | Attending: Dermatology | Admitting: Dermatology

## 2016-10-17 ENCOUNTER — Encounter (HOSPITAL_COMMUNITY): Payer: Self-pay

## 2016-10-17 DIAGNOSIS — Z5321 Procedure and treatment not carried out due to patient leaving prior to being seen by health care provider: Secondary | ICD-10-CM | POA: Insufficient documentation

## 2016-10-17 DIAGNOSIS — R200 Anesthesia of skin: Secondary | ICD-10-CM | POA: Insufficient documentation

## 2016-10-17 NOTE — ED Triage Notes (Signed)
Pt states new onset of numbness and burning sensation in right arm and right leg on yesterday; Pt states she has hx of pinched nerve; pt states it feel the same except she feels like her leg is weighted down;  Pt states pain at 3/10 on arrival. Pt states she also noted some swelling; pt denies injury;

## 2016-10-18 ENCOUNTER — Emergency Department (HOSPITAL_COMMUNITY)
Admission: EM | Admit: 2016-10-18 | Discharge: 2016-10-18 | Disposition: A | Discharge status: Home / Self Care | Payer: BLUE CROSS/BLUE SHIELD | Attending: Emergency Medicine | Admitting: Emergency Medicine

## 2016-10-18 ENCOUNTER — Encounter (HOSPITAL_COMMUNITY): Payer: Self-pay | Admitting: Emergency Medicine

## 2016-10-18 DIAGNOSIS — I10 Essential (primary) hypertension: Secondary | ICD-10-CM | POA: Insufficient documentation

## 2016-10-18 DIAGNOSIS — R202 Paresthesia of skin: Secondary | ICD-10-CM | POA: Insufficient documentation

## 2016-10-18 DIAGNOSIS — Z79899 Other long term (current) drug therapy: Secondary | ICD-10-CM | POA: Insufficient documentation

## 2016-10-18 DIAGNOSIS — R200 Anesthesia of skin: Secondary | ICD-10-CM | POA: Diagnosis present

## 2016-10-18 LAB — COMPREHENSIVE METABOLIC PANEL
ALBUMIN: 3.7 g/dL (ref 3.5–5.0)
ALK PHOS: 55 U/L (ref 38–126)
ALT: 13 U/L — AB (ref 14–54)
AST: 20 U/L (ref 15–41)
Anion gap: 8 (ref 5–15)
BUN: 7 mg/dL (ref 6–20)
CALCIUM: 8.9 mg/dL (ref 8.9–10.3)
CHLORIDE: 109 mmol/L (ref 101–111)
CO2: 22 mmol/L (ref 22–32)
Creatinine, Ser: 0.78 mg/dL (ref 0.44–1.00)
GFR calc non Af Amer: >60 mL/min (ref >60)
Glucose, Bld: 101 mg/dL — ABNORMAL HIGH (ref 65–99)
Potassium: 3.9 mmol/L (ref 3.5–5.1)
SODIUM: 139 mmol/L (ref 135–145)
Total Bilirubin: 0.5 mg/dL (ref 0.3–1.2)
Total Protein: 6.8 g/dL (ref 6.5–8.1)

## 2016-10-18 LAB — CBC WITH DIFFERENTIAL/PLATELET
BASOS PCT: 0 %
Basophils Absolute: 0 10*3/uL (ref 0.0–0.1)
EOS ABS: 0.1 10*3/uL (ref 0.0–0.7)
EOS PCT: 1 %
HCT: 32.2 % — ABNORMAL LOW (ref 36.0–46.0)
HEMOGLOBIN: 10.4 g/dL — AB (ref 12.0–15.0)
Lymphocytes Relative: 29 %
Lymphs Abs: 1.9 10*3/uL (ref 0.7–4.0)
MCH: 25.4 pg — AB (ref 26.0–34.0)
MCHC: 32.3 g/dL (ref 30.0–36.0)
MCV: 78.7 fL (ref 78.0–100.0)
Monocytes Absolute: 0.3 10*3/uL (ref 0.1–1.0)
Monocytes Relative: 4 %
NEUTROS ABS: 4.2 10*3/uL (ref 1.7–7.7)
NEUTROS PCT: 66 %
PLATELETS: 316 10*3/uL (ref 150–400)
RBC: 4.09 MIL/uL (ref 3.87–5.11)
RDW: 17.3 % — ABNORMAL HIGH (ref 11.5–15.5)
WBC: 6.5 10*3/uL (ref 4.0–10.5)

## 2016-10-18 MED ORDER — DULOXETINE HCL 20 MG PO CPEP: 20.0000 mg | ORAL_CAPSULE | Freq: Every day | ORAL | 0 refills | Status: DC

## 2016-10-18 NOTE — ED Triage Notes (Signed)
Patient reports neck pain that radiates into R arm which began yesterday. Also, reports numbness and swelling in R arm and R leg. Reports that she has experienced this in the past, and had MRI. Treated for pinched nerve. Neuro intact. No deficits noted.

## 2016-10-18 NOTE — ED Provider Notes (Signed)
[facility] DEPT Provider Note   CSN: [id number] Arrival date & time: 10/18/16  0749     History   Chief Complaint Chief Complaint  Patient presents with  . Neck Pain  . Numbness    HPI Jenna Mcclure is a 36 y.o. female.  HPI Patient presents with concern of right-sided paresthesia. She notes history of similar events, going back at least 7 years, when she had her last delivery of a child. She notes that she has had prior control with Cymbalta. Over the past 2 days patient has developed paresthesia in the right upper, right lower extremity, now unusual in that it is similar distribution, but with associated subjective swelling. No new fever, chills, complete LOC of sensation anywhere, incontinence, abdominal pain. Patient also has a notable history of recent passing of her mother for which she is started when necessary anxiolysis. Patient was also prescribed antidepressant, but states that she is not taking this medication.    Past Medical History:  Diagnosis Date  . Allergic rhinitis   . Borderline hypertension   . Breastfeeding (infant)   . GERD (gastroesophageal reflux disease)   . Heart murmur    MILD -  ASYMPTOMATIC  . History of abnormal cervical Pap smear   . History of ovarian cyst   . Wears glasses     Patient Active Problem List   Diagnosis Date Noted  . Grief 10/09/2016  . Major depression 03/08/2016  . Vitamin D deficiency 11/10/2015  . 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 Sebaceous cyst of right axilla 09/16/2015  . Other social stressor 11/26/2012  . GERD (gastroesophageal reflux disease) 03/19/2011  . Chronic pain syndrome 02/12/2011    Past Surgical History:  Procedure Laterality Date  . DIAGNOSTIC LAPAROSCOPY  2009  . LAPAROSCOPIC TUBAL LIGATION Bilateral 02/15/2014   Procedure: LAPAROSCOPIC TUBAL LIGATION WITH FILSHIE CLIPS;  Surgeon: Margarette Asal, MD;  Location: [facility];  Service: Gynecology;  Laterality: Bilateral;    . TUBAL LIGATION      OB History    Gravida Para Term Preterm AB Living   6 6 6  0 0 6   SAB TAB Ectopic Multiple Live Births   0 0 0 0 6       Home Medications    Prior to Admission medications   Medication Sig Start Date End Date Taking? Authorizing Provider  cetirizine (ZYRTEC) 10 MG tablet Take 1 tablet (10 mg total) by mouth daily. 07/12/15   Ivar Drape D, PA  Cholecalciferol (VITAMIN D) 2000 UNITS CAPS Take 2,000 Units by mouth daily.    [provider]  clonazePAM (KLONOPIN) 0.5 MG tablet Take 1 tablet (0.5 mg total) by mouth 2 (two) times daily as needed for anxiety. 10/09/16   Katheren Shams, DO  ibuprofen (ADVIL,MOTRIN) 200 MG tablet Take 600 mg by mouth every 6 (six) hours as needed for mild pain or moderate pain. Reported on 07/12/2015    [provider]  ondansetron (ZOFRAN-ODT) 4 MG disintegrating tablet Take 1 tablet (4 mg total) by mouth every 8 (eight) hours as needed for nausea or vomiting. 06/14/16   Katheren Shams, DO  pantoprazole (PROTONIX) 20 MG tablet Take 1 tablet (20 mg total) by mouth daily. 09/16/16   Katheren Shams, DO  sertraline (ZOLOFT) 50 MG tablet Take 1.5 tablets (75 mg total) by mouth daily. 10/09/16   Katheren Shams, DO  triamcinolone (NASACORT) 55 MCG/ACT AERO nasal inhaler Place 2 sprays into the nose  daily. 07/12/15   Ivar Drape D, PA  Vitamin D, Ergocalciferol, (DRISDOL) 50000 units CAPS capsule Take 1 capsule (50,000 Units total) by mouth every 7 (seven) days. 03/08/16   Katheren Shams, DO    Family History Family History  Problem Relation Age of Onset  . Hypertension Mother   . Cancer Mother        bladder  . Cancer Father        leaukemia  . Hypertension Maternal Aunt   . Diabetes Maternal Uncle   . Diabetes Maternal Grandmother   . Diabetes Paternal Grandmother     Social History Social History  Substance Use Topics  . Smoking status: Never Smoker  . Smokeless tobacco: Never Used  . Alcohol use Yes      Comment: RARE     Allergies   Miconazole nitrate   Review of Systems Review of Systems  Constitutional:       Per HPI, otherwise negative  HENT:       Per HPI, otherwise negative  Respiratory:       Per HPI, otherwise negative  Cardiovascular:       Per HPI, otherwise negative  Gastrointestinal: Negative for vomiting.  Endocrine:       Negative aside from HPI  Genitourinary:       Neg aside from HPI   Musculoskeletal:       Per HPI, otherwise negative  Skin: Negative.   Neurological: Positive for numbness. Negative for syncope.     Physical Exam Updated Vital Signs BP 123/88   Pulse 80   Temp 98.1 F (36.7 C) (Oral)   Resp 18   Ht 5\' 2"  (1.575 m)   Wt 169 lb (76.7 kg)   LMP 10/16/2016   SpO2 100%   BMI 30.91 kg/m   Physical Exam  Constitutional: She is oriented to person, place, and time. She appears well-developed and well-nourished. No distress.  HENT:  Head: Normocephalic and atraumatic.  Eyes: Conjunctivae and EOM are normal.  Cardiovascular: Normal rate and regular rhythm.   Pulmonary/Chest: Effort normal and breath sounds normal. No stridor. No respiratory distress.  Abdominal: She exhibits no distension.  Musculoskeletal: She exhibits no edema.  Neurological: She is alert and oriented to person, place, and time. She displays no atrophy and no tremor. No cranial nerve deficit. She exhibits normal muscle tone. She displays no seizure activity. Coordination normal.  Skin: Skin is warm and dry.  Psychiatric: She has a normal mood and affect.  Nursing note and vitals reviewed.    ED Treatments / Results  LabsReviewed, unremarkable  Procedures Procedures (including critical care time)  10:14 AM Patient awake and alert, in no distress. Initial Impression / Assessment and Plan / ED Course  I have reviewed the triage vital signs and the nursing notes.  Pertinent labs & imaging results that were available during my care of the patient were  reviewed by me and considered in my medical decision making (see chart for details).  Young female presents with ongoing paresthesia in the right upper and lower extremity. Here she is awake and alert, with no objective neurologic deficiencies. Patient has a history of similar events going back approximately 7 years. With reassuring labs, no evidence for new deficiency, the patient was started on Cymbalta, which has worked for her previously. She also provided referral to a local physical rehabilitation Center for consideration of radicular etiology. Absent red flags, abnormal vitals, abnormal physical exam findings, patient appropriate for outpatient follow-up.  Final Clinical Impressions(s) / ED Diagnoses  Paresthesia   Carmin Muskrat, MD 10/18/16 1014

## 2016-10-18 NOTE — Discharge Instructions (Signed)
As discussed, your evaluation today has been largely reassuring.  But, it is important that you monitor your condition carefully, and do not hesitate to return to the ED if you develop new, or concerning changes in your condition. ? ?Otherwise, please follow-up with your physician for appropriate ongoing care. ? ?

## 2016-10-19 ENCOUNTER — Telehealth: Payer: Self-pay | Admitting: Family Medicine

## 2016-10-19 NOTE — Telephone Encounter (Signed)
After Hours Telephone Call  Patient notes that she was started on Cymbalta this morning for a pinched nerve.  She states that she was given Klonopin by PCP for mother's death.  Cousin just died tonight and wants to make sure it is same to take a Klonopin with the Cymbalta.  She is tearful on the phone. Denies SI.  Reassured her that it is ok to take these together.  Should f/u with PCP.  Virginia Crews, MD, MPH PGY-3,  [facility] 10/19/2016 12:05 AM

## 2016-12-05 NOTE — Progress Notes (Signed)
   Subjective:   THETA LEAF is a 36 y.o. female with a history of chronic pain syndrome, sebaceous cyst of right axilla, vitamin D deficiency here for swollen right arm and leg.  She states her intermittent pain, numbness, tingling and constant swelling began around 6 years ago after the birth of her son and has worsened in the last few months.  She has tried Cymbalta in the past and it worked for about 3 months until she recently stopped taking it 3 days ago due to unwanted side effects of nausea, loss of appetite and diarrhea.  She states the swelling starts at the right side of her neck and extends down her right arm then to her right leg.  She also endorses trouble swallowing at times and is unsure if this finding is related.  She states her son has thyroid problems and wants to know if her thyroid may have issues causing the swelling.  She states ice packs help some but tires of having to use them repeatedly.  Ibuprofen does not help.  She has an appointment later today with [facility] and is hopeful to find some relief.    She had a mammogram and ultrasound in 2015 to assess her right axilla swelling and saw a surgeon who stated it was not big enough to operate.  She denies fever, incontinence, changes in skin temperature/texture/sensation, or gait abnormalities.  Denies recent injuries or trauma.  Review of Systems:  Per HPI.   Social History: Never smoker  Objective:  BP 118/72   Pulse (!) 101   Temp 99.5 F (37.5 C) (Oral)   Wt 167 lb (75.8 kg)   LMP 11/16/2016   SpO2 99%   BMI 30.54 kg/m   Gen:  36 y.o. female in NAD  HEENT: NCAT, MMM, anicteric sclerae CV: RRR, no MRG Resp: Non-labored, CTAB, no wheezes noted Ext: WWP, no edema MSK: Full ROM, strength intact and symmetric. Neuro: Alert and oriented, speech normal.      Assessment & Plan:     GIAH FICKETT is a 36 y.o. female here for  Nerve pain - TSH, vitamin D and B12 levels to assess for  deficiencies.  - Gabapentin daily for pain. - If Gabapentin does not improve pain, may consider neck xray/MRI to rule out radiculopathy. - Referral to PT  Neck swelling - TSH, vitamin D and B12 to assess for deficiencies - If swelling does not improve, may consider neck xray/MRI. - Referral to PT.  Grief Recent loss of mother and cousin may be contributing to somatic symptoms.  Patient has seen grief counseling with hospice and is considering following back up with them once she has had time to "slow down" and process.  Patient was in good spirits during today's visit.    Rory Percy, DO  PGY-1,  [facility] 12/06/2016  5:38 PM

## 2016-12-06 ENCOUNTER — Encounter: Payer: Self-pay | Admitting: Family Medicine

## 2016-12-06 ENCOUNTER — Ambulatory Visit (INDEPENDENT_AMBULATORY_CARE_PROVIDER_SITE_OTHER): Payer: BLUE CROSS/BLUE SHIELD | Admitting: Family Medicine

## 2016-12-06 VITALS — BP 118/72 | HR 101 | Temp 99.5°F | Wt 167.0 lb

## 2016-12-06 DIAGNOSIS — F4320 Adjustment disorder, unspecified: Secondary | ICD-10-CM | POA: Diagnosis not present

## 2016-12-06 DIAGNOSIS — R221 Localized swelling, mass and lump, neck: Secondary | ICD-10-CM

## 2016-12-06 DIAGNOSIS — M792 Neuralgia and neuritis, unspecified: Secondary | ICD-10-CM | POA: Diagnosis not present

## 2016-12-06 DIAGNOSIS — F4321 Adjustment disorder with depressed mood: Secondary | ICD-10-CM

## 2016-12-06 MED ORDER — GABAPENTIN 300 MG PO CAPS: 300.0000 mg | ORAL_CAPSULE | Freq: Every day | ORAL | 0 refills | Status: DC

## 2016-12-06 NOTE — Assessment & Plan Note (Signed)
Recent loss of mother and cousin may be contributing to somatic symptoms.  Patient has seen grief counseling with hospice and is considering following back up with them once she has had time to "slow down" and process.  Patient was in good spirits during today's visit.

## 2016-12-06 NOTE — Assessment & Plan Note (Signed)
-   TSH, vitamin D and B12 to assess for deficiencies - If swelling does not improve, may consider neck xray/MRI. - Referral to PT.

## 2016-12-06 NOTE — Assessment & Plan Note (Signed)
-   TSH, vitamin D and B12 levels to assess for deficiencies.  - Gabapentin daily for pain. - If Gabapentin does not improve pain, may consider neck xray/MRI to rule out radiculopathy. - Referral to PT

## 2016-12-06 NOTE — Patient Instructions (Signed)
It was great to see you again!  For your leg, arm, and neck pain and swelling,   - Labwork: Thyroid panel, Vitamin D and B12 levels - Physical therapy referral - someone will call you with appointment details - Start Gabapentin 300mg  daily.  We are checking some labs today, and someone will call you or send you a letter with the results when they are available.   Take care and seek immediate care sooner if you develop any concerns. It was great to see you today!  Rory Percy, DO [facility]

## 2016-12-07 LAB — VITAMIN B12: Vitamin B-12: 627 pg/mL (ref 232–1245)

## 2016-12-07 LAB — TSH: TSH: 1.51 u[IU]/mL (ref 0.450–4.500)

## 2016-12-07 LAB — VITAMIN D 25 HYDROXY (VIT D DEFICIENCY, FRACTURES): Vit D, 25-Hydroxy: 23.2 ng/mL — ABNORMAL LOW (ref 30.0–100.0)

## 2016-12-09 ENCOUNTER — Other Ambulatory Visit: Payer: Self-pay | Admitting: Family Medicine

## 2016-12-09 ENCOUNTER — Telehealth: Payer: Self-pay

## 2016-12-09 MED ORDER — VITAMIN D (ERGOCALCIFEROL) 1.25 MG (50000 UNIT) PO CAPS: 50000.0000 [IU] | ORAL_CAPSULE | ORAL | 0 refills | Status: DC

## 2016-12-09 NOTE — Telephone Encounter (Signed)
-----   Message from Rory Percy, DO sent at 12/09/2016  8:57 AM EDT ----- Marykay Lex guys,  Could you call the patient and let her know her TSH and Vit B12 levels were normal and her Vitamin D level was slightly low so I sent in a refill for Vitamin D 50,000 IU.  Thanks!! Bryson Ha

## 2016-12-09 NOTE — Telephone Encounter (Signed)
Pt informed of results and plan. Pt voiced understanding.

## 2017-01-03 ENCOUNTER — Other Ambulatory Visit: Payer: Self-pay | Admitting: Family Medicine

## 2017-01-03 DIAGNOSIS — M792 Neuralgia and neuritis, unspecified: Secondary | ICD-10-CM

## 2017-01-09 ENCOUNTER — Ambulatory Visit: Payer: BLUE CROSS/BLUE SHIELD | Admitting: Rehabilitation

## 2017-01-10 ENCOUNTER — Ambulatory Visit: Payer: BLUE CROSS/BLUE SHIELD | Admitting: Physical Therapy

## 2017-01-10 ENCOUNTER — Ambulatory Visit: Payer: BLUE CROSS/BLUE SHIELD | Attending: Family Medicine | Admitting: Physical Therapy

## 2017-01-10 ENCOUNTER — Encounter: Payer: Self-pay | Admitting: Physical Therapy

## 2017-01-10 DIAGNOSIS — R29818 Other symptoms and signs involving the nervous system: Secondary | ICD-10-CM | POA: Insufficient documentation

## 2017-01-11 ENCOUNTER — Encounter: Payer: Self-pay | Admitting: Family Medicine

## 2017-01-11 NOTE — Therapy (Signed)
[facility] [address] [place], [place], [address] Phone: [phone]   Fax:  [phone]  Physical Therapy Evaluation and Discharge  Patient Details  Name: Jenna Mcclure MRN: [id number] Date of Birth: 1980/12/09 Referring Provider: Rory Percy DO  Encounter Date: 01/10/2017      PT End of Session - 01/11/17 0738    Visit Number 1   Number of Visits 1   Authorization Type BCBS   PT Start Time [phone]   PT Stop Time 0935   PT Time Calculation (min) 43 min   Activity Tolerance Patient tolerated treatment well   Behavior During Therapy [facility] for tasks assessed/performed         Plan - 01/11/17 0740    Clinical Impression Statement 36 yo referred for PT evaluation by PCP for nerve pain. Patient with history of atypical Rt hemi-body pain and subjective edema. (See subjective for full description of events of past 7 years). Circumferential limb measurements revealed UE variance WNL of dominant vs non-dominant extremity (up to 8 mm difference) except for circumference at 10 cm above olecranon process where RUE was 17 mm larger than LUE. Variance between lower extremities was WNL (up to 10 mm difference) except at ankle/malleoli where right was 17 mm larger than left. Patient describes no significant functional limitations due to her pain (described using her mouse at work sometimes difficult/painful) and she self-prescribed to stop her walking program as she was afraid it would make her symptoms worse. We discussed potential benefits of exercise (including exercise in a pool-walking, water aerobics, simple ROM exercises-where hydrostatic pressure gradient could assist with management of edema) and she agreed to at least resume her walking program and keep record of effects on her symptoms to be able to share with physician. She will consider pool exercise as she has access to the pool at her gym. She also agreed to resume her  gabapentin as prescribed by her physician. Again to keep record of the effects on her symptoms to share with physician. In review of the testing she has undergone in the past seven years, it was noted that she has not had a neurology consult. As her 11/18/15 MRI Brain showed "At least two subcentimeter foci of white matter signal abnormality LEFT periatrial region (image 16 series 19), and LEFT occipital subcortical white matter (same slice), uncertain significance. Chronic infection, complicated migraine, vasculitis, demyelinating disease, or idiopathic are all considerations" and her pain does include episodes of burning, would recommend consideration of neurology consult. (Note: results of MRI not discussed with patient as unclear if she was made aware of these findings originally). We discussed current Physical Therapy interventions would include instruction in home exercise program for stretching, potential edema management, and overall increased activity/exercise. Patient felt she could do this on her own and politely declined further PT services. She agreed to follow-up with her PCP regarding possible referral to neurology. No further PT indicated at this time.   History and Personal Factors relevant to plan of care: none relevant to POC   Clinical Presentation Stable   Clinical Decision Making Low   Recommended Other Services Neurology consult   Consulted and Agree with Plan of Care Patient    Past Medical History:  Diagnosis Date  . Allergic rhinitis   . Borderline hypertension   . Breastfeeding (infant)   . GERD (gastroesophageal reflux disease)   . Heart murmur    MILD -  ASYMPTOMATIC  . History of abnormal cervical  Pap smear   . History of ovarian cyst   . Wears glasses     Past Surgical History:  Procedure Laterality Date  . DIAGNOSTIC LAPAROSCOPY  2009  . LAPAROSCOPIC TUBAL LIGATION Bilateral 02/15/2014   Procedure: LAPAROSCOPIC TUBAL LIGATION WITH FILSHIE CLIPS;  Surgeon:  Margarette Asal, MD;  Location: [facility];  Service: Gynecology;  Laterality: Bilateral;  . TUBAL LIGATION      There were no vitals filed for this visit.       Subjective Assessment - 01/10/17 0855    Subjective Reports her symptoms began after birth of her son (who is now 17 yrs old). She remembers there was a problem with her epidural ("anesthesiologist just jammed it in there. The head of anesthesiology even came by to check on me and apologized for what the other physician had done") and when it wore off, the left side felt normal but she had numbness in her RUE and RLE. She later began having sporadic pain RUE/RLE. Symptoms lasted ~6 months ending when MD started her on Cymbalta. In May 2018 she was carrying a heavy carpet cleaner upstairs and the next day she felt her RUE/RLE were swollen and stiff. Since then the sporadic pain has also returned. She describes the pain as a stiffness primarily, heaviness, and occasional burning. She was recently started on Gabapentin and not sure if it helped, but it made her so sleepy she stopped taking it after 2 days.    Pertinent History mother passed away 07/11/2022; cousin murdered May 2018 (pt clearly remembers her symptoms started 2 days before her cousin was murdered because she went to the ED for treatment of symptoms 2 days prior to cousin's death)   Patient Stated Goals find the reason this is happening; get rt side feeling normal   Currently in Pain? No/denies  denies, but then describes symptoms below   Pain Score --  pt could not rate   Pain Location Arm   Pain Orientation Right   Pain Descriptors / Indicators Burning;Heaviness  stiffness   Pain Type Acute pain   Pain Radiating Towards denies radiating; reports it "jumps around"   Pain Onset More than a month ago   Pain Frequency Intermittent   Aggravating Factors  unknown   Pain Relieving Factors stretch, move; using ice pack   Effect of Pain on Daily Activities  not exercising; at work it hurts when moving mouse for a long time   Multiple Pain Sites Yes   Pain Score --  unable to rate when asked   Pain Location Leg   Pain Orientation Right   Pain Descriptors / Indicators Burning;Heaviness;Other (Comment)  stiffness; all other information related to UE pain is the same for LE            [facility] PT Assessment - 01/10/17 1312      Assessment   Medical Diagnosis Nerve pain   Referring Provider Rory Percy DO   Onset Date/Surgical Date --  May 2018   Hand Dominance Right   Prior Therapy none     Precautions   Precautions None     Restrictions   Weight Bearing Restrictions No     Balance Screen   Has the patient fallen in the past 6 months No     Prior Function   Level of Independence Independent   Vocation Full time employment   Vocation Requirements [employer]; customer service    Leisure walking, church, Y for  walking (does not use pool because of strong chlorine)     Cognition   Overall Cognitive Status Within Functional Limits for tasks assessed     Observation/Other Assessments   Observations normal gait in flip-flops as walking from lobby to exam room   Focus on Therapeutic Outcomes (FOTO)  Functional Status score: patient score 74; FOTO risk adjusted score 61     Observation/Other Assessments-Edema    Edema Circumferential Site   Right (dominant) Left  ulnar process  151mm   131mm  cubital fossa  265   260  10 cm above   309   292 cubital fossa  malleoli  254   237  tibial tuberosity  345   340  5 cm above  445   435 patella       Sensation   Light Touch Appears Intact     Coordination   Gross Motor Movements are Fluid and Coordinated Yes     Posture/Postural Control   Posture/Postural Control No significant limitations     Tone   Assessment Location Right Lower Extremity     ROM / Strength   AROM / PROM / Strength AROM;Strength     AROM   Overall AROM  Within functional limits for  tasks performed  LE's    AROM Assessment Site Hip     Strength   Overall Strength Within functional limits for tasks performed  LEs     RLE Tone   RLE Tone Within Functional Limits            Objective measurements completed on examination: See above findings.                  PT Education - 01/11/17 0734    Education provided Yes   Education Details symptoms are not consistent with a "pinched nerve" (involves UE and LE equally; primary description of pain is stiffness and heaviness; sensation of edema), however burning pain is consistent with nerve pain; plan to recommend referral to neurologist; role of exercise and would be beneficial for her to resume her walking program she did prior to onset of symptoms and note either positive or negative effect on symptoms    Person(s) Educated Patient   Methods Explanation   Comprehension Verbalized understanding                      Visit Diagnosis: Other symptoms and signs involving the nervous system - Plan: PT plan of care cert/re-cert     Problem List Patient Active Problem List   Diagnosis Date Noted  . Nerve pain 12/06/2016  . Neck swelling 12/06/2016  . Grief 10/09/2016  . Major depression 03/08/2016  . Vitamin D deficiency 11/10/2015  . 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 Sebaceous cyst of right axilla 09/16/2015  . Other social stressor 11/26/2012  . GERD (gastroesophageal reflux disease) 03/19/2011  . Chronic pain syndrome 02/12/2011    Rexanne Mano, PT 01/11/2017, 8:35 AM  [facility] [address] [place], [place], [address] Phone: [phone]   Fax:  [phone]  Name: JORDEN MAHL MRN: [id number] Date of Birth: July 31, 1980

## 2017-01-14 ENCOUNTER — Ambulatory Visit (INDEPENDENT_AMBULATORY_CARE_PROVIDER_SITE_OTHER): Payer: BLUE CROSS/BLUE SHIELD | Admitting: Internal Medicine

## 2017-01-14 VITALS — BP 128/94 | HR 73 | Temp 98.4°F | Wt 167.0 lb

## 2017-01-14 DIAGNOSIS — G894 Chronic pain syndrome: Secondary | ICD-10-CM

## 2017-01-14 MED ORDER — FERROUS SULFATE 325 (65 FE) MG PO TABS: 325.0000 mg | ORAL_TABLET | Freq: Every day | ORAL | 2 refills | Status: DC

## 2017-01-14 MED ORDER — DULOXETINE HCL 20 MG PO CPEP: 20.0000 mg | ORAL_CAPSULE | Freq: Every day | ORAL | 2 refills | Status: DC

## 2017-01-14 NOTE — Progress Notes (Signed)
[facility] Progress Note  Subjective:  Jenna Mcclure is a 36 y.o. female with history of GERD, facial nerve palsy, vitamin D deficiency, and anemia who presents for R-sided swelling and discomfort. She was seen for this 12/06/16 and started on gabapentin and referred to PT. The physical therapist she saw noted UE and LE size discrepancy between R and L side was WNL; continued exercise recommended rather than further PT sessions. Patient says she stopped taking gabapentin over the weekend because it made her too groggy. She previously had improvement in burning sensation with cymbalta but stopped taking due to nausea. She is not currently taking any antidepressants, as she says she doesn't need them at this point in her grief from loss of her mother from bladder cancer. She reports she first noticed R-sided pain after epidural given during childbirth about 6 years ago. Sensation of swelling and heaviness, however, is newer as of this May. She was seen last summer for episode of facial numbness and had MRI brain with "two small foci of white matter signal abnormality in left hemisphere, uncertain significance" not thought to be cause of symptoms, which had resolved. Thinks it has been a little more difficult to swallow. Symptoms do not impact her mobility, but she is worried about not having an explanation. Denies family history of MS. Notes she has an uncle who is in a wheelchair but says this has been since he was a child.  ROS: No visual changes, no falls  Allergies  Allergen Reactions  . Miconazole Nitrate Hives, Itching and Rash   Social: Never smoker  Objective: Blood pressure (!) 128/94, pulse 73, temperature 98.4 F (36.9 C), temperature source Oral, weight 167 lb (75.8 kg), last menstrual period 12/28/2016. Body mass index is 30.54 kg/m. Constitutional: Obese female, in NAD HENT: MMM.  Musculoskeletal: No obvious size discrepancy between right and left sides. No edema of  extremities. No midline spinal TTP.  Neurological: AOx3, no focal deficits. Patient reports cold sensation somewhat decreased on R UEs and LEs compared to L, though sharp and soft sensation intact. Strength 5/5 and symmetric. Reflexes 3+ and symmetric.  Skin: Skin is warm and dry. No rash noted.  Psychiatric: Anxious mood.  Vitals reviewed  Assessment/Plan: Chronic pain syndrome - Unclear etiology. Describes nerve-type pain with burning and sensation of heaviness of R extremities. Could be peripheral neuropathy vs pinched nerve. MRI from 2017 with small enhancements of unknown significance. No family history of MS or visual changes. Possible new dysphagia.  - Placed referral to Neurology. - Recommended restarting cymbalta and taking with a meal to help with pain.  - Reviewed last year's MRI results with patient.   Follow-up after Neurology appointment.  Olene Floss, MD [place], PGY-3

## 2017-01-14 NOTE — Patient Instructions (Addendum)
Ms. Mokry,   I will refer you to Neurology.  Please restart cymbalta. Take with a meal.  Start iron once a day after a meal.  Please see [place] back after the Neurology appointment.  Best, Dr. Ola Spurr

## 2017-01-15 NOTE — Assessment & Plan Note (Addendum)
-   Unclear etiology. Describes nerve-type pain with burning and sensation of heaviness of R extremities. Could be peripheral neuropathy vs pinched nerve. MRI from 2017 with small enhancements of unknown significance. No family history of MS or visual changes. Possible new dysphagia.  - Placed referral to Neurology. - Recommended restarting cymbalta and taking with a meal to help with pain.  - Reviewed last year's MRI results with patient.

## 2017-02-11 DIAGNOSIS — N760 Acute vaginitis: Secondary | ICD-10-CM | POA: Diagnosis not present

## 2017-02-11 DIAGNOSIS — J019 Acute sinusitis, unspecified: Secondary | ICD-10-CM | POA: Diagnosis not present

## 2017-02-13 DIAGNOSIS — R29898 Other symptoms and signs involving the musculoskeletal system: Secondary | ICD-10-CM | POA: Diagnosis not present

## 2017-02-13 DIAGNOSIS — F329 Major depressive disorder, single episode, unspecified: Secondary | ICD-10-CM | POA: Diagnosis not present

## 2017-02-13 DIAGNOSIS — M5441 Lumbago with sciatica, right side: Secondary | ICD-10-CM | POA: Diagnosis not present

## 2017-02-17 ENCOUNTER — Emergency Department (HOSPITAL_COMMUNITY)
Admission: EM | Admit: 2017-02-17 | Discharge: 2017-02-17 | Disposition: A | Discharge status: Home / Self Care | Payer: BLUE CROSS/BLUE SHIELD | Attending: Emergency Medicine | Admitting: Emergency Medicine

## 2017-02-17 ENCOUNTER — Encounter (HOSPITAL_COMMUNITY): Payer: Self-pay | Admitting: *Deleted

## 2017-02-17 ENCOUNTER — Ambulatory Visit: Payer: BLUE CROSS/BLUE SHIELD | Admitting: Internal Medicine

## 2017-02-17 DIAGNOSIS — R51 Headache: Secondary | ICD-10-CM | POA: Diagnosis not present

## 2017-02-17 DIAGNOSIS — R200 Anesthesia of skin: Secondary | ICD-10-CM | POA: Diagnosis not present

## 2017-02-17 DIAGNOSIS — R011 Cardiac murmur, unspecified: Secondary | ICD-10-CM | POA: Insufficient documentation

## 2017-02-17 DIAGNOSIS — J0140 Acute pansinusitis, unspecified: Secondary | ICD-10-CM

## 2017-02-17 DIAGNOSIS — Z79899 Other long term (current) drug therapy: Secondary | ICD-10-CM | POA: Insufficient documentation

## 2017-02-17 DIAGNOSIS — I10 Essential (primary) hypertension: Secondary | ICD-10-CM | POA: Diagnosis not present

## 2017-02-17 DIAGNOSIS — R42 Dizziness and giddiness: Secondary | ICD-10-CM | POA: Insufficient documentation

## 2017-02-17 DIAGNOSIS — R0981 Nasal congestion: Secondary | ICD-10-CM | POA: Diagnosis not present

## 2017-02-17 MED ORDER — IBUPROFEN 800 MG PO TABS: 800.0000 mg | ORAL_TABLET | Freq: Three times a day (TID) | ORAL | 0 refills | Status: DC

## 2017-02-17 MED ORDER — AMOXICILLIN-POT CLAVULANATE 875-125 MG PO TABS: 1.0000 | ORAL_TABLET | Freq: Two times a day (BID) | ORAL | 0 refills | Status: AC

## 2017-02-17 MED ORDER — KETOROLAC TROMETHAMINE 60 MG/2ML IM SOLN
60.0000 mg | Freq: Once | INTRAMUSCULAR | Status: AC
  Administered 2017-02-17: 60 mg via INTRAMUSCULAR
  Filled 2017-02-17: qty 2

## 2017-02-17 MED ORDER — AMOXICILLIN-POT CLAVULANATE 875-125 MG PO TABS
1.0000 | ORAL_TABLET | Freq: Once | ORAL | Status: AC
  Administered 2017-02-17: 1 via ORAL
  Filled 2017-02-17: qty 1

## 2017-02-17 NOTE — ED Triage Notes (Addendum)
To ED for eval of continued HA, face pain, and tongue/face numbness, weakness on right side of body. Pt states she has seen multiple doctors for these symptoms. Sent to neurology and had MRI last year. Her PMD told her her films were inconclusive - this was 6/17 and done at [facility] imaging. Told by her new PCP to return to neurology for right arm and leg swelling intermittent since May- pt has appt 03/19/17. They've put her recently on abx and otc mucinex for possible sinus infection, which she has been taking. States she doesn't feel congested- but does feel right side face pressure. No neuro deficits noted. States she wouldn't have come to the ED today but still having pain, still having numbness after taking OTC and prescribed meds. Appears to not feel well. States she doesn't know what it is but 'something is wrong'.  Ambulatory without difficulty.

## 2017-02-17 NOTE — ED Notes (Signed)
EDP at bedside  

## 2017-02-17 NOTE — ED Provider Notes (Signed)
[facility] DEPT Provider Note   CSN: [id number] Arrival date & time: 02/17/17  1303     History   Chief Complaint Chief Complaint  Patient presents with  . Nasal Congestion  . Headache  . facial numbness  . arm and leg swelling    HPI Jenna Mcclure is a 36 y.o. female with a h/o of sinusitis who presents to the emergency department with a chief complaint of right-sided sinus pressure and pain with associated dizziness and "ears popping" that began 2 weeks ago. She reports that she was evaluated at urgent care 1 week ago and was discharged with a course of prednisone, which she completed last night, and amoxicillin, which she reports that she is continuing to take. She reports that she felt her symptoms were improving until today, when her right-sided sinus pressure worsened. She reports that she has also been treating her symptoms at home with Mucinex without improvement.  She reports a history of chronic numbness to her face and right side of her body and is scheduled for an initial evaluation with neurology on 03/19/2017. She reports no change to her chronic symptoms. No new weakness, numbness, fever, chills. She denies headache. No upper or lower extremity swelling. No photophobia, phonophobia, or visual changes.  She reports that she just changed PCPs. First visit with [facility] 5 days ago. She is scheduled to follow up with her new primary care physician after her neurology evaluation in October.  The history is provided by the patient. No language interpreter was used.    Past Medical History:  Diagnosis Date  . Allergic rhinitis   . Borderline hypertension   . Breastfeeding (infant)   . GERD (gastroesophageal reflux disease)   . Heart murmur    MILD -  ASYMPTOMATIC  . History of abnormal cervical Pap smear   . History of ovarian cyst   . Wears glasses     Patient Active Problem List   Diagnosis Date Noted  . Nerve pain 12/06/2016  . Neck swelling 12/06/2016  .  Grief 10/09/2016  . Major depression 03/08/2016  . Vitamin D deficiency 11/10/2015  . 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 Sebaceous cyst of right axilla 09/16/2015  . Other social stressor 11/26/2012  . GERD (gastroesophageal reflux disease) 03/19/2011  . Chronic pain syndrome 02/12/2011    Past Surgical History:  Procedure Laterality Date  . DIAGNOSTIC LAPAROSCOPY  2009  . LAPAROSCOPIC TUBAL LIGATION Bilateral 02/15/2014   Procedure: LAPAROSCOPIC TUBAL LIGATION WITH FILSHIE CLIPS;  Surgeon: Margarette Asal, MD;  [facility];  Service: Gynecology;  Laterality: Bilateral;  . TUBAL LIGATION      OB History    Gravida Para Term Preterm AB Living   6 6 6  0 0 6   SAB TAB Ectopic Multiple Live Births   0 0 0 0 6       Home Medications    Prior to Admission medications   Medication Sig Start Date End Date Taking? Authorizing Provider  amoxicillin-clavulanate (AUGMENTIN) 875-125 MG tablet Take 1 tablet by mouth every 12 (twelve) hours. 02/17/17 02/27/17  Jeryn Bertoni A, PA-C  cetirizine (ZYRTEC) 10 MG tablet Take 1 tablet (10 mg total) by mouth daily. Patient not taking: Reported on 01/11/2017 07/12/15   Ivar Drape D, PA  Cholecalciferol (VITAMIN D) 2000 UNITS CAPS Take 2,000 Units by mouth daily.    [provider]  DULoxetine (CYMBALTA) 20 MG capsule Take 1 capsule (20 mg total) by mouth daily.  01/14/17   Rogue Bussing, MD  DULoxetine (CYMBALTA) 20 MG capsule Take 1 capsule (20 mg total) by mouth daily. 01/14/17   Rogue Bussing, MD  ferrous sulfate 325 (65 FE) MG tablet Take 1 tablet (325 mg total) by mouth daily. 01/14/17   Rogue Bussing, MD  gabapentin (NEURONTIN) 300 MG capsule TAKE ONE CAPSULE EVERY DAY 01/07/17   Rory Percy, DO  ibuprofen (ADVIL,MOTRIN) 800 MG tablet Take 1 tablet (800 mg total) by mouth 3 (three) times daily. 02/17/17   Annika Selke A, PA-C  ondansetron (ZOFRAN-ODT) 4 MG disintegrating tablet  Take 1 tablet (4 mg total) by mouth every 8 (eight) hours as needed for nausea or vomiting. Patient not taking: Reported on 01/11/2017 06/14/16   Katheren Shams, DO  pantoprazole (PROTONIX) 20 MG tablet Take 1 tablet (20 mg total) by mouth daily. 09/16/16   Katheren Shams, DO  triamcinolone (NASACORT) 55 MCG/ACT AERO nasal inhaler Place 2 sprays into the nose daily. Patient not taking: Reported on 01/11/2017 07/12/15   Ivar Drape D, PA  Vitamin D, Ergocalciferol, (DRISDOL) 50000 units CAPS capsule Take 1 capsule (50,000 Units total) by mouth every 7 (seven) days. 12/09/16   Rory Percy, DO    Family History Family History  Problem Relation Age of Onset  . Hypertension Mother   . Cancer Mother        bladder  . Cancer Father        leaukemia  . Hypertension Maternal Aunt   . Diabetes Maternal Uncle   . Diabetes Maternal Grandmother   . Diabetes Paternal Grandmother     Social History Social History  Substance Use Topics  . Smoking status: Never Smoker  . Smokeless tobacco: Never Used  . Alcohol use Yes     Comment: RARE     Allergies   Miconazole nitrate   Review of Systems Review of Systems  Constitutional: Negative for activity change, chills, diaphoresis and fever.  HENT: Positive for congestion, sinus pain and sinus pressure. Negative for ear discharge, ear pain, facial swelling, rhinorrhea and sore throat.   Eyes: Negative for visual disturbance.  Respiratory: Negative for shortness of breath.   Cardiovascular: Negative for chest pain.  Gastrointestinal: Negative for abdominal pain, diarrhea, nausea and vomiting.  Genitourinary: Negative for dysuria.  Musculoskeletal: Negative for back pain, neck pain and neck stiffness.  Skin: Negative for rash.  Allergic/Immunologic: Negative for immunocompromised state.  Neurological: Positive for dizziness. Negative for syncope, weakness, light-headedness, numbness and headaches.  Psychiatric/Behavioral: Negative for  confusion.   Physical Exam Updated Vital Signs BP (!) 163/95 (BP Location: Right Arm)   Pulse 70   Temp 98 F (36.7 C) (Oral)   Resp 14   SpO2 99%   Physical Exam  Constitutional: No distress.  HENT:  Head: Normocephalic.  Right Ear: A middle ear effusion is present.  Left Ear: A middle ear effusion is present.  Nose: Mucosal edema present. Right sinus exhibits maxillary sinus tenderness and frontal sinus tenderness. Left sinus exhibits no maxillary sinus tenderness and no frontal sinus tenderness.  Mouth/Throat: Uvula is midline and mucous membranes are normal. Posterior oropharyngeal erythema present. No oropharyngeal exudate or posterior oropharyngeal edema.  Eyes: Pupils are equal, round, and reactive to light. Conjunctivae and EOM are normal.  Neck: Normal range of motion. Neck supple.  Cardiovascular: Normal rate, regular rhythm, normal heart sounds and intact distal pulses.  Exam reveals no gallop and no friction rub.   No murmur heard.  Pulmonary/Chest: Effort normal and breath sounds normal. No respiratory distress. She has no wheezes. She has no rales.  Abdominal: Soft. She exhibits no distension.  Lymphadenopathy:    She has no cervical adenopathy.  Neurological: She is alert.  Cranial nerves 2-12 intact. Finger-to-nose is normal. 5/5 motor strength of the bilateral upper and lower extremities. Moves all four extremities. Negative Romberg. Ambulatory without difficulty. NVI.    Skin: Skin is warm. No rash noted.  Psychiatric: Her behavior is normal.  Nursing note and vitals reviewed.    ED Treatments / Results  Labs (all labs ordered are listed, but only abnormal results are displayed) Labs Reviewed - No data to display  EKG  EKG Interpretation None       Radiology No results found.  Procedures Procedures (including critical care time)  Medications Ordered in ED Medications  ketorolac (TORADOL) injection 60 mg (60 mg Intramuscular Given 02/17/17 1754)    amoxicillin-clavulanate (AUGMENTIN) 875-125 MG per tablet 1 tablet (1 tablet Oral Given 02/17/17 1753)     Initial Impression / Assessment and Plan / ED Course  I have reviewed the triage vital signs and the nursing notes.  Pertinent labs & imaging results that were available during my care of the patient were reviewed by me and considered in my medical decision making (see chart for details).     Patient with a history of chronic numbness and right-sided weakness who has a pending consultation with neurology next months who is complaining of symptoms of sinusitis.  No new neurological symptoms. Normal neurological exam. Toradol given in the emergency department with moderate improvement of the patient's sinus pressure and pain.   Double worsening symptoms over the last day. Symptoms initially began more than 10 days with purulent nasal discharge and maxillary sinus pain.  Concern for acute bacterial rhinosinusitis.  Patient discharged with Augmentin.  Instructions given for warm saline nasal wash and recommendations for follow-up with primary care physician for recheck. Strict return precautions. NAD. VSS. The patient is safe for discharge at this time.  Final Clinical Impressions(s) / ED Diagnoses   Final diagnoses:  Acute non-recurrent pansinusitis    New Prescriptions Discharge Medication List as of 02/17/2017  6:26 PM    START taking these medications   Details  amoxicillin-clavulanate (AUGMENTIN) 875-125 MG tablet Take 1 tablet by mouth every 12 (twelve) hours., Starting Mon 02/17/2017, Until Thu 02/27/2017, Print         Grete Bosko A, PA-C 02/18/17 0033    Tegeler, Gwenyth Allegra, MD 02/18/17 1110

## 2017-02-17 NOTE — Discharge Instructions (Signed)
Please take Augmentin once every 12 hours for the next 10 days. Your first dose was given in the emergency department.  I have attached instructions on how to perform a sinus rinse. This can help with your sinus pressure and congestion.    Please use caution with over-the-counter products such as Mucinex. They contain ingredients that can cause your blood pressure to increase.  Please schedule a follow-up appointment with your primary care provider for a recheck in the next 3-4 days to ensure that her symptoms are improving.  If you develop new or worsening symptoms including dizziness, weakness, new numbness, chest pain, or if your symptoms worsen despite taking antibiotics after 72 hours, please return to the emergency department for reevaluation.

## 2017-02-21 DIAGNOSIS — R0981 Nasal congestion: Secondary | ICD-10-CM | POA: Diagnosis not present

## 2017-02-22 ENCOUNTER — Emergency Department (HOSPITAL_COMMUNITY)
Admission: EM | Admit: 2017-02-22 | Discharge: 2017-02-22 | Discharge status: Against Medical Advice | Payer: BLUE CROSS/BLUE SHIELD

## 2017-02-22 NOTE — ED Notes (Signed)
Patient called x3 no answer from lobby

## 2017-03-19 ENCOUNTER — Ambulatory Visit (INDEPENDENT_AMBULATORY_CARE_PROVIDER_SITE_OTHER): Payer: BLUE CROSS/BLUE SHIELD | Admitting: Diagnostic Neuroimaging

## 2017-03-19 ENCOUNTER — Encounter: Payer: Self-pay | Admitting: Diagnostic Neuroimaging

## 2017-03-19 VITALS — BP 136/87 | HR 100 | Ht 62.5 in | Wt 171.4 lb

## 2017-03-19 DIAGNOSIS — R202 Paresthesia of skin: Secondary | ICD-10-CM

## 2017-03-19 DIAGNOSIS — R200 Anesthesia of skin: Secondary | ICD-10-CM

## 2017-03-19 NOTE — Progress Notes (Signed)
[facility]  PATIENT: Jenna Mcclure DOB: June 06, 1980  REFERRING CLINICIAN: H Fitzgerald HISTORY FROM: patient and chart review  REASON FOR VISIT: new consult    HISTORICAL  CHIEF COMPLAINT:  Chief Complaint  Patient presents with  . Neuralgia, neuritis    rm 6, New Pt, "swelling, numbness, pain, weakness on right side on and off since epidural 7 years ago"    HISTORY OF PRESENT ILLNESS:   36 year old female here for evaluation of right-sided numbness, swelling, pain, weakness.  7 years ago patient had epidural anesthesia for childbirth and following this patient developed right leg pain. She had evaluation and treatment with Cymbalta. Symptoms improved.  One year ago patient had new onset of intermittent right facial numbness. She also had some right TMJ pain.  In the past 1 year patient also has noted some subjective swelling, pain, abnormal sensation in her right neck and shoulder region.  No other specific triggering or aggravating factors, except several tragedies in her family including her mother passing away in January 2018, and her cousin being murdered May 2018. Patient has history of depression anxiety. Review of systems notable for numbness, weakness, aching muscles.   REVIEW OF SYSTEMS: Full 14 system review of systems performed and negative with exception of: As per history of present illness.   ALLERGIES: Allergies  Allergen Reactions  . Miconazole Nitrate Hives, Itching and Rash    HOME MEDICATIONS: Outpatient Medications Prior to Visit  Medication Sig Dispense Refill  . cetirizine (ZYRTEC) 10 MG tablet Take 1 tablet (10 mg total) by mouth daily. 30 tablet 11  . Cholecalciferol (VITAMIN D) 2000 UNITS CAPS Take 2,000 Units by mouth daily.    . ferrous sulfate 325 (65 FE) MG tablet Take 1 tablet (325 mg total) by mouth daily. 30 tablet 2  . gabapentin (NEURONTIN) 300 MG capsule TAKE ONE CAPSULE EVERY DAY 30 capsule 0  . ibuprofen  (ADVIL,MOTRIN) 800 MG tablet Take 1 tablet (800 mg total) by mouth 3 (three) times daily. 21 tablet 0  . pantoprazole (PROTONIX) 20 MG tablet Take 1 tablet (20 mg total) by mouth daily. 30 tablet 3  . Vitamin D, Ergocalciferol, (DRISDOL) 50000 units CAPS capsule Take 1 capsule (50,000 Units total) by mouth every 7 (seven) days. 15 capsule 0  . triamcinolone (NASACORT) 55 MCG/ACT AERO nasal inhaler Place 2 sprays into the nose daily. (Patient not taking: Reported on 03/19/2017) 1 Inhaler 1  . DULoxetine (CYMBALTA) 20 MG capsule Take 1 capsule (20 mg total) by mouth daily. 30 capsule 2  . DULoxetine (CYMBALTA) 20 MG capsule Take 1 capsule (20 mg total) by mouth daily. 30 capsule 2  . ondansetron (ZOFRAN-ODT) 4 MG disintegrating tablet Take 1 tablet (4 mg total) by mouth every 8 (eight) hours as needed for nausea or vomiting. (Patient not taking: Reported on 01/11/2017) 20 tablet 0   No facility-administered medications prior to visit.     PAST MEDICAL HISTORY: Past Medical History:  Diagnosis Date  . Allergic rhinitis   . Borderline hypertension   . Breastfeeding (infant)   . GERD (gastroesophageal reflux disease)   . Heart murmur    MILD -  ASYMPTOMATIC  . History of abnormal cervical Pap smear   . History of ovarian cyst   . TMJ (dislocation of temporomandibular joint)   . Wears glasses     PAST SURGICAL HISTORY: Past Surgical History:  Procedure Laterality Date  . DIAGNOSTIC LAPAROSCOPY  2009  . LAPAROSCOPIC TUBAL LIGATION Bilateral 02/15/2014  Procedure: LAPAROSCOPIC TUBAL LIGATION WITH FILSHIE CLIPS;  Surgeon: Margarette Asal, MD;  Location: [facility];  Service: Gynecology;  Laterality: Bilateral;    FAMILY HISTORY: Family History  Problem Relation Age of Onset  . Hypertension Mother   . Cancer Mother        bladder  . Cancer Father        leaukemia  . Hypertension Maternal Aunt   . Diabetes Maternal Uncle   . Diabetes Maternal Grandmother   .  Diabetes Paternal Grandmother     SOCIAL HISTORY:  Social History   Social History  . Marital status: Married    Spouse name: N/A  . Number of children: 6  . Years of education: 62   Occupational History  .      [employer]   Social History Main Topics  . Smoking status: Never Smoker  . Smokeless tobacco: Never Used  . Alcohol use Yes     Comment: RARE  . Drug use: No  . Sexual activity: Yes    Birth control/ protection: Surgical   Other Topics Concern  . Not on file   Social History Narrative   Lives with family   Mother died 07/23/2016 from cancer.   Caffeine - 2 x weekly     PHYSICAL EXAM  GENERAL EXAM/CONSTITUTIONAL: Vitals:  Vitals:   03/19/17 1039  BP: 136/87  Pulse: 100  Weight: 171 lb 6.4 oz (77.7 kg)  Height: 5' 2.5" (1.588 m)     Body mass index is 30.85 kg/m.  No exam data present  Patient is in no distress; well developed, nourished and groomed; neck is supple  CARDIOVASCULAR:  Examination of carotid arteries is normal; no carotid bruits  Regular rate and rhythm, no murmurs  Examination of peripheral vascular system by observation and palpation is normal  EYES:  Ophthalmoscopic exam of optic discs and posterior segments is normal; no papilledema or hemorrhages  MUSCULOSKELETAL:  Gait, strength, tone, movements noted in Neurologic exam below  NEUROLOGIC: MENTAL STATUS:  No flowsheet data found.  awake, alert, oriented to person, place and time  recent and remote memory intact  normal attention and concentration  language fluent, comprehension intact, naming intact,   fund of knowledge appropriate  CRANIAL NERVE:   2nd - no papilledema on fundoscopic exam  2nd, 3rd, 4th, 6th - pupils equal and reactive to light, visual fields full to confrontation, extraocular muscles intact, no nystagmus  5th - facial sensation symmetric  7th - facial strength symmetric  8th - hearing intact  9th - palate elevates  symmetrically, uvula midline  11th - shoulder shrug symmetric  12th - tongue protrusion midline  MOTOR:   normal bulk and tone, full strength in the BUE, BLE  SENSORY:   normal and symmetric to light touch, temperature, vibration  SUBJECTIVE NUMBNESS IN RIGHT FOOT  COORDINATION:   finger-nose-finger, fine finger movements normal  REFLEXES:   deep tendon reflexes present and symmetric  GAIT/STATION:   narrow based gait; able to walk tandem; romberg is negative    DIAGNOSTIC DATA (LABS, IMAGING, TESTING) - I reviewed patient records, labs, notes, testing and imaging myself where available.  Lab Results  Component Value Date   WBC 6.5 10/18/2016   HGB 10.4 (L) 10/18/2016   HCT 32.2 (L) 10/18/2016   MCV 78.7 10/18/2016   PLT 316 10/18/2016      Component Value Date/Time   NA 139 10/18/2016 0910   K 3.9 10/18/2016 0910  CL 109 10/18/2016 0910   CO2 22 10/18/2016 0910   GLUCOSE 101 (H) 10/18/2016 0910   BUN 7 10/18/2016 0910   CREATININE 0.78 10/18/2016 0910   CREATININE 0.86 11/09/2015 1508   CALCIUM 8.9 10/18/2016 0910   PROT 6.8 10/18/2016 0910   ALBUMIN 3.7 10/18/2016 0910   AST 20 10/18/2016 0910   ALT 13 (L) 10/18/2016 0910   ALKPHOS 55 10/18/2016 0910   BILITOT 0.5 10/18/2016 0910   GFRNONAA >60 10/18/2016 0910   GFRAA >60 10/18/2016 0910   Lab Results  Component Value Date   CHOL 190 07/12/2012   HDL 61 07/12/2012   LDLCALC 116 (H) 07/12/2012   TRIG 65 07/12/2012   CHOLHDL 3.1 07/12/2012   No results found for: HGBA1C Lab Results  Component Value Date   FKCLEXNT70 017 12/06/2016   Lab Results  Component Value Date   TSH 1.510 12/06/2016    11/18/15 MRI / MRA head [I reviewed images myself and agree with interpretation. 2 minimal foci are non-specific. -VRP]  - No acute intracranial findings. No abnormal postcontrast enhancement. No cause is seen for the reported facial numbness. - Two small foci of white matter signal abnormality in  the LEFT hemisphere, uncertain significance. See discussion above. - Negative MRA intracranial circulation.     ASSESSMENT AND PLAN  36 y.o. year old female here with intermittent right face, right arm, right leg numbness, tingling, pain, subjective swelling, which are independent of each other. Will proceed with further workup.   Ddx: CNS autoimmune, inflamm, metabolic, degenerative polyradiculopathy, myofascial pain, stress reaction  1. Numbness and tingling      PLAN: - check MRI brain and cervical spine - continue gabapentin 300mg  at bedtime (as needed)  Orders Placed This Encounter  Procedures  . MR BRAIN W WO CONTRAST  . MR CERVICAL SPINE W WO CONTRAST   Return in about 3 months (around 06/19/2017).    Penni Bombard, MD 49/44/9675, 91:63 AM Certified in Neurology, Neurophysiology and Neuroimaging  [facility] [address] [place], [place] [address] [phone]

## 2017-03-24 ENCOUNTER — Telehealth: Payer: Self-pay | Admitting: Diagnostic Neuroimaging

## 2017-03-24 MED ORDER — ALPRAZOLAM 0.5 MG PO TABS: 0.5000 mg | ORAL_TABLET | ORAL | 0 refills | Status: DC | PRN

## 2017-03-24 NOTE — Telephone Encounter (Signed)
Patient is schedule to have her MRI done on 04/01/17 at the [facility] mobile unit. But she did inform me that she is claustrophobic and would like something to help her nerves.

## 2017-03-24 NOTE — Telephone Encounter (Signed)
Ok for xanax. -VRP 

## 2017-03-25 NOTE — Telephone Encounter (Addendum)
Xanax Rx successfully faxed to [facility],  [address]  LVM informing patient.

## 2017-04-01 ENCOUNTER — Ambulatory Visit (INDEPENDENT_AMBULATORY_CARE_PROVIDER_SITE_OTHER): Payer: BLUE CROSS/BLUE SHIELD

## 2017-04-01 DIAGNOSIS — R200 Anesthesia of skin: Secondary | ICD-10-CM

## 2017-04-01 DIAGNOSIS — R202 Paresthesia of skin: Secondary | ICD-10-CM | POA: Diagnosis not present

## 2017-04-01 MED ORDER — GADOPENTETATE DIMEGLUMINE 469.01 MG/ML IV SOLN: 15.0000 mL | Freq: Once | INTRAVENOUS | Status: AC | PRN

## 2017-04-02 ENCOUNTER — Other Ambulatory Visit: Payer: BLUE CROSS/BLUE SHIELD

## 2017-04-10 ENCOUNTER — Telehealth: Payer: Self-pay

## 2017-04-10 NOTE — Telephone Encounter (Signed)
I spoke with patient and made her aware of the results. She voiced understanding and appreciation.

## 2017-04-10 NOTE — Telephone Encounter (Signed)
-----   Message from Penni Bombard, MD sent at 04/08/2017  4:13 PM EST ----- Unremarkable imaging results. Please call patient. Continue current plan. -VRP

## 2017-04-29 ENCOUNTER — Other Ambulatory Visit: Payer: Self-pay | Admitting: Obstetrics and Gynecology

## 2017-04-29 DIAGNOSIS — Z6830 Body mass index (BMI) 30.0-30.9, adult: Secondary | ICD-10-CM | POA: Diagnosis not present

## 2017-04-29 DIAGNOSIS — Z01419 Encounter for gynecological examination (general) (routine) without abnormal findings: Secondary | ICD-10-CM | POA: Diagnosis not present

## 2017-04-29 DIAGNOSIS — N6001 Solitary cyst of right breast: Secondary | ICD-10-CM

## 2017-05-02 ENCOUNTER — Ambulatory Visit
Admission: RE | Admit: 2017-05-02 | Discharge: 2017-05-02 | Disposition: A | Discharge status: Home / Self Care | Payer: BLUE CROSS/BLUE SHIELD | Source: Ambulatory Visit | Attending: Obstetrics and Gynecology | Admitting: Obstetrics and Gynecology

## 2017-05-02 ENCOUNTER — Other Ambulatory Visit: Payer: Self-pay | Admitting: Obstetrics and Gynecology

## 2017-05-02 DIAGNOSIS — N6489 Other specified disorders of breast: Secondary | ICD-10-CM | POA: Diagnosis not present

## 2017-05-02 DIAGNOSIS — R2231 Localized swelling, mass and lump, right upper limb: Secondary | ICD-10-CM

## 2017-05-02 DIAGNOSIS — N6001 Solitary cyst of right breast: Secondary | ICD-10-CM

## 2017-05-02 DIAGNOSIS — R928 Other abnormal and inconclusive findings on diagnostic imaging of breast: Secondary | ICD-10-CM | POA: Diagnosis not present

## 2017-06-04 DIAGNOSIS — I10 Essential (primary) hypertension: Secondary | ICD-10-CM | POA: Diagnosis not present

## 2017-06-04 DIAGNOSIS — F329 Major depressive disorder, single episode, unspecified: Secondary | ICD-10-CM | POA: Diagnosis not present

## 2017-06-28 ENCOUNTER — Other Ambulatory Visit: Payer: Self-pay | Admitting: Family Medicine

## 2017-06-28 DIAGNOSIS — M792 Neuralgia and neuritis, unspecified: Secondary | ICD-10-CM

## 2017-07-01 DIAGNOSIS — M7989 Other specified soft tissue disorders: Secondary | ICD-10-CM | POA: Diagnosis not present

## 2017-07-01 DIAGNOSIS — Z131 Encounter for screening for diabetes mellitus: Secondary | ICD-10-CM | POA: Diagnosis not present

## 2017-07-01 DIAGNOSIS — M5441 Lumbago with sciatica, right side: Secondary | ICD-10-CM | POA: Diagnosis not present

## 2017-07-09 DIAGNOSIS — R102 Pelvic and perineal pain: Secondary | ICD-10-CM | POA: Diagnosis not present

## 2017-07-09 DIAGNOSIS — R1031 Right lower quadrant pain: Secondary | ICD-10-CM | POA: Diagnosis not present

## 2017-07-11 ENCOUNTER — Other Ambulatory Visit: Payer: Self-pay

## 2017-07-11 DIAGNOSIS — M7989 Other specified soft tissue disorders: Secondary | ICD-10-CM

## 2017-07-11 DIAGNOSIS — I8310 Varicose veins of unspecified lower extremity with inflammation: Secondary | ICD-10-CM

## 2017-07-30 DIAGNOSIS — Z713 Dietary counseling and surveillance: Secondary | ICD-10-CM | POA: Diagnosis not present

## 2017-07-30 DIAGNOSIS — Z13220 Encounter for screening for lipoid disorders: Secondary | ICD-10-CM | POA: Diagnosis not present

## 2017-07-30 DIAGNOSIS — Z136 Encounter for screening for cardiovascular disorders: Secondary | ICD-10-CM | POA: Diagnosis not present

## 2017-07-30 DIAGNOSIS — Z6831 Body mass index (BMI) 31.0-31.9, adult: Secondary | ICD-10-CM | POA: Diagnosis not present

## 2017-08-07 DIAGNOSIS — N941 Unspecified dyspareunia: Secondary | ICD-10-CM | POA: Diagnosis not present

## 2017-08-07 DIAGNOSIS — N920 Excessive and frequent menstruation with regular cycle: Secondary | ICD-10-CM | POA: Diagnosis not present

## 2017-08-12 ENCOUNTER — Encounter (HOSPITAL_BASED_OUTPATIENT_CLINIC_OR_DEPARTMENT_OTHER): Payer: Self-pay | Admitting: Emergency Medicine

## 2017-08-12 ENCOUNTER — Emergency Department (HOSPITAL_BASED_OUTPATIENT_CLINIC_OR_DEPARTMENT_OTHER)
Admission: EM | Admit: 2017-08-12 | Discharge: 2017-08-12 | Disposition: A | Discharge status: Home / Self Care | Payer: BLUE CROSS/BLUE SHIELD | Attending: Emergency Medicine | Admitting: Emergency Medicine

## 2017-08-12 ENCOUNTER — Other Ambulatory Visit: Payer: Self-pay

## 2017-08-12 DIAGNOSIS — Z5321 Procedure and treatment not carried out due to patient leaving prior to being seen by health care provider: Secondary | ICD-10-CM | POA: Diagnosis not present

## 2017-08-12 DIAGNOSIS — R102 Pelvic and perineal pain: Secondary | ICD-10-CM | POA: Insufficient documentation

## 2017-08-12 DIAGNOSIS — M25551 Pain in right hip: Secondary | ICD-10-CM | POA: Diagnosis not present

## 2017-08-12 NOTE — ED Notes (Signed)
Patient called x2 no answer. 

## 2017-08-12 NOTE — ED Triage Notes (Signed)
Patient state that she is having pain to her right hip, legs and pelvic region. The patient has been to a several drs, to be seen and have treatment for this. The patient states that she has a followup with a vascular surgeon for the pain, and the patient reports that today she can not tolerate the pain further. It is getting worse

## 2017-08-12 NOTE — ED Notes (Signed)
Pt called x1 no answer in lobby

## 2017-08-13 DIAGNOSIS — F329 Major depressive disorder, single episode, unspecified: Secondary | ICD-10-CM | POA: Diagnosis not present

## 2017-08-13 DIAGNOSIS — M5441 Lumbago with sciatica, right side: Secondary | ICD-10-CM | POA: Diagnosis not present

## 2017-08-15 ENCOUNTER — Other Ambulatory Visit: Payer: Self-pay | Admitting: Family Medicine

## 2017-08-15 DIAGNOSIS — M5410 Radiculopathy, site unspecified: Secondary | ICD-10-CM

## 2017-08-15 DIAGNOSIS — M5441 Lumbago with sciatica, right side: Secondary | ICD-10-CM

## 2017-08-19 DIAGNOSIS — R1084 Generalized abdominal pain: Secondary | ICD-10-CM | POA: Diagnosis not present

## 2017-08-21 ENCOUNTER — Other Ambulatory Visit: Payer: Self-pay | Admitting: *Deleted

## 2017-08-25 DIAGNOSIS — R1084 Generalized abdominal pain: Secondary | ICD-10-CM | POA: Diagnosis not present

## 2017-08-25 DIAGNOSIS — M62838 Other muscle spasm: Secondary | ICD-10-CM | POA: Diagnosis not present

## 2017-08-25 DIAGNOSIS — M6289 Other specified disorders of muscle: Secondary | ICD-10-CM | POA: Diagnosis not present

## 2017-08-25 DIAGNOSIS — M6281 Muscle weakness (generalized): Secondary | ICD-10-CM | POA: Diagnosis not present

## 2017-08-26 ENCOUNTER — Ambulatory Visit
Admission: RE | Admit: 2017-08-26 | Discharge: 2017-08-26 | Disposition: A | Discharge status: Home / Self Care | Payer: BLUE CROSS/BLUE SHIELD | Source: Ambulatory Visit | Attending: Family Medicine | Admitting: Family Medicine

## 2017-08-26 DIAGNOSIS — M5441 Lumbago with sciatica, right side: Secondary | ICD-10-CM

## 2017-08-26 DIAGNOSIS — M5410 Radiculopathy, site unspecified: Secondary | ICD-10-CM

## 2017-08-26 DIAGNOSIS — M48061 Spinal stenosis, lumbar region without neurogenic claudication: Secondary | ICD-10-CM | POA: Diagnosis not present

## 2017-09-01 DIAGNOSIS — G629 Polyneuropathy, unspecified: Secondary | ICD-10-CM | POA: Diagnosis not present

## 2017-09-01 DIAGNOSIS — M545 Low back pain: Secondary | ICD-10-CM | POA: Diagnosis not present

## 2017-09-09 ENCOUNTER — Ambulatory Visit (HOSPITAL_COMMUNITY)
Admission: RE | Admit: 2017-09-09 | Discharge: 2017-09-09 | Disposition: A | Discharge status: Home / Self Care | Payer: BLUE CROSS/BLUE SHIELD | Source: Ambulatory Visit | Attending: Vascular Surgery | Admitting: Vascular Surgery

## 2017-09-09 ENCOUNTER — Ambulatory Visit: Payer: BLUE CROSS/BLUE SHIELD | Admitting: Vascular Surgery

## 2017-09-09 ENCOUNTER — Encounter: Payer: Self-pay | Admitting: Vascular Surgery

## 2017-09-09 VITALS — BP 137/84 | HR 96 | Temp 98.0°F | Resp 16 | Ht 62.5 in | Wt 170.0 lb

## 2017-09-09 DIAGNOSIS — M7989 Other specified soft tissue disorders: Secondary | ICD-10-CM

## 2017-09-09 DIAGNOSIS — I8310 Varicose veins of unspecified lower extremity with inflammation: Secondary | ICD-10-CM | POA: Diagnosis not present

## 2017-09-09 NOTE — Progress Notes (Signed)
HISTORY AND PHYSICAL     CC:  Right leg swelling and pain and some right arm swelling Requesting Provider:  Rory Percy, DO  HPI: This is a 37 y.o. female who presents today with c/o right leg swelling and pain.  She states that her pain is not always present and will come on at any time whether she is standing or sitting.  She states her leg aches.  She does not have claudication sx.  She states that the pain will jump around her leg.  This has been going on for about 10 months and has not gotten any better or worse.  She states it does get better with elevating her legs.  She says that her gynecologist told her she has a "swollen uterus".  She was also evalauated by neurology and was told she has a bulging disc but nothing compressive.   She was also evaluated by orthopedics and given stretching exercises, which has helped.  She also states she has intermittent swelling of her right arm but this is not present today.  She has never tried compression stockings.    She has had six pregnancies.  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    She has never smoked.    Past Medical History:  Diagnosis Date  . Allergic rhinitis   . Borderline hypertension   . Breastfeeding (infant)   . GERD (gastroesophageal reflux disease)   . Heart murmur    MILD -  ASYMPTOMATIC  . History of abnormal cervical Pap smear   . History of ovarian cyst   . TMJ (dislocation of temporomandibular joint)   . Wears glasses     Past Surgical History:  Procedure Laterality Date  . DIAGNOSTIC LAPAROSCOPY  2009  . LAPAROSCOPIC TUBAL LIGATION Bilateral 02/15/2014   Procedure: LAPAROSCOPIC TUBAL LIGATION WITH FILSHIE CLIPS;  Surgeon: Margarette Asal, MD;  Location: [facility];  Service: Gynecology;  Laterality: Bilateral;    Allergies  Allergen Reactions  . Miconazole Nitrate Hives, Itching and Rash    Current Outpatient Medications  Medication Sig Dispense Refill  .  ALPRAZolam (XANAX) 0.5 MG tablet Take 1 tablet (0.5 mg total) by mouth as needed for anxiety (for sedation before MRI scan; take 1 hour before scan; may repeat 15 min before scan). 3 tablet 0  . cetirizine (ZYRTEC) 10 MG tablet Take 1 tablet (10 mg total) by mouth daily. 30 tablet 11  . gabapentin (NEURONTIN) 300 MG capsule TAKE ONE CAPSULE EVERY DAY 30 capsule 0  . ibuprofen (ADVIL,MOTRIN) 800 MG tablet Take 1 tablet (800 mg total) by mouth 3 (three) times daily. 21 tablet 0  . loratadine (CLARITIN) 10 MG tablet Take 10 mg by mouth daily.    . pantoprazole (PROTONIX) 20 MG tablet Take 1 tablet (20 mg total) by mouth daily. 30 tablet 3  . Cholecalciferol (VITAMIN D) 2000 UNITS CAPS Take 2,000 Units by mouth daily.    . ferrous sulfate 325 (65 FE) MG tablet Take 1 tablet (325 mg total) by mouth daily. (Patient not taking: Reported on 09/09/2017) 30 tablet 2  . triamcinolone (NASACORT) 55 MCG/ACT AERO nasal inhaler Place 2 sprays into the nose daily. (Patient not taking: Reported on 03/19/2017) 1 Inhaler 1  . Vitamin D, Ergocalciferol, (DRISDOL) 50000 units CAPS capsule Take 1 capsule (50,000 Units total) by mouth every 7 (seven) days. (Patient not taking: Reported on 09/09/2017) 15 capsule 0   No current facility-administered medications  for this visit.    Facility-Administered Medications Ordered in Other Visits  Medication Dose Route Frequency Provider Last Rate Last Dose  . gadopentetate dimeglumine (MAGNEVIST) injection 15 mL  15 mL Intravenous Once PRN Penumalli, Earlean Polka, MD        Family History  Problem Relation Age of Onset  . Hypertension Mother   . Cancer Mother        bladder  . Cancer Father        leaukemia  . Hypertension Maternal Aunt   . Diabetes Maternal Uncle   . Diabetes Maternal Grandmother   . Diabetes Paternal Grandmother     Social History   Socioeconomic History  . Marital status: Married    Spouse name: Not on file  . Number of children: 6  . Years of  education: 68  . Highest education level: Not on file  Occupational History    Comment: [employer]  Social Needs  . Financial resource strain: Not on file  . Food insecurity:    Worry: Not on file    Inability: Not on file  . Transportation needs:    Medical: Not on file    Non-medical: Not on file  Tobacco Use  . Smoking status: Never Smoker  . Smokeless tobacco: Never Used  Substance and Sexual Activity  . Alcohol use: Yes    Comment: RARE  . Drug use: No  . Sexual activity: Yes    Birth control/protection: Surgical  Lifestyle  . Physical activity:    Days per week: Not on file    Minutes per session: Not on file  . Stress: Not on file  Relationships  . Social connections:    Talks on phone: Not on file    Gets together: Not on file    Attends religious service: Not on file    Active member of club or organization: Not on file    Attends meetings of clubs or organizations: Not on file    Relationship status: Not on file  . Intimate partner violence:    Fear of current or ex partner: Not on file    Emotionally abused: Not on file    Physically abused: Not on file    Forced sexual activity: Not on file  Other Topics Concern  . Not on file  Social History Narrative   Lives with family   Mother died 06-28-2016 from cancer.   Caffeine - 2 x weekly     REVIEW OF SYSTEMS:   [X]  denotes positive finding, [ ]  denotes negative finding Cardiac  Comments:  Chest pain or chest pressure:    Shortness of breath upon exertion:    Short of breath when lying flat:    Irregular heart rhythm:        Vascular    Pain in calf, thigh, or hip brought on by ambulation: x   Pain in feet at night that wakes you up from your sleep:     Blood clot in your veins:    Leg swelling:  x       Pulmonary    Oxygen at home:    Productive cough:     Wheezing:         Neurologic    Sudden weakness in arms or legs:  x   Sudden numbness in arms or legs:     Sudden onset of  difficulty speaking or slurred speech:    Temporary loss of vision in one eye:  Problems with dizziness:         Gastrointestinal    Blood in stool:     Vomited blood:         Genitourinary    Burning when urinating:     Blood in urine:        Psychiatric    Major depression:         Hematologic    Bleeding problems:    Problems with blood clotting too easily:        Skin    Rashes or ulcers:        Constitutional    Fever or chills:      PHYSICAL EXAMINATION:  Vitals:   09/09/17 1238  BP: 137/84  Pulse: 96  Resp: 16  Temp: 98 F (36.7 C)  SpO2: 98%   Vitals:   09/09/17 1238  Weight: 170 lb (77.1 kg)  Height: 5' 2.5" (1.588 m)   Body mass index is 30.6 kg/m.  General:  WDWN in NAD; vital signs documented above Gait: Normal HENT: WNL, normocephalic Pulmonary: normal non-labored breathing , without Rales, rhonchi,  wheezing Cardiac: regular HR, without  Murmurs without carotid bruits Skin: without rashes Vascular Exam/Pulses:  Right Left  Radial 2+ (normal) 2+ (normal)  Ulnar 1+ (weak) 1+ (weak)  Popliteal Unable to palpate  Unable to palpate   DP 2+ (normal) 2+ (normal)  PT 1+ (weak) 1+ (weak)   Extremities: without ischemic changes, without Gangrene , without cellulitis; without open wounds; she does not have any swelling in her legs today Musculoskeletal: no muscle wasting or atrophy  Neurologic: A&O X 3;  No focal weakness or paresthesias are detected Psychiatric:  The pt has Normal affect.   Non-Invasive Vascular Imaging:   Lower venous reflux study 09/09/17: Right:  Abnormal reflux times noted in the CFV.  Evidence of chronic venous insufficiency is detected in the deep venous system.  There is not evidence of DVT in the lower extremity.  No evidence of superficial venous thrombosis.    Pt meds includes: Statin:  No. Beta Blocker:  No. Aspirin:  No. ACEI:  No. ARB:  No. CCB use:  No Other Antiplatelet/Anticoagulant:  No     ASSESSMENT/PLAN:: 37 y.o. female with right leg swelling and pain and hx of adenomyosis    -pt with intermittent right leg swelling and pain.  She does have evidence of chronic venous insufficiency by duplex in the deep system on the right.  Dr. Donnetta Hutching does not feel that the adenomyosis is causing her leg swelling and that would be unusual and would not need a hysterectomy on that basis and will defer that need to her gynecologist.   He has recommended compression stockings to wear when she gets up in the morning and take off at night. He has explained to her that this could be progressive and she would benefit from compression.  Leontine Locket, PA-C [facility] [phone]  Clinic MD:  Pt seen and examined with Dr. Donnetta Hutching  I have examined the patient, reviewed and agree with above.  Curt Jews, MD 09/09/2017 2:13 PM

## 2017-09-10 ENCOUNTER — Telehealth: Payer: Self-pay | Admitting: *Deleted

## 2017-09-10 NOTE — Telephone Encounter (Signed)
Left message on phone when call requested from patient due to "follow up questions".

## 2017-09-10 NOTE — Telephone Encounter (Signed)
Spoke to patient to answer her questions regarding treatment of swelling in lower extremities s/p appointment yesterday with Dr. Donnetta Hutching.

## 2017-09-15 ENCOUNTER — Telehealth: Payer: Self-pay | Admitting: *Deleted

## 2017-09-15 NOTE — Telephone Encounter (Signed)
Call from patient requesting an appointment with Dr. Donnetta Hutching. States she has many questions regarding her condition/diagnosis and fells she did not understand everything that she was told. I had spoke with her last week. Explained that this is a chronic non-surgical condition. She feels that she would benefit from appointment. Date given.

## 2017-09-24 DIAGNOSIS — F329 Major depressive disorder, single episode, unspecified: Secondary | ICD-10-CM | POA: Diagnosis not present

## 2017-09-24 DIAGNOSIS — M5441 Lumbago with sciatica, right side: Secondary | ICD-10-CM | POA: Diagnosis not present

## 2017-10-28 ENCOUNTER — Ambulatory Visit: Payer: BLUE CROSS/BLUE SHIELD | Admitting: Vascular Surgery

## 2017-10-31 DIAGNOSIS — N760 Acute vaginitis: Secondary | ICD-10-CM | POA: Diagnosis not present

## 2017-10-31 DIAGNOSIS — Z113 Encounter for screening for infections with a predominantly sexual mode of transmission: Secondary | ICD-10-CM | POA: Diagnosis not present

## 2017-11-28 DIAGNOSIS — F329 Major depressive disorder, single episode, unspecified: Secondary | ICD-10-CM | POA: Diagnosis not present

## 2017-11-28 DIAGNOSIS — I10 Essential (primary) hypertension: Secondary | ICD-10-CM | POA: Diagnosis not present

## 2017-11-28 DIAGNOSIS — Z0000 Encounter for general adult medical examination without abnormal findings: Secondary | ICD-10-CM | POA: Diagnosis not present

## 2017-11-28 DIAGNOSIS — M5441 Lumbago with sciatica, right side: Secondary | ICD-10-CM | POA: Diagnosis not present

## 2017-12-01 DIAGNOSIS — R739 Hyperglycemia, unspecified: Secondary | ICD-10-CM | POA: Diagnosis not present

## 2017-12-01 DIAGNOSIS — I10 Essential (primary) hypertension: Secondary | ICD-10-CM | POA: Diagnosis not present

## 2017-12-17 DIAGNOSIS — N941 Unspecified dyspareunia: Secondary | ICD-10-CM | POA: Diagnosis not present

## 2017-12-17 DIAGNOSIS — N760 Acute vaginitis: Secondary | ICD-10-CM | POA: Diagnosis not present

## 2018-02-26 DIAGNOSIS — L729 Follicular cyst of the skin and subcutaneous tissue, unspecified: Secondary | ICD-10-CM | POA: Diagnosis not present

## 2018-06-19 ENCOUNTER — Emergency Department (HOSPITAL_BASED_OUTPATIENT_CLINIC_OR_DEPARTMENT_OTHER): Payer: BLUE CROSS/BLUE SHIELD

## 2018-06-19 ENCOUNTER — Other Ambulatory Visit: Payer: Self-pay

## 2018-06-19 ENCOUNTER — Emergency Department (HOSPITAL_BASED_OUTPATIENT_CLINIC_OR_DEPARTMENT_OTHER)
Admission: EM | Admit: 2018-06-19 | Discharge: 2018-06-19 | Disposition: A | Discharge status: Home / Self Care | Payer: BLUE CROSS/BLUE SHIELD | Attending: Emergency Medicine | Admitting: Emergency Medicine

## 2018-06-19 ENCOUNTER — Encounter (HOSPITAL_BASED_OUTPATIENT_CLINIC_OR_DEPARTMENT_OTHER): Payer: Self-pay | Admitting: Emergency Medicine

## 2018-06-19 DIAGNOSIS — R102 Pelvic and perineal pain: Secondary | ICD-10-CM | POA: Insufficient documentation

## 2018-06-19 DIAGNOSIS — R110 Nausea: Secondary | ICD-10-CM | POA: Insufficient documentation

## 2018-06-19 DIAGNOSIS — D259 Leiomyoma of uterus, unspecified: Secondary | ICD-10-CM | POA: Diagnosis not present

## 2018-06-19 DIAGNOSIS — Z79899 Other long term (current) drug therapy: Secondary | ICD-10-CM | POA: Diagnosis not present

## 2018-06-19 LAB — URINALYSIS, ROUTINE W REFLEX MICROSCOPIC
Bilirubin Urine: NEGATIVE
Glucose, UA: NEGATIVE mg/dL
Hgb urine dipstick: NEGATIVE
Ketones, ur: NEGATIVE mg/dL
Nitrite: NEGATIVE
Protein, ur: NEGATIVE mg/dL
Specific Gravity, Urine: <1.005 — ABNORMAL LOW (ref 1.005–1.030)
pH: 6.5 (ref 5.0–8.0)

## 2018-06-19 LAB — COMPREHENSIVE METABOLIC PANEL
ALBUMIN: 4 g/dL (ref 3.5–5.0)
ALK PHOS: 55 U/L (ref 38–126)
ALT: 16 U/L (ref 0–44)
AST: 19 U/L (ref 15–41)
Anion gap: 6 (ref 5–15)
BUN: 11 mg/dL (ref 6–20)
CALCIUM: 9.5 mg/dL (ref 8.9–10.3)
CO2: 25 mmol/L (ref 22–32)
CREATININE: 0.86 mg/dL (ref 0.44–1.00)
Chloride: 104 mmol/L (ref 98–111)
GFR calc non Af Amer: >60 mL/min (ref >60)
Glucose, Bld: 88 mg/dL (ref 70–99)
Potassium: 3.6 mmol/L (ref 3.5–5.1)
SODIUM: 135 mmol/L (ref 135–145)
Total Bilirubin: 0.5 mg/dL (ref 0.3–1.2)
Total Protein: 7.7 g/dL (ref 6.5–8.1)

## 2018-06-19 LAB — CBC WITH DIFFERENTIAL/PLATELET
ABS IMMATURE GRANULOCYTES: 0.02 10*3/uL (ref 0.00–0.07)
Basophils Absolute: 0 10*3/uL (ref 0.0–0.1)
Basophils Relative: 0 %
Eosinophils Absolute: 0.1 10*3/uL (ref 0.0–0.5)
Eosinophils Relative: 1 %
HCT: 36.9 % (ref 36.0–46.0)
HEMOGLOBIN: 11.3 g/dL — AB (ref 12.0–15.0)
Immature Granulocytes: 0 %
Lymphocytes Relative: 33 %
Lymphs Abs: 2.6 10*3/uL (ref 0.7–4.0)
MCH: 25.1 pg — AB (ref 26.0–34.0)
MCHC: 30.6 g/dL (ref 30.0–36.0)
MCV: 81.8 fL (ref 80.0–100.0)
MONO ABS: 0.5 10*3/uL (ref 0.1–1.0)
MONOS PCT: 6 %
NEUTROS ABS: 4.5 10*3/uL (ref 1.7–7.7)
Neutrophils Relative %: 60 %
PLATELETS: 365 10*3/uL (ref 150–400)
RBC: 4.51 MIL/uL (ref 3.87–5.11)
RDW: 15.9 % — ABNORMAL HIGH (ref 11.5–15.5)
WBC: 7.7 10*3/uL (ref 4.0–10.5)
nRBC: 0 % (ref 0.0–0.2)

## 2018-06-19 LAB — PREGNANCY, URINE: Preg Test, Ur: NEGATIVE

## 2018-06-19 LAB — URINALYSIS, MICROSCOPIC (REFLEX)

## 2018-06-19 MED ORDER — NAPROXEN 500 MG PO TABS: 500.0000 mg | ORAL_TABLET | Freq: Two times a day (BID) | ORAL | 0 refills | Status: DC

## 2018-06-19 NOTE — ED Notes (Signed)
C/o lower abd pain onset Sunday w increased pain after urination,  denies n/v/d, no vag dc, no burning w urination

## 2018-06-19 NOTE — ED Provider Notes (Signed)
[facility]PART[facility] Provider Note   CSN: [id number] Arrival date & time: 06/19/18  1236     History   Chief Complaint Chief Complaint  Patient presents with  . Abdominal Pain    HPI Jenna Mcclure is a 38 y.o. female.  Patient presents with suprapubic pelvic pain ongoing over the past 4 to 5 days.  Pain is progressively become a little bit worse.  She has had nausea but no vomiting.  She denies any vaginal bleeding or discharge.  She is sexually active with her husband.  She denies any diarrhea.  Pain is cramping and worse after voiding.  No treatments prior to arrival.  She does not have any concerns over sexually transmitted infection.  Onset of symptoms acute.  Course is intermittent.     Past Medical History:  Diagnosis Date  . Allergic rhinitis   . Borderline hypertension   . Breastfeeding (infant)   . GERD (gastroesophageal reflux disease)   . Heart murmur    MILD -  ASYMPTOMATIC  . History of abnormal cervical Pap smear   . History of ovarian cyst   . TMJ (dislocation of temporomandibular joint)   . Wears glasses     Patient Active Problem List   Diagnosis Date Noted  . Nerve pain 12/06/2016  . Neck swelling 12/06/2016  . Grief 10/09/2016  . Major depression 03/08/2016  . Vitamin D deficiency 11/10/2015  . 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 Sebaceous cyst of right axilla 09/16/2015  . Other social stressor 11/26/2012  . GERD (gastroesophageal reflux disease) 03/19/2011  . Chronic pain syndrome 02/12/2011    Past Surgical History:  Procedure Laterality Date  . DIAGNOSTIC LAPAROSCOPY  2009  . LAPAROSCOPIC TUBAL LIGATION Bilateral 02/15/2014   Procedure: LAPAROSCOPIC TUBAL LIGATION WITH FILSHIE CLIPS;  Surgeon: Margarette Asal, MD;  Location: [facility];  Service: Gynecology;  Laterality: Bilateral;     OB History    Gravida  6   Para  6   Term  6   Preterm  0   AB  0   Living  6     SAB  0   TAB  0     Ectopic  0   Multiple  0   Live Births  6            Home Medications    Prior to Admission medications   Medication Sig Start Date End Date Taking? Authorizing Provider  loratadine (CLARITIN) 10 MG tablet Take 10 mg by mouth daily.   Yes [provider]  pantoprazole (PROTONIX) 20 MG tablet Take 1 tablet (20 mg total) by mouth daily. 09/16/16  Yes Katheren Shams, DO  Vitamin D, Ergocalciferol, (DRISDOL) 50000 units CAPS capsule Take 1 capsule (50,000 Units total) by mouth every 7 (seven) days. 12/09/16  Yes Rory Percy, DO  ALPRAZolam Duanne Moron) 0.5 MG tablet Take 1 tablet (0.5 mg total) by mouth as needed for anxiety (for sedation before MRI scan; take 1 hour before scan; may repeat 15 min before scan). 03/24/17   Penumalli, Earlean Polka, MD  cetirizine (ZYRTEC) 10 MG tablet Take 1 tablet (10 mg total) by mouth daily. 07/12/15   Ivar Drape D, PA  Cholecalciferol (VITAMIN D) 2000 UNITS CAPS Take 2,000 Units by mouth daily.    [provider]  ferrous sulfate 325 (65 FE) MG tablet Take 1 tablet (325 mg total) by mouth daily. Patient not taking: Reported on 09/09/2017 01/14/17  Rogue Bussing, MD  gabapentin (NEURONTIN) 300 MG capsule TAKE ONE CAPSULE EVERY DAY 06/30/17   Rory Percy, DO  ibuprofen (ADVIL,MOTRIN) 800 MG tablet Take 1 tablet (800 mg total) by mouth 3 (three) times daily. 02/17/17   McDonald, Mia A, PA-C  triamcinolone (NASACORT) 55 MCG/ACT AERO nasal inhaler Place 2 sprays into the nose daily. Patient not taking: Reported on 03/19/2017 07/12/15   Ivar Drape D, PA  Norgestim-Eth Radene Journey Triphasic (ORTHO TRI-CYCLEN LO PO) Take 1 tablet by mouth daily.  08/20/11  [provider]    Family History Family History  Problem Relation Age of Onset  . Hypertension Mother   . Cancer Mother        bladder  . Cancer Father        leaukemia  . Hypertension Maternal Aunt   . Diabetes Maternal Uncle   . Diabetes Maternal Grandmother    . Diabetes Paternal Grandmother     Social History Social History   Tobacco Use  . Smoking status: Never Smoker  . Smokeless tobacco: Never Used  Substance Use Topics  . Alcohol use: Yes    Comment: RARE  . Drug use: No     Allergies   Miconazole nitrate   Review of Systems Review of Systems  Constitutional: Negative for fever.  HENT: Negative for rhinorrhea and sore throat.   Eyes: Negative for redness.  Respiratory: Negative for cough.   Cardiovascular: Negative for chest pain.  Gastrointestinal: Positive for nausea. Negative for abdominal pain, diarrhea and vomiting.  Genitourinary: Positive for pelvic pain. Negative for dysuria, flank pain, frequency, vaginal bleeding and vaginal discharge.  Musculoskeletal: Negative for myalgias.  Skin: Negative for rash.  Neurological: Negative for headaches.     Physical Exam Updated Vital Signs BP (!) 134/91 (BP Location: Left Arm)   Pulse 72   Temp 98.3 F (36.8 C) (Oral)   Resp 16   Ht 5\' 2"  (1.575 m)   Wt 78 kg   LMP 06/11/2018   SpO2 100%   BMI 31.46 kg/m   Physical Exam Vitals signs and nursing note reviewed.  Constitutional:      Appearance: She is well-developed.  HENT:     Head: Normocephalic and atraumatic.  Eyes:     General:        Right eye: No discharge.        Left eye: No discharge.     Conjunctiva/sclera: Conjunctivae normal.  Neck:     Musculoskeletal: Normal range of motion and neck supple.  Cardiovascular:     Rate and Rhythm: Normal rate and regular rhythm.     Heart sounds: Normal heart sounds.  Pulmonary:     Effort: Pulmonary effort is normal.     Breath sounds: Normal breath sounds.  Abdominal:     Palpations: Abdomen is soft.     Tenderness: There is abdominal tenderness (Minimal) in the suprapubic area. There is no guarding or rebound. Negative signs include Murphy's sign and Rovsing's sign.  Skin:    General: Skin is warm and dry.  Neurological:     Mental Status: She is  alert.      ED Treatments / Results  Labs (all labs ordered are listed, but only abnormal results are displayed) Labs Reviewed  URINALYSIS, ROUTINE W REFLEX MICROSCOPIC - Abnormal; Notable for the following components:      Result Value   Color, Urine STRAW (*)    Specific Gravity, Urine <1.005 (*)    Leukocytes, UA  SMALL (*)    All other components within normal limits  CBC WITH DIFFERENTIAL/PLATELET - Abnormal; Notable for the following components:   Hemoglobin 11.3 (*)    MCH 25.1 (*)    RDW 15.9 (*)    All other components within normal limits  URINALYSIS, MICROSCOPIC (REFLEX) - Abnormal; Notable for the following components:   Bacteria, UA FEW (*)    All other components within normal limits  PREGNANCY, URINE  COMPREHENSIVE METABOLIC PANEL    EKG None  Radiology US Transvaginal Non-ob  Result Date: 06/19/2018 CLINICAL DATA:  38 year old female with pelvic pain for 5 days. Intermittent right lower quadrant pain. LMP 06/11/2017. EXAM: TRANSABDOMINAL AND TRANSVAGINAL ULTRASOUND OF PELVIS DOPPLER ULTRASOUND OF OVARIES TECHNIQUE: Both transabdominal and transvaginal ultrasound examinations of the pelvis were performed. Transabdominal technique was performed for global imaging of the pelvis including uterus, ovaries, adnexal regions, and pelvic cul-de-sac. It was necessary to proceed with endovaginal exam following the transabdominal exam to visualize the ovaries. Color and duplex Doppler ultrasound was utilized to evaluate blood flow to the ovaries. COMPARISON:  Lumbar MRI 08/26/2017. CT Abdomen and Pelvis 05/21/2015, and earlier. FINDINGS: Uterus Measurements: 10.0 x 5.6 x 6.5 centimeters = volume: 187 mL. Suggestion of a small round hypoechoic fibroid at the right posterior body near the fundus measuring 12 millimeters. Elsewhere the myometrium appears within normal limits. Endometrium Thickness: 6 millimeters.  No focal abnormality visualized. Right ovary Measurements: 4.3 x 2.4 x  2.7 centimeters = volume: 14 mL. Multiple small cysts/follicles (image 81). Normal appearance/no adnexal mass. Left ovary Measurements: 3.4 x 1.5 x 3.1 centimeters = volume: 8 mL. Multiple small cysts and follicles similar to the right side (image 68). Normal appearance/no adnexal mass. Pulsed Doppler evaluation of both ovaries demonstrates normal low-resistance arterial and venous waveforms. Other findings No pelvic free fluid. IMPRESSION: 1. Negative for ovarian mass or torsion. Numerous small follicles noted in both ovaries. 2. Small 12 millimeter fibroid suspected in the posterior body. Uterus and endometrium otherwise within normal limits. Electronically Signed   By: Genevie Ann M.D.   On: 06/19/2018 15:15   US Pelvis Complete  Result Date: 06/19/2018 CLINICAL DATA:  38 year old female with pelvic pain for 5 days. Intermittent right lower quadrant pain. LMP 06/11/2017. EXAM: TRANSABDOMINAL AND TRANSVAGINAL ULTRASOUND OF PELVIS DOPPLER ULTRASOUND OF OVARIES TECHNIQUE: Both transabdominal and transvaginal ultrasound examinations of the pelvis were performed. Transabdominal technique was performed for global imaging of the pelvis including uterus, ovaries, adnexal regions, and pelvic cul-de-sac. It was necessary to proceed with endovaginal exam following the transabdominal exam to visualize the ovaries. Color and duplex Doppler ultrasound was utilized to evaluate blood flow to the ovaries. COMPARISON:  Lumbar MRI 08/26/2017. CT Abdomen and Pelvis 05/21/2015, and earlier. FINDINGS: Uterus Measurements: 10.0 x 5.6 x 6.5 centimeters = volume: 187 mL. Suggestion of a small round hypoechoic fibroid at the right posterior body near the fundus measuring 12 millimeters. Elsewhere the myometrium appears within normal limits. Endometrium Thickness: 6 millimeters.  No focal abnormality visualized. Right ovary Measurements: 4.3 x 2.4 x 2.7 centimeters = volume: 14 mL. Multiple small cysts/follicles (image 81). Normal  appearance/no adnexal mass. Left ovary Measurements: 3.4 x 1.5 x 3.1 centimeters = volume: 8 mL. Multiple small cysts and follicles similar to the right side (image 68). Normal appearance/no adnexal mass. Pulsed Doppler evaluation of both ovaries demonstrates normal low-resistance arterial and venous waveforms. Other findings No pelvic free fluid. IMPRESSION: 1. Negative for ovarian mass or torsion. Numerous small follicles noted  in both ovaries. 2. Small 12 millimeter fibroid suspected in the posterior body. Uterus and endometrium otherwise within normal limits. Electronically Signed   By: Genevie Ann M.D.   On: 06/19/2018 15:15   [place] Art/ven Flow Abd Pelv Doppler  Result Date: 06/19/2018 CLINICAL DATA:  38 year old female with pelvic pain for 5 days. Intermittent right lower quadrant pain. LMP 06/11/2017. EXAM: TRANSABDOMINAL AND TRANSVAGINAL ULTRASOUND OF PELVIS DOPPLER ULTRASOUND OF OVARIES TECHNIQUE: Both transabdominal and transvaginal ultrasound examinations of the pelvis were performed. Transabdominal technique was performed for global imaging of the pelvis including uterus, ovaries, adnexal regions, and pelvic cul-de-sac. It was necessary to proceed with endovaginal exam following the transabdominal exam to visualize the ovaries. Color and duplex Doppler ultrasound was utilized to evaluate blood flow to the ovaries. COMPARISON:  Lumbar MRI 08/26/2017. CT Abdomen and Pelvis 05/21/2015, and earlier. FINDINGS: Uterus Measurements: 10.0 x 5.6 x 6.5 centimeters = volume: 187 mL. Suggestion of a small round hypoechoic fibroid at the right posterior body near the fundus measuring 12 millimeters. Elsewhere the myometrium appears within normal limits. Endometrium Thickness: 6 millimeters.  No focal abnormality visualized. Right ovary Measurements: 4.3 x 2.4 x 2.7 centimeters = volume: 14 mL. Multiple small cysts/follicles (image 81). Normal appearance/no adnexal mass. Left ovary Measurements: 3.4 x 1.5 x 3.1  centimeters = volume: 8 mL. Multiple small cysts and follicles similar to the right side (image 68). Normal appearance/no adnexal mass. Pulsed Doppler evaluation of both ovaries demonstrates normal low-resistance arterial and venous waveforms. Other findings No pelvic free fluid. IMPRESSION: 1. Negative for ovarian mass or torsion. Numerous small follicles noted in both ovaries. 2. Small 12 millimeter fibroid suspected in the posterior body. Uterus and endometrium otherwise within normal limits. Electronically Signed   By: Genevie Ann M.D.   On: 06/19/2018 15:15    Procedures Procedures (including critical care time)  Medications Ordered in ED Medications - No data to display   Initial Impression / Assessment and Plan / ED Course  I have reviewed the triage vital signs and the nursing notes.  Pertinent labs & imaging results that were available during my care of the patient were reviewed by me and considered in my medical decision making (see chart for details).     Patient seen and examined. Work-up initiated. Will obtain US. Offered pelvic exam, discussed risks and benefits.  She defers stating that she recently had a "Pap smear".  I feel that this is okay she is not having any vaginal discharge complaints at this time. Low concern for PID.   Vital signs reviewed and are as follows: BP (!) 134/91 (BP Location: Left Arm)   Pulse 72   Temp 98.3 F (36.8 C) (Oral)   Resp 16   Ht 5\' 2"  (1.575 m)   Wt 78 kg   LMP 06/11/2018   SpO2 100%   BMI 31.46 kg/m   3:40 PM patient updated on ultrasound results.  She appears well.  Will continue pain control with naproxen and Tylenol.  She has a gynecologist with whom she can follow-up.  The patient was urged to return to the Emergency Department immediately with worsening of current symptoms, worsening abdominal pain, persistent vomiting, blood noted in stools, fever, or any other concerns. The patient verbalized understanding.    Final Clinical  Impressions(s) / ED Diagnoses   Final diagnoses:  Pelvic pain   Patient with several days of pelvic pain, no discharge or bleeding.  Patient is not pregnant.  Ultrasound demonstrates a  small fibroid and bilateral multiple ovarian cysts.  No evidence of torsion.  Patient defers pelvic exam.  She is low risk for PID given history and exam.  Minimal tenderness on exam.  She has OB/GYN follow-up.   ED Discharge Orders         Ordered    naproxen (NAPROSYN) 500 MG tablet  2 times daily     06/19/18 1537           Carlisle Cater, [place] 06/19/18 1542    Hayden Rasmussen, MD 06/19/18 1729

## 2018-06-19 NOTE — ED Triage Notes (Signed)
Pain since Monday to RLQ. States just finished her menstrual period. Dysuria

## 2018-06-19 NOTE — Discharge Instructions (Signed)
Please read and follow all provided instructions.  Your diagnoses today include:  1. Pelvic pain     Tests performed today include:  Blood counts and electrolytes  Blood tests to check liver and kidney function  Urine test to look for infection and pregnancy (in women)  Ultrasound showing small fibroid and multiple ovarian cysts on both sides  Vital signs. See below for your results today.   Medications prescribed:   Naproxen - anti-inflammatory pain medication  Do not exceed 500mg  naproxen every 12 hours, take with food  You have been prescribed an anti-inflammatory medication or NSAID. Take with food. Take smallest effective dose for the shortest duration needed for your pain. Stop taking if you experience stomach pain or vomiting.   Take any prescribed medications only as directed.  Home care instructions:   Follow any educational materials contained in this packet.  Follow-up instructions: Please follow-up with your gynecologist in the next 1 week for further evaluation of your symptoms.  Return instructions:  SEEK IMMEDIATE MEDICAL ATTENTION IF:  The pain does not go away or becomes severe   A temperature above 101F develops   Repeated vomiting occurs (multiple episodes)   The pain becomes localized to portions of the abdomen. The right side could possibly be appendicitis. In an adult, the left lower portion of the abdomen could be colitis or diverticulitis.   Blood is being passed in stools or vomit (bright red or black tarry stools)   You develop chest pain, difficulty breathing, dizziness or fainting, or become confused, poorly responsive, or inconsolable (young children)  If you have any other emergent concerns regarding your health  Additional Information: Abdominal (belly) pain can be caused by many things. Your caregiver performed an examination and possibly ordered blood/urine tests and imaging (CT scan, x-rays, ultrasound). Many cases can be observed  and treated at home after initial evaluation in the emergency department. Even though you are being discharged home, abdominal pain can be unpredictable. Therefore, you need a repeated exam if your pain does not resolve, returns, or worsens. Most patients with abdominal pain don't have to be admitted to the hospital or have surgery, but serious problems like appendicitis and gallbladder attacks can start out as nonspecific pain. Many abdominal conditions cannot be diagnosed in one visit, so follow-up evaluations are very important.  Your vital signs today were: BP (!) 134/91 (BP Location: Left Arm)    Pulse 72    Temp 98.3 F (36.8 C) (Oral)    Resp 16    Ht 5\' 2"  (1.575 m)    Wt 78 kg    LMP 06/11/2018    SpO2 100%    BMI 31.46 kg/m  If your blood pressure (bp) was elevated above 135/85 this visit, please have this repeated by your doctor within one month. --------------

## 2018-07-16 DIAGNOSIS — Z6831 Body mass index (BMI) 31.0-31.9, adult: Secondary | ICD-10-CM | POA: Diagnosis not present

## 2018-07-16 DIAGNOSIS — Z01419 Encounter for gynecological examination (general) (routine) without abnormal findings: Secondary | ICD-10-CM | POA: Diagnosis not present

## 2018-10-12 DIAGNOSIS — I10 Essential (primary) hypertension: Secondary | ICD-10-CM | POA: Diagnosis not present

## 2018-10-12 DIAGNOSIS — K219 Gastro-esophageal reflux disease without esophagitis: Secondary | ICD-10-CM | POA: Diagnosis not present

## 2018-10-12 DIAGNOSIS — F329 Major depressive disorder, single episode, unspecified: Secondary | ICD-10-CM | POA: Diagnosis not present

## 2019-01-08 DIAGNOSIS — N898 Other specified noninflammatory disorders of vagina: Secondary | ICD-10-CM | POA: Diagnosis not present

## 2019-01-11 DIAGNOSIS — N898 Other specified noninflammatory disorders of vagina: Secondary | ICD-10-CM | POA: Diagnosis not present

## 2019-04-01 DIAGNOSIS — Z20828 Contact with and (suspected) exposure to other viral communicable diseases: Secondary | ICD-10-CM | POA: Diagnosis not present

## 2019-04-14 DIAGNOSIS — L723 Sebaceous cyst: Secondary | ICD-10-CM | POA: Diagnosis not present

## 2019-04-14 DIAGNOSIS — K219 Gastro-esophageal reflux disease without esophagitis: Secondary | ICD-10-CM | POA: Diagnosis not present

## 2019-04-14 DIAGNOSIS — F329 Major depressive disorder, single episode, unspecified: Secondary | ICD-10-CM | POA: Diagnosis not present

## 2019-04-14 DIAGNOSIS — R490 Dysphonia: Secondary | ICD-10-CM | POA: Diagnosis not present

## 2019-04-16 ENCOUNTER — Other Ambulatory Visit: Payer: Self-pay | Admitting: Family Medicine

## 2019-04-16 DIAGNOSIS — L723 Sebaceous cyst: Secondary | ICD-10-CM

## 2019-04-27 ENCOUNTER — Other Ambulatory Visit: Payer: Self-pay

## 2019-04-27 ENCOUNTER — Ambulatory Visit
Admission: RE | Admit: 2019-04-27 | Discharge: 2019-04-27 | Disposition: A | Discharge status: Home / Self Care | Payer: BC Managed Care – PPO | Source: Ambulatory Visit | Attending: Family Medicine | Admitting: Family Medicine

## 2019-04-27 ENCOUNTER — Ambulatory Visit
Admission: RE | Admit: 2019-04-27 | Discharge: 2019-04-27 | Disposition: A | Discharge status: Home / Self Care | Payer: BLUE CROSS/BLUE SHIELD | Source: Ambulatory Visit | Attending: Family Medicine | Admitting: Family Medicine

## 2019-04-27 DIAGNOSIS — L723 Sebaceous cyst: Secondary | ICD-10-CM | POA: Diagnosis not present

## 2019-05-25 ENCOUNTER — Ambulatory Visit: Payer: BC Managed Care – PPO | Attending: Internal Medicine

## 2019-05-25 DIAGNOSIS — Z20828 Contact with and (suspected) exposure to other viral communicable diseases: Secondary | ICD-10-CM | POA: Diagnosis not present

## 2019-05-25 DIAGNOSIS — Z20822 Contact with and (suspected) exposure to covid-19: Secondary | ICD-10-CM

## 2019-05-27 LAB — NOVEL CORONAVIRUS, NAA: SARS-CoV-2, NAA: NOT DETECTED

## 2019-06-23 ENCOUNTER — Other Ambulatory Visit: Payer: BC Managed Care – PPO

## 2019-10-10 IMAGING — US US ART/VEN ABD/PELV/SCROTUM DOPPLER LTD
1 series · 13 of 25 positions shown · non-contrast
Comparison: Lumbar MRI 08/26/2017. CT Abdomen and Pelvis
05/21/2015, and earlier.

CLINICAL DATA: 37-year-old female with pelvic pain for 5 days.
Intermittent right lower quadrant pain. LMP 06/11/2017.

EXAM:
TRANSABDOMINAL AND TRANSVAGINAL ULTRASOUND OF PELVIS
DOPPLER ULTRASOUND OF OVARIES
TECHNIQUE: Both transabdominal and transvaginal ultrasound examinations of the
pelvis were performed. Transabdominal technique was performed for
global imaging of the pelvis including uterus, ovaries, adnexal
regions, and pelvic cul-de-sac.
It was necessary to proceed with endovaginal exam following the
transabdominal exam to visualize the ovaries. Color and duplex
Doppler ultrasound was utilized to evaluate blood flow to the
ovaries.

[Series 1: us art/ven abd/pelv/scrotum doppler ltd · 0.24mm/px · 13 of 90 slices shown]
[im 1/90]
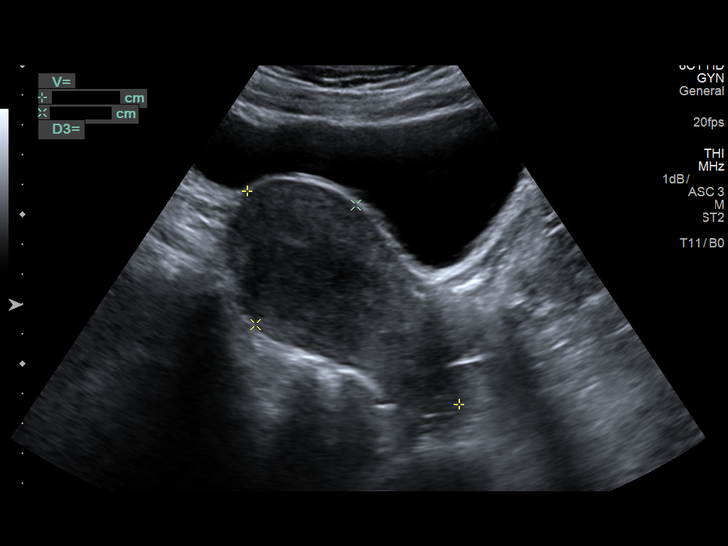
[im 8/90]
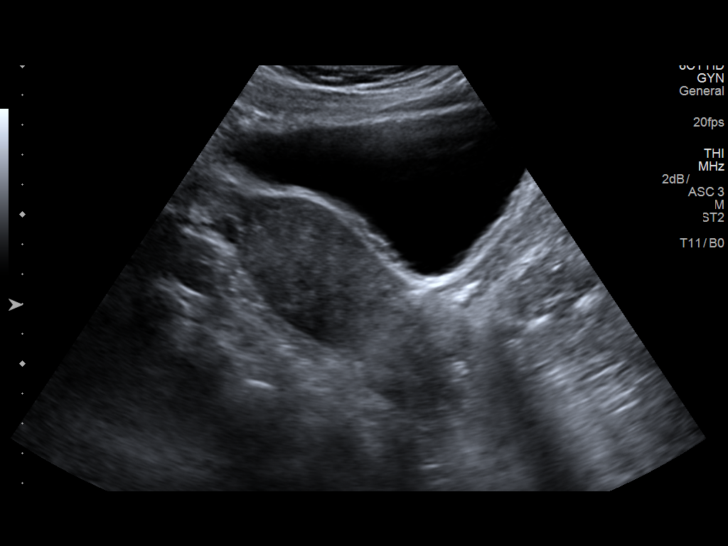
[im 15/90]
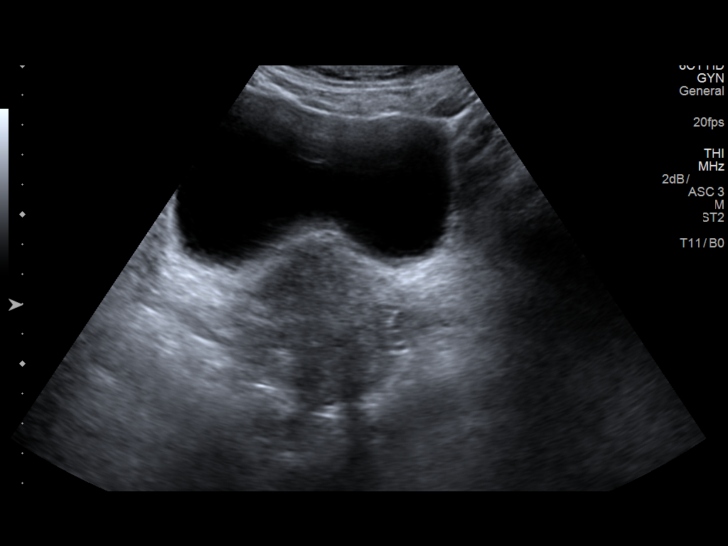
[im 23/90]
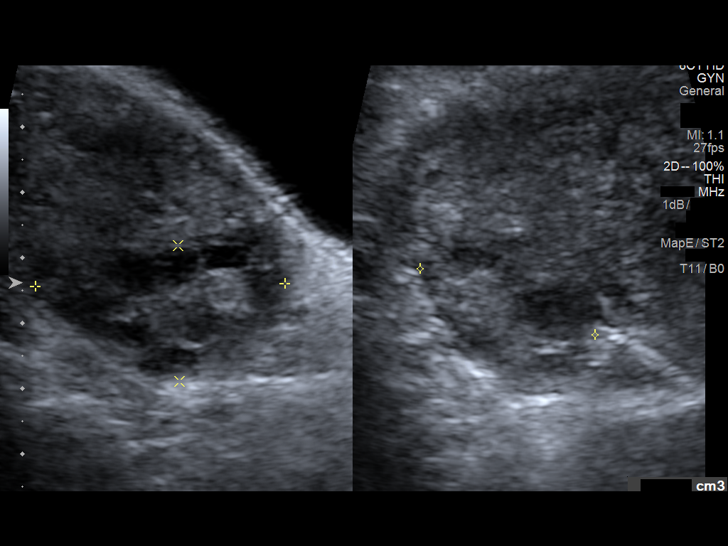
[im 30/90]
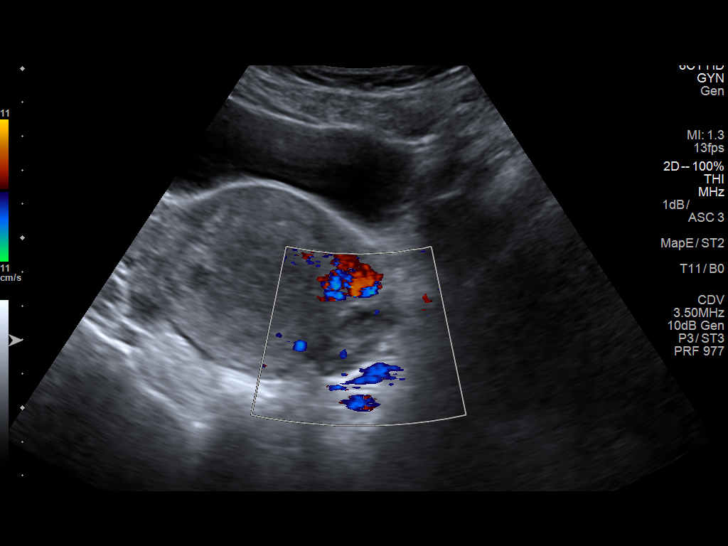
[im 38/90]
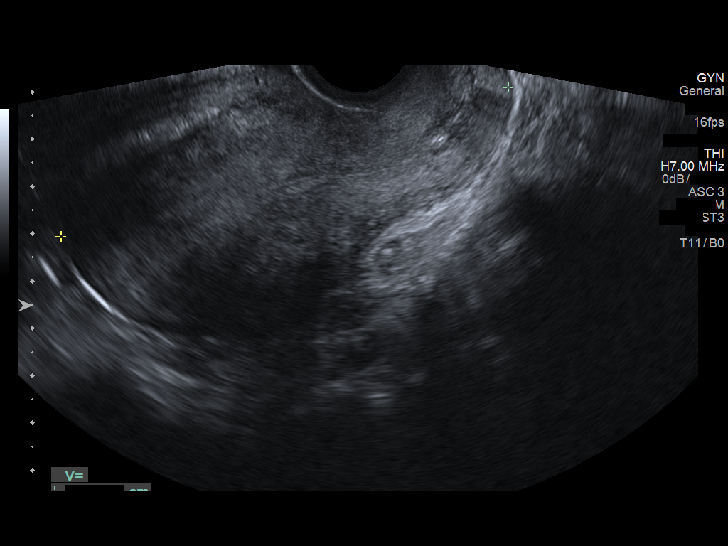
[im 45/90]
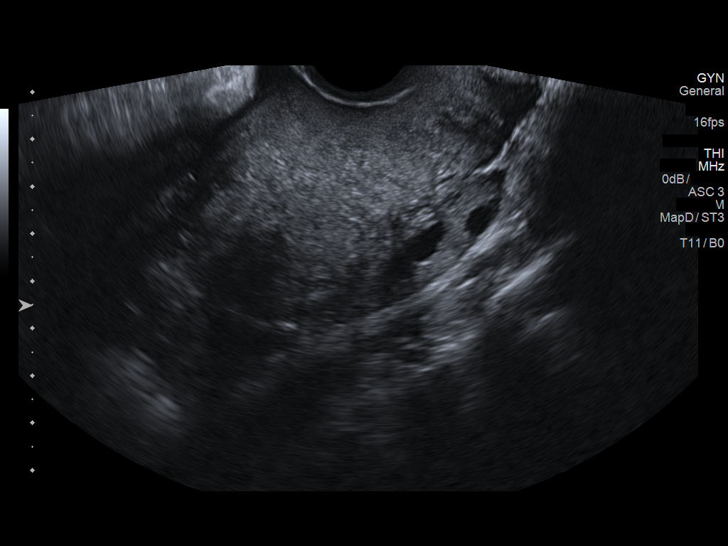
[im 52/90]
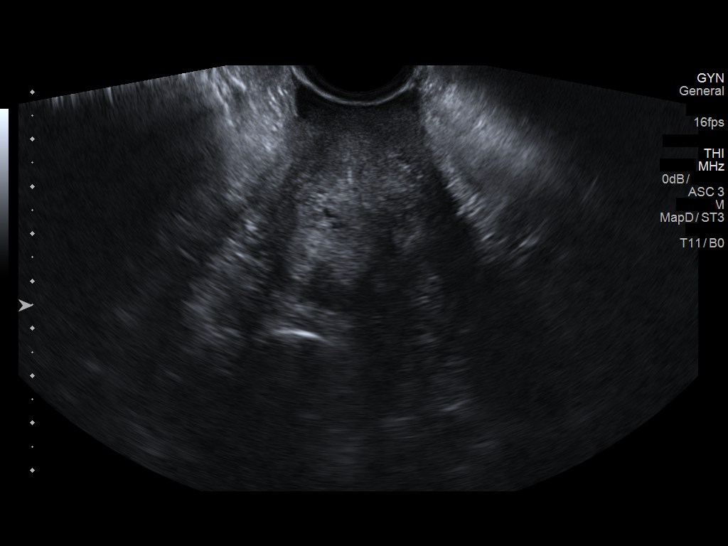
[im 60/90]
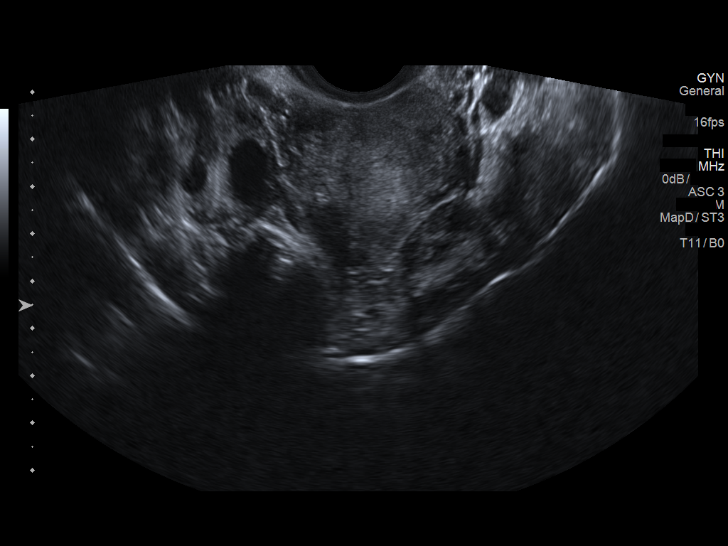
[im 67/90]
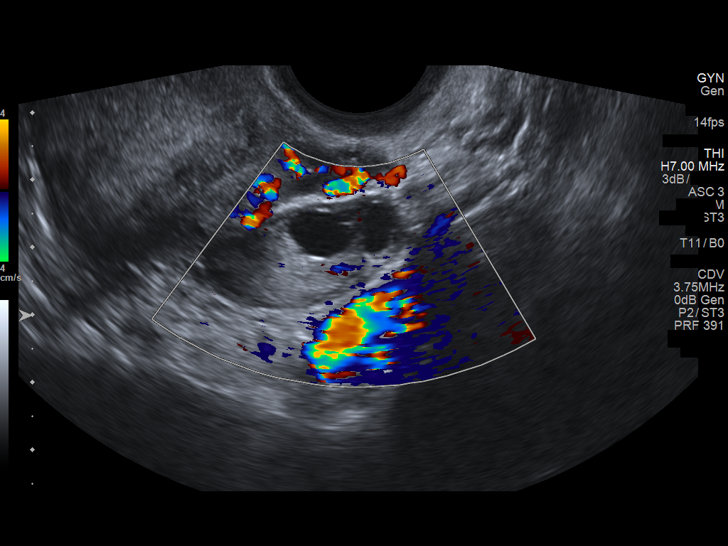
[im 75/90]
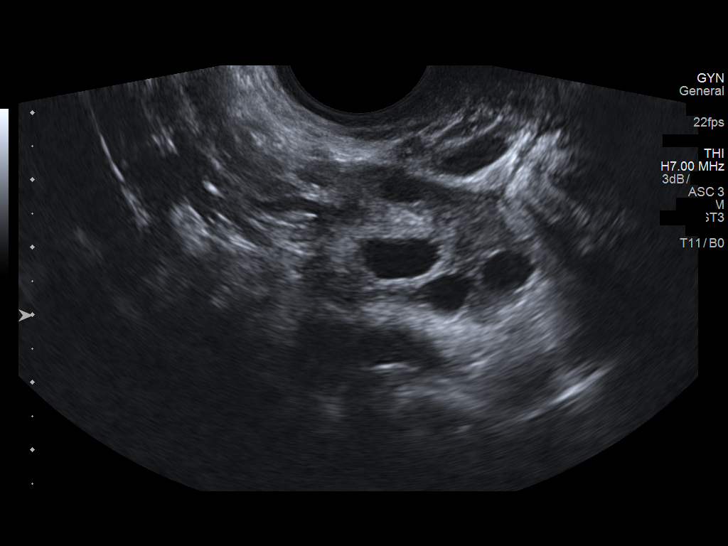
[im 82/90]
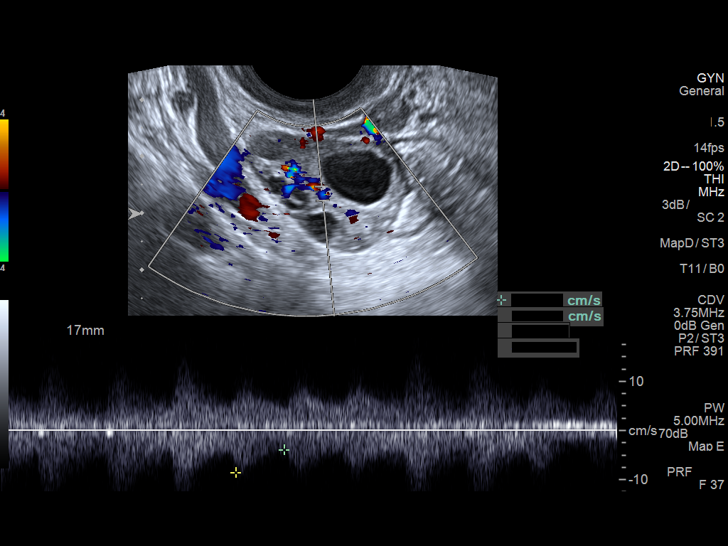
[im 90/90]
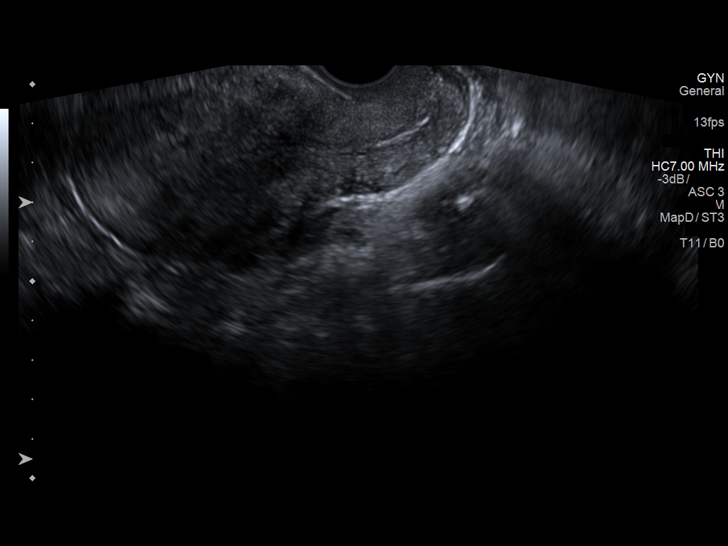

[13 of 25 positions shown; findings below may reference images not displayed]

FINDINGS: Uterus

Measurements: 10.0 x 5.6 x 6.5 centimeters = volume: 187 mL.
Suggestion of a small round hypoechoic fibroid at the right
posterior body near the fundus measuring 12 millimeters. Elsewhere
the myometrium appears within normal limits.

Endometrium

Thickness: 6 millimeters.  No focal abnormality visualized.

Right ovary

Measurements: 4.3 x 2.4 x 2.7 centimeters = volume: 14 mL. Multiple
small cysts/follicles (image 81). Normal appearance/no adnexal mass.

Left ovary

Measurements: 3.4 x 1.5 x 3.1 centimeters = volume: 8 mL. Multiple
small cysts and follicles similar to the right side (image 68).
Normal appearance/no adnexal mass.

Pulsed Doppler evaluation of both ovaries demonstrates normal
low-resistance arterial and venous waveforms.

Other findings

No pelvic free fluid.
IMPRESSION: 1. Negative for ovarian mass or torsion. Numerous small follicles
noted in both ovaries.
2. Small 12 millimeter fibroid suspected in the posterior body.
Uterus and endometrium otherwise within normal limits.

## 2019-11-08 DIAGNOSIS — Z20822 Contact with and (suspected) exposure to covid-19: Secondary | ICD-10-CM | POA: Diagnosis not present

## 2019-11-26 DIAGNOSIS — Z113 Encounter for screening for infections with a predominantly sexual mode of transmission: Secondary | ICD-10-CM | POA: Diagnosis not present

## 2019-11-26 DIAGNOSIS — N898 Other specified noninflammatory disorders of vagina: Secondary | ICD-10-CM | POA: Diagnosis not present

## 2019-11-26 DIAGNOSIS — R8290 Unspecified abnormal findings in urine: Secondary | ICD-10-CM | POA: Diagnosis not present

## 2019-12-16 DIAGNOSIS — D649 Anemia, unspecified: Secondary | ICD-10-CM | POA: Diagnosis not present

## 2019-12-16 DIAGNOSIS — Z6831 Body mass index (BMI) 31.0-31.9, adult: Secondary | ICD-10-CM | POA: Diagnosis not present

## 2019-12-16 DIAGNOSIS — Z01419 Encounter for gynecological examination (general) (routine) without abnormal findings: Secondary | ICD-10-CM | POA: Diagnosis not present

## 2020-01-03 DIAGNOSIS — R102 Pelvic and perineal pain: Secondary | ICD-10-CM | POA: Diagnosis not present

## 2020-01-03 DIAGNOSIS — N920 Excessive and frequent menstruation with regular cycle: Secondary | ICD-10-CM | POA: Diagnosis not present

## 2020-01-03 DIAGNOSIS — D649 Anemia, unspecified: Secondary | ICD-10-CM | POA: Diagnosis not present

## 2020-02-14 IMAGING — US US AXILLARY RIGHT
1 series · 7 of 7 positions shown · non-contrast
Comparison: Previous exam(s).

CLINICAL DATA: 38-year-old female with palpable lump in the UPPER
RIGHT axilla discovered on self-examination. Strong family history
of breast cancer

EXAM:
DIGITAL DIAGNOSTIC BILATERAL MAMMOGRAM WITH CAD AND TOMO
ULTRASOUND RIGHT AXILLA

[Series 1: us axillary right · 0.05mm/px · 7 of 7 slices shown]
[im 1/7]
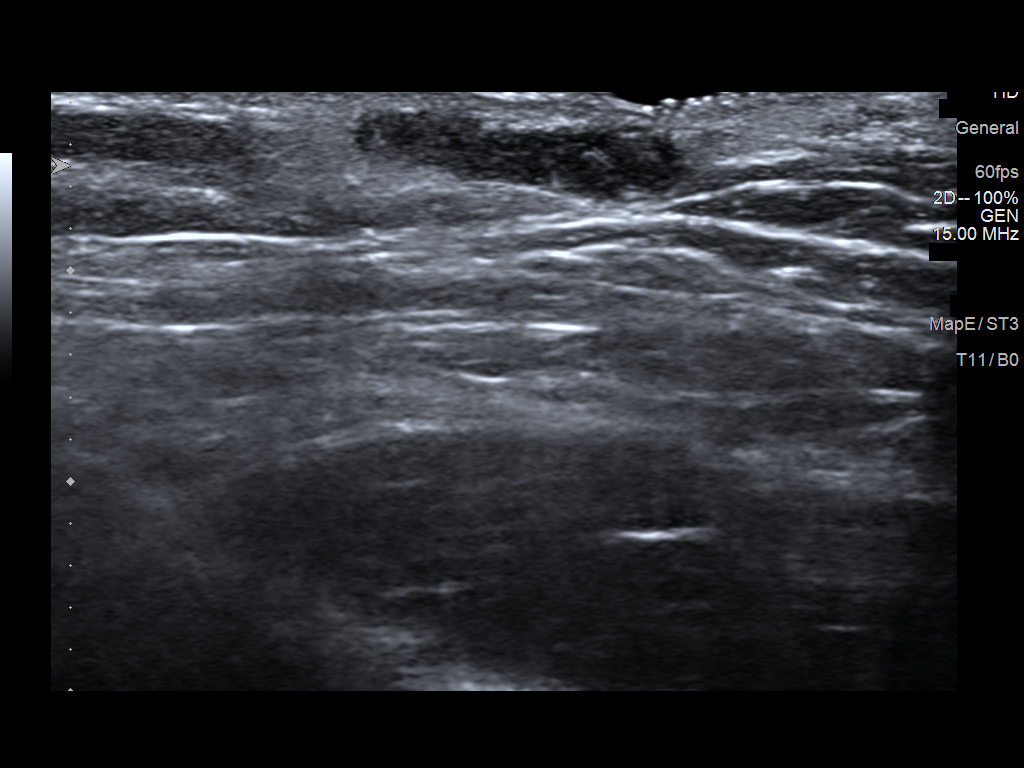
[im 2/7]
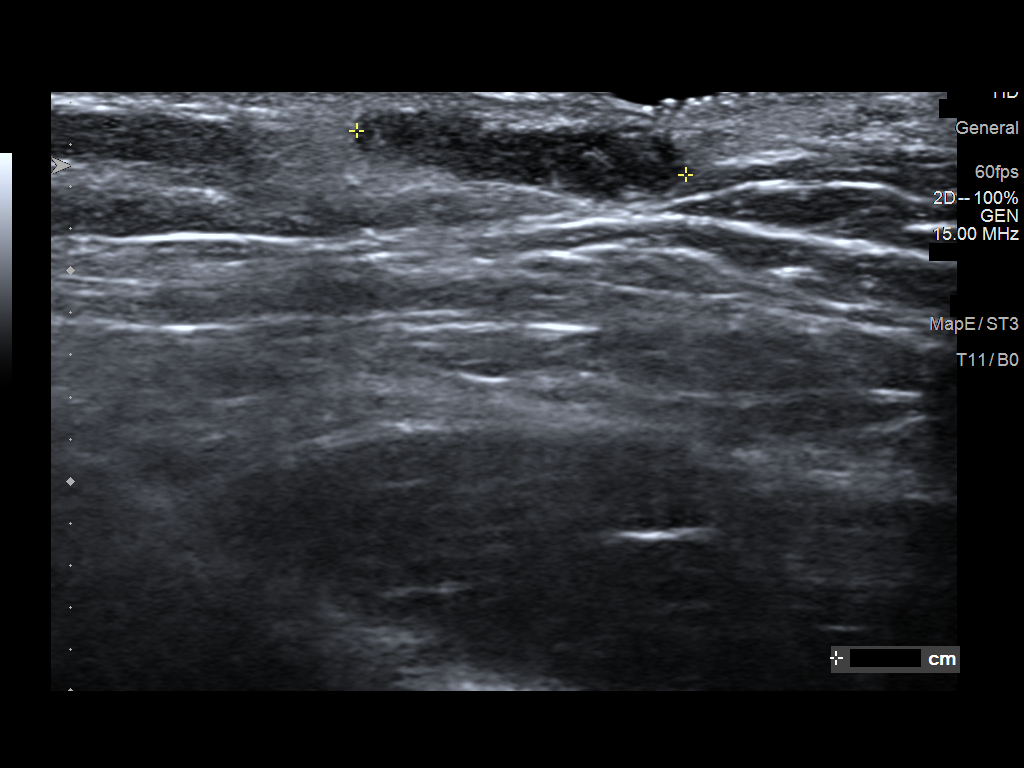
[im 3/7]
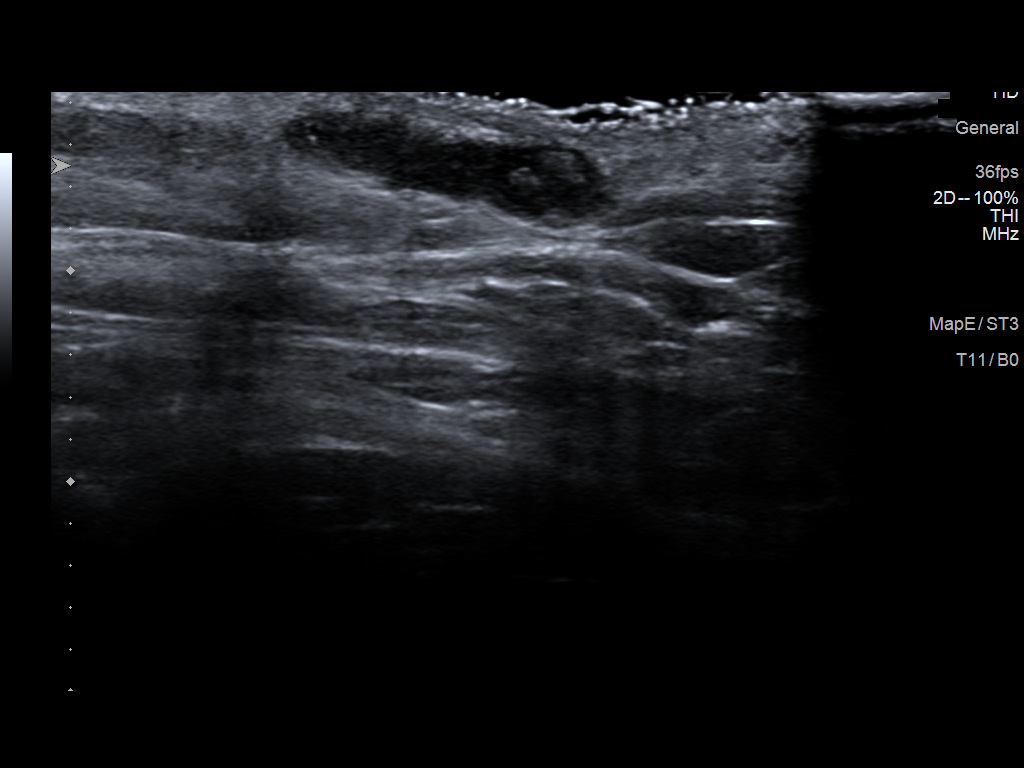
[im 4/7]
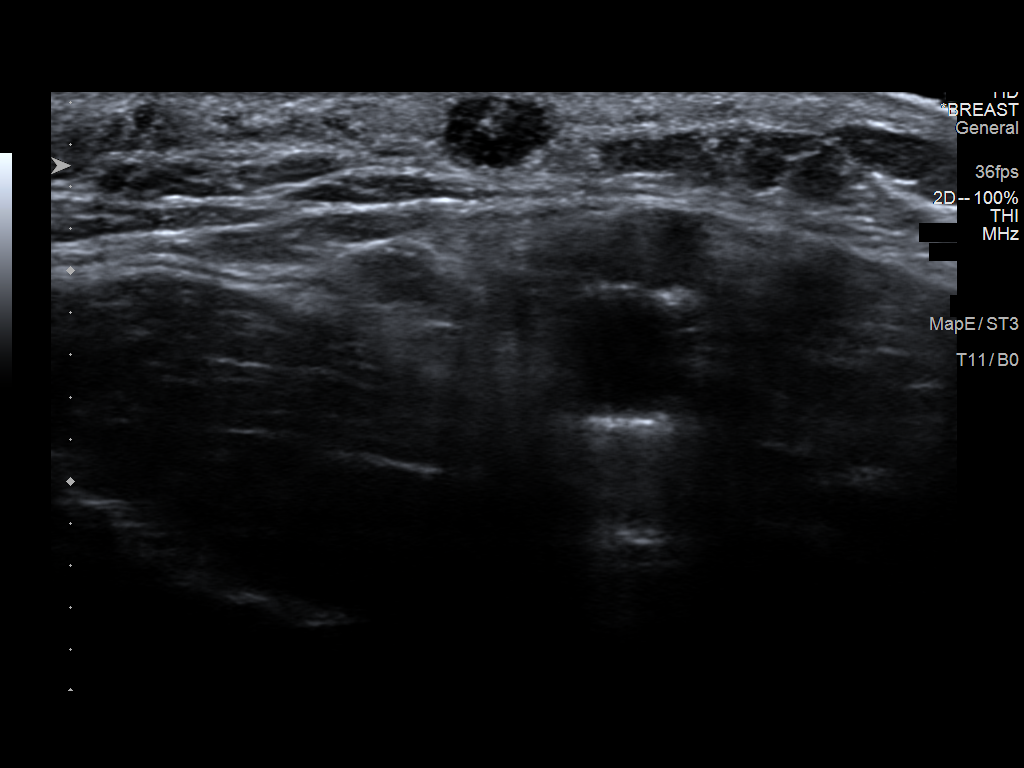
[im 5/7]
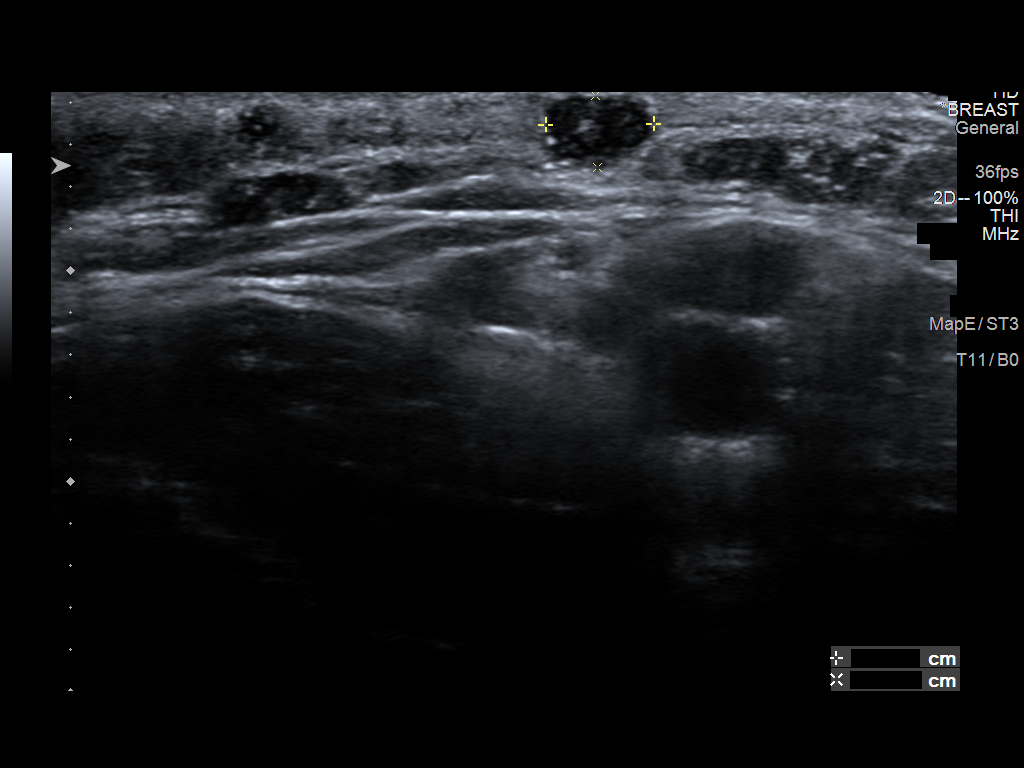
[im 6/7]
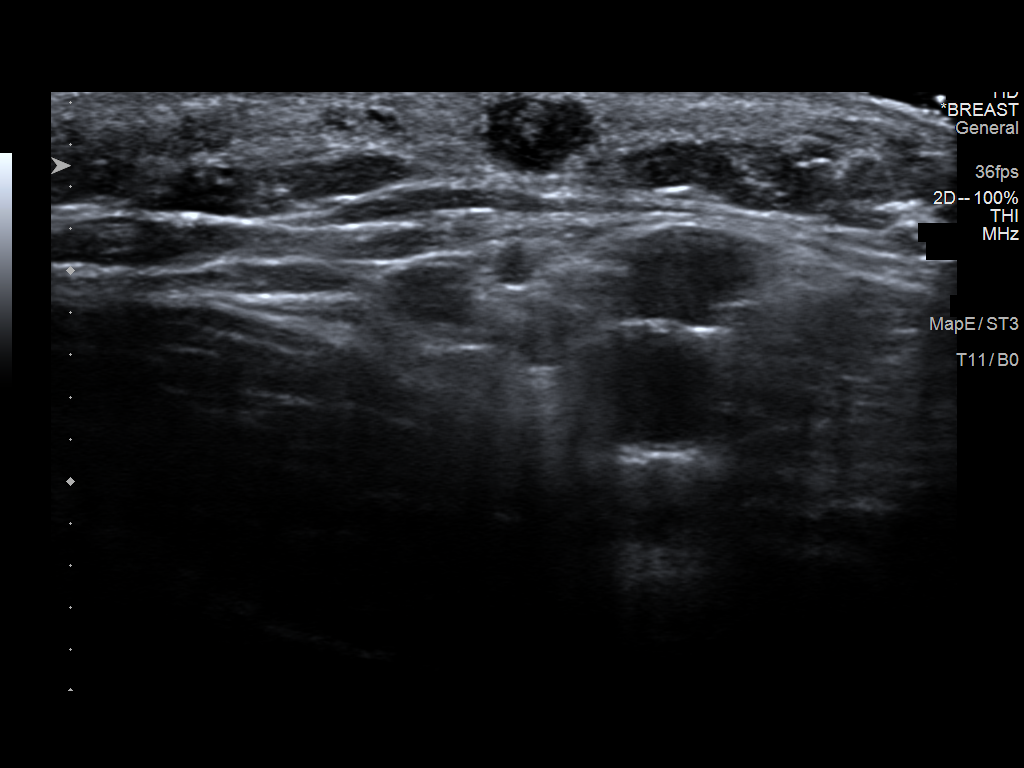
[im 7/7]
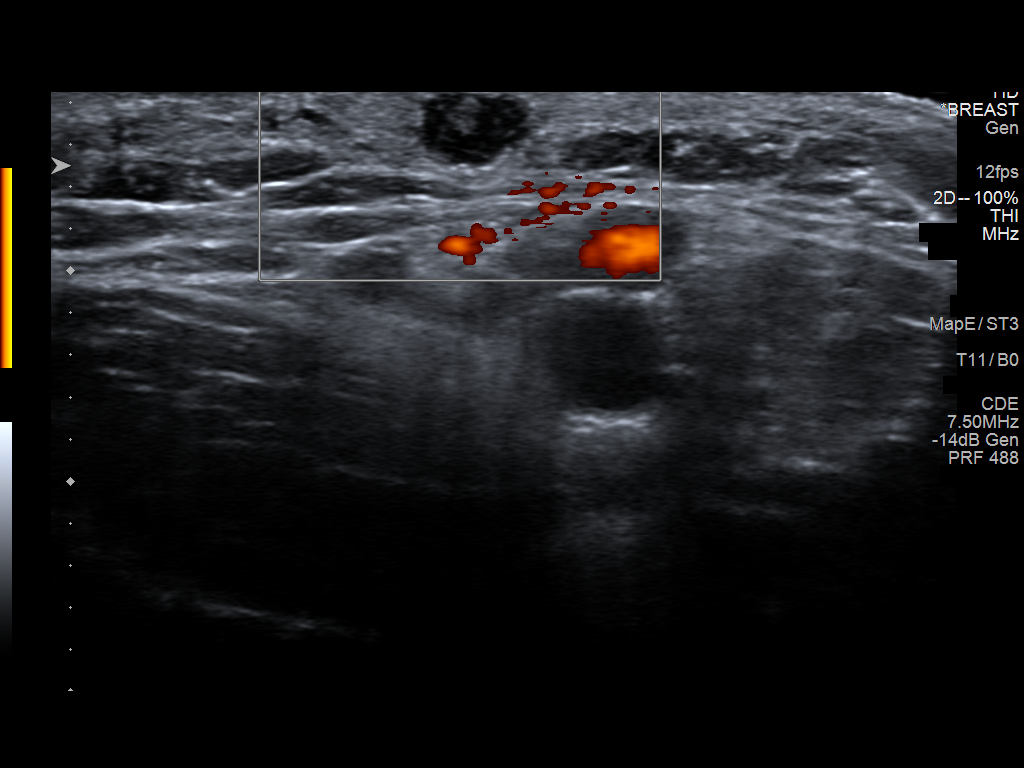

[7 of 7 positions shown; findings below may reference images not displayed]

ACR Breast Density Category c: The breast tissue is heterogeneously
dense, which may obscure small masses.
FINDINGS: 2D/3D full field views of both breasts and a spot compression view
of the RIGHT axilla demonstrate no suspicious mass, distortion or
worrisome calcifications.

Mammographic images were processed with CAD.

On physical exam, palpable thickening in the high RIGHT axilla
noted.

Targeted ultrasound is performed, showing a 0.5 x 0.3 x 1.6 cm
collection within the skin of the UPPER RIGHT axilla compatible with
a epidermoid/sebaceous cyst. This corresponds to the patient's
palpable abnormality.
IMPRESSION: 1. Benign epidermoid/sebaceous cyst within the high RIGHT axilla
corresponding to the patient's palpable abnormality.
2. No mammographic evidence of breast malignancy.

RECOMMENDATION:
Bilateral screening mammogram in 1 year.

I have discussed the findings and recommendations with the patient.
If applicable, a reminder letter will be sent to the patient
regarding the next appointment.

BI-RADS CATEGORY  2: Benign.

## 2020-02-28 DIAGNOSIS — F329 Major depressive disorder, single episode, unspecified: Secondary | ICD-10-CM | POA: Diagnosis not present

## 2020-03-23 ENCOUNTER — Other Ambulatory Visit: Payer: Self-pay | Admitting: Family Medicine

## 2020-03-23 DIAGNOSIS — Z1231 Encounter for screening mammogram for malignant neoplasm of breast: Secondary | ICD-10-CM

## 2020-03-30 DIAGNOSIS — K219 Gastro-esophageal reflux disease without esophagitis: Secondary | ICD-10-CM | POA: Diagnosis not present

## 2020-03-30 DIAGNOSIS — F329 Major depressive disorder, single episode, unspecified: Secondary | ICD-10-CM | POA: Diagnosis not present

## 2020-03-30 DIAGNOSIS — E611 Iron deficiency: Secondary | ICD-10-CM | POA: Diagnosis not present

## 2020-03-30 DIAGNOSIS — I10 Essential (primary) hypertension: Secondary | ICD-10-CM | POA: Diagnosis not present

## 2020-03-30 DIAGNOSIS — Z0000 Encounter for general adult medical examination without abnormal findings: Secondary | ICD-10-CM | POA: Diagnosis not present

## 2020-03-30 DIAGNOSIS — Z23 Encounter for immunization: Secondary | ICD-10-CM | POA: Diagnosis not present

## 2020-03-30 DIAGNOSIS — R739 Hyperglycemia, unspecified: Secondary | ICD-10-CM | POA: Diagnosis not present

## 2020-05-01 ENCOUNTER — Ambulatory Visit
Admission: RE | Admit: 2020-05-01 | Discharge: 2020-05-01 | Disposition: A | Discharge status: Home / Self Care | Payer: BC Managed Care – PPO | Source: Ambulatory Visit | Attending: Family Medicine | Admitting: Family Medicine

## 2020-05-01 ENCOUNTER — Other Ambulatory Visit: Payer: Self-pay

## 2020-05-01 DIAGNOSIS — Z1231 Encounter for screening mammogram for malignant neoplasm of breast: Secondary | ICD-10-CM

## 2020-05-09 DIAGNOSIS — B373 Candidiasis of vulva and vagina: Secondary | ICD-10-CM | POA: Diagnosis not present

## 2020-05-09 DIAGNOSIS — R059 Cough, unspecified: Secondary | ICD-10-CM | POA: Diagnosis not present

## 2020-06-22 ENCOUNTER — Other Ambulatory Visit: Payer: BC Managed Care – PPO

## 2021-01-31 DIAGNOSIS — R450 Nervousness: Secondary | ICD-10-CM | POA: Diagnosis not present

## 2021-02-07 DIAGNOSIS — F329 Major depressive disorder, single episode, unspecified: Secondary | ICD-10-CM | POA: Diagnosis not present

## 2021-02-07 DIAGNOSIS — K219 Gastro-esophageal reflux disease without esophagitis: Secondary | ICD-10-CM | POA: Diagnosis not present

## 2021-02-07 DIAGNOSIS — R5383 Other fatigue: Secondary | ICD-10-CM | POA: Diagnosis not present

## 2021-02-07 DIAGNOSIS — R7309 Other abnormal glucose: Secondary | ICD-10-CM | POA: Diagnosis not present

## 2021-02-07 DIAGNOSIS — E559 Vitamin D deficiency, unspecified: Secondary | ICD-10-CM | POA: Diagnosis not present

## 2021-05-07 ENCOUNTER — Other Ambulatory Visit: Payer: Self-pay | Admitting: Obstetrics and Gynecology

## 2021-05-07 DIAGNOSIS — Z1231 Encounter for screening mammogram for malignant neoplasm of breast: Secondary | ICD-10-CM

## 2021-06-07 ENCOUNTER — Ambulatory Visit
Admission: RE | Admit: 2021-06-07 | Discharge: 2021-06-07 | Disposition: A | Discharge status: Home / Self Care | Payer: BC Managed Care – PPO | Source: Ambulatory Visit | Attending: Obstetrics and Gynecology | Admitting: Obstetrics and Gynecology

## 2021-06-07 DIAGNOSIS — Z1231 Encounter for screening mammogram for malignant neoplasm of breast: Secondary | ICD-10-CM

## 2021-07-30 DIAGNOSIS — Z13220 Encounter for screening for lipoid disorders: Secondary | ICD-10-CM | POA: Diagnosis not present

## 2021-07-30 DIAGNOSIS — Z136 Encounter for screening for cardiovascular disorders: Secondary | ICD-10-CM | POA: Diagnosis not present

## 2021-07-30 DIAGNOSIS — Z713 Dietary counseling and surveillance: Secondary | ICD-10-CM | POA: Diagnosis not present

## 2021-07-30 DIAGNOSIS — Z6831 Body mass index (BMI) 31.0-31.9, adult: Secondary | ICD-10-CM | POA: Diagnosis not present

## 2021-11-25 DIAGNOSIS — R8281 Pyuria: Secondary | ICD-10-CM | POA: Diagnosis not present

## 2021-11-25 DIAGNOSIS — B3731 Acute candidiasis of vulva and vagina: Secondary | ICD-10-CM | POA: Diagnosis not present

## 2021-11-30 DIAGNOSIS — R300 Dysuria: Secondary | ICD-10-CM | POA: Diagnosis not present

## 2022-02-02 ENCOUNTER — Emergency Department (HOSPITAL_COMMUNITY): Payer: BC Managed Care – PPO

## 2022-02-02 ENCOUNTER — Emergency Department (HOSPITAL_COMMUNITY)
Admission: EM | Admit: 2022-02-02 | Discharge: 2022-02-02 | Disposition: A | Discharge status: Home / Self Care | Payer: BC Managed Care – PPO | Attending: Emergency Medicine | Admitting: Emergency Medicine

## 2022-02-02 ENCOUNTER — Other Ambulatory Visit: Payer: Self-pay

## 2022-02-02 ENCOUNTER — Encounter (HOSPITAL_COMMUNITY): Payer: Self-pay | Admitting: Emergency Medicine

## 2022-02-02 DIAGNOSIS — R1031 Right lower quadrant pain: Secondary | ICD-10-CM | POA: Diagnosis not present

## 2022-02-02 DIAGNOSIS — N858 Other specified noninflammatory disorders of uterus: Secondary | ICD-10-CM | POA: Diagnosis not present

## 2022-02-02 DIAGNOSIS — D72829 Elevated white blood cell count, unspecified: Secondary | ICD-10-CM | POA: Diagnosis not present

## 2022-02-02 DIAGNOSIS — R102 Pelvic and perineal pain: Secondary | ICD-10-CM

## 2022-02-02 DIAGNOSIS — D259 Leiomyoma of uterus, unspecified: Secondary | ICD-10-CM

## 2022-02-02 DIAGNOSIS — D250 Submucous leiomyoma of uterus: Secondary | ICD-10-CM | POA: Diagnosis not present

## 2022-02-02 LAB — URINALYSIS, ROUTINE W REFLEX MICROSCOPIC
Bilirubin Urine: NEGATIVE
Glucose, UA: NEGATIVE mg/dL
Ketones, ur: NEGATIVE mg/dL
Nitrite: NEGATIVE
Protein, ur: 30 mg/dL — AB
Specific Gravity, Urine: 1.023 (ref 1.005–1.030)
pH: 5 (ref 5.0–8.0)

## 2022-02-02 LAB — CBC
HCT: 29.2 % — ABNORMAL LOW (ref 36.0–46.0)
Hemoglobin: 8.6 g/dL — ABNORMAL LOW (ref 12.0–15.0)
MCH: 21 pg — ABNORMAL LOW (ref 26.0–34.0)
MCHC: 29.5 g/dL — ABNORMAL LOW (ref 30.0–36.0)
MCV: 71.2 fL — ABNORMAL LOW (ref 80.0–100.0)
Platelets: 465 10*3/uL — ABNORMAL HIGH (ref 150–400)
RBC: 4.1 MIL/uL (ref 3.87–5.11)
RDW: 19.7 % — ABNORMAL HIGH (ref 11.5–15.5)
WBC: 16.3 10*3/uL — ABNORMAL HIGH (ref 4.0–10.5)
nRBC: 0 % (ref 0.0–0.2)

## 2022-02-02 LAB — I-STAT BETA HCG BLOOD, ED (MC, WL, AP ONLY): I-stat hCG, quantitative: <5 m[IU]/mL (ref <5)

## 2022-02-02 LAB — COMPREHENSIVE METABOLIC PANEL
ALT: 8 U/L (ref 0–44)
AST: 15 U/L (ref 15–41)
Albumin: 3.5 g/dL (ref 3.5–5.0)
Alkaline Phosphatase: 54 U/L (ref 38–126)
Anion gap: 10 (ref 5–15)
BUN: 8 mg/dL (ref 6–20)
CO2: 21 mmol/L — ABNORMAL LOW (ref 22–32)
Calcium: 9 mg/dL (ref 8.9–10.3)
Chloride: 104 mmol/L (ref 98–111)
Creatinine, Ser: 0.82 mg/dL (ref 0.44–1.00)
GFR, Estimated: >60 mL/min (ref >60)
Glucose, Bld: 147 mg/dL — ABNORMAL HIGH (ref 70–99)
Potassium: 3.7 mmol/L (ref 3.5–5.1)
Sodium: 135 mmol/L (ref 135–145)
Total Bilirubin: 0.4 mg/dL (ref 0.3–1.2)
Total Protein: 7.4 g/dL (ref 6.5–8.1)

## 2022-02-02 LAB — LIPASE, BLOOD: Lipase: 29 U/L (ref 11–51)

## 2022-02-02 MED ORDER — HYDROMORPHONE HCL 1 MG/ML IJ SOLN
1.0000 mg | Freq: Once | INTRAMUSCULAR | Status: AC
  Administered 2022-02-02: 1 mg via INTRAVENOUS
  Filled 2022-02-02: qty 1

## 2022-02-02 MED ORDER — IOHEXOL 300 MG/ML  SOLN
100.0000 mL | Freq: Once | INTRAMUSCULAR | Status: AC | PRN
Start: 2022-02-02 — End: 2022-02-02
  Administered 2022-02-02: 100 mL via INTRAVENOUS

## 2022-02-02 MED ORDER — KETOROLAC TROMETHAMINE 15 MG/ML IJ SOLN
15.0000 mg | Freq: Once | INTRAMUSCULAR | Status: AC
  Administered 2022-02-02: 15 mg via INTRAVENOUS
  Filled 2022-02-02: qty 1

## 2022-02-02 MED ORDER — SODIUM CHLORIDE 0.9 % IV BOLUS
1000.0000 mL | Freq: Once | INTRAVENOUS | Status: AC
  Administered 2022-02-02: 1000 mL via INTRAVENOUS

## 2022-02-02 MED ORDER — OXYCODONE-ACETAMINOPHEN 5-325 MG PO TABS: 1.0000 | ORAL_TABLET | Freq: Four times a day (QID) | ORAL | 0 refills | Status: AC | PRN

## 2022-02-02 MED ORDER — NAPROXEN 375 MG PO TABS: 375.0000 mg | ORAL_TABLET | Freq: Two times a day (BID) | ORAL | 0 refills | Status: DC

## 2022-02-02 NOTE — ED Notes (Signed)
ED provider at bedside at this time 

## 2022-02-02 NOTE — ED Triage Notes (Signed)
Patient reports RLQ abdominal pain radiating to right pelvic area onset Thursday , no emesis or diarrhea , denies hematuria or dysuria .

## 2022-02-02 NOTE — ED Notes (Signed)
Pt transported to US via wheelchair.

## 2022-02-02 NOTE — ED Provider Notes (Signed)
[facility] Emergency Department Provider Note MRN:  [id number]  Arrival date & time: 02/02/22     Chief Complaint   Abdominal Pain   History of Present Illness   Jenna Mcclure is a 41 y.o. year-old female with no pertinent past medical history presenting to the ED with chief complaint of abdominal pain.  Right lower quadrant abdominal pain for the past 2 days, moderate to severe, not going away.  Denies nausea, no vomiting, no diarrhea.  Started her menstrual cycle a few days ago.  Review of Systems  A thorough review of systems was obtained and all systems are negative except as noted in the HPI and PMH.   Patient's Health History    Past Medical History:  Diagnosis Date   Allergic rhinitis    Borderline hypertension    Breastfeeding (infant)    GERD (gastroesophageal reflux disease)    Heart murmur    MILD -  ASYMPTOMATIC   History of abnormal cervical Pap smear    History of ovarian cyst    TMJ (dislocation of temporomandibular joint)    Wears glasses     Past Surgical History:  Procedure Laterality Date   DIAGNOSTIC LAPAROSCOPY  2009   To look for endometriosis- negative per patient   LAPAROSCOPIC TUBAL LIGATION Bilateral 02/15/2014   Procedure: LAPAROSCOPIC TUBAL LIGATION WITH FILSHIE CLIPS;  Surgeon: Margarette Asal, MD;  Location: [facility];  Service: Gynecology;  Laterality: Bilateral;    Family History  Problem Relation Age of Onset   Hypertension Mother    Cancer Mother        bladder   Cancer Father        leaukemia   Hypertension Maternal Aunt    Diabetes Maternal Uncle    Diabetes Maternal Grandmother    Diabetes Paternal Grandmother     Social History   Socioeconomic History   Marital status: Married    Spouse name: Not on file   Number of children: 6   Years of education: 14   Highest education level: Not on file  Occupational History    Comment: [employer]  Tobacco Use   Smoking status:  Never   Smokeless tobacco: Never  Substance and Sexual Activity   Alcohol use: Yes    Comment: RARE   Drug use: No   Sexual activity: Yes    Birth control/protection: Surgical  Other Topics Concern   Not on file  Social History Narrative   Lives with family   Mother died 2016/06/28 from cancer.   Caffeine - 2 x weekly   Social Determinants of Health   Financial Resource Strain: Not on file  Food Insecurity: Not on file  Transportation Needs: Not on file  Physical Activity: Not on file  Stress: Not on file  Social Connections: Not on file  Intimate Partner Violence: Not on file     Physical Exam   Vitals:   02/02/22 0625 02/02/22 0700  BP:  130/78  Pulse:  89  Resp:  17  Temp: 98.9 F (37.2 C)   SpO2:  99%    CONSTITUTIONAL: Well-appearing, in moderate distress due to pain NEURO/PSYCH:  Alert and oriented x 3, no focal deficits EYES:  eyes equal and reactive ENT/NECK:  no LAD, no JVD CARDIO: Regular rate, well-perfused, normal S1 and S2 PULM:  CTAB no wheezing or rhonchi GI/GU:  non-distended, tender right lower quadrant MSK/SPINE:  No gross deformities, no edema SKIN:  no rash,  atraumatic   *Additional and/or pertinent findings included in MDM below  Diagnostic and Interventional Summary    EKG Interpretation  Date/Time:    Ventricular Rate:    PR Interval:    QRS Duration:   QT Interval:    QTC Calculation:   R Axis:     Text Interpretation:         Labs Reviewed  COMPREHENSIVE METABOLIC PANEL - Abnormal; Notable for the following components:      Result Value   CO2 21 (*)    Glucose, Bld 147 (*)    All other components within normal limits  CBC - Abnormal; Notable for the following components:   WBC 16.3 (*)    Hemoglobin 8.6 (*)    HCT 29.2 (*)    MCV 71.2 (*)    MCH 21.0 (*)    MCHC 29.5 (*)    RDW 19.7 (*)    Platelets 465 (*)    All other components within normal limits  URINALYSIS, ROUTINE W REFLEX MICROSCOPIC - Abnormal; Notable  for the following components:   APPearance HAZY (*)    Hgb urine dipstick LARGE (*)    Protein, ur 30 (*)    Leukocytes,Ua TRACE (*)    Bacteria, UA FEW (*)    All other components within normal limits  LIPASE, BLOOD  PREGNANCY, URINE  I-STAT BETA HCG BLOOD, ED ([facility], [facility], [facility] ONLY)    CT ABDOMEN PELVIS W CONTRAST  Final Result    US Pelvis Complete  Final Result    US Transvaginal Non-OB  Final Result    [place] Art/Ven Flow Abd Pelv Doppler  Final Result      Medications  HYDROmorphone (DILAUDID) injection 1 mg (1 mg Intravenous Given 02/02/22 0429)  ketorolac (TORADOL) 15 MG/ML injection 15 mg (15 mg Intravenous Given 02/02/22 0429)  sodium chloride 0.9 % bolus 1,000 mL (0 mLs Intravenous Stopped 02/02/22 0459)  iohexol (OMNIPAQUE) 300 MG/ML solution 100 mL (100 mLs Intravenous Contrast Given 02/02/22 0543)  HYDROmorphone (DILAUDID) injection 1 mg (1 mg Intravenous Given 02/02/22 8786)     Procedures  /  Critical Care Procedures  ED Course and Medical Decision Making  Initial Impression and Ddx Differential diagnosis includes appendicitis, ovarian torsion, ruptured ovarian cyst, kidney stone  Past medical/surgical history that increases complexity of ED encounter: None  Interpretation of Diagnostics I personally reviewed the laboratory assessment and my interpretation is as follows: Leukocytosis, hemoglobin is down trended at 8.6,  CT imaging revealing migrated/dislodged tubal ligation clips, also revealing a 6 cm uterine cystic mass, question degenerating fibroid.  Patient Reassessment and Ultimate Disposition/Management     Patient continues to have fairly significant pain, redosing Dilaudid, will reach out to OB/GYN for recommendations.  Patient may need admission for pain control.  Patient management required discussion with the following services or consulting groups:  OB/GYN  Complexity of Problems Addressed Acute illness or injury that poses threat of life of bodily  function  Additional Data Reviewed and Analyzed Further history obtained from: None  Additional Factors Impacting ED Encounter Risk Use of parenteral controlled substances and Consideration of hospitalization  Barth Kirks. Sedonia Small, [place] [email or web address]  Final Clinical Impressions(s) / ED Diagnoses     ICD-10-CM   1. Pelvic pain  R10.2       ED Discharge Orders     None        Discharge Instructions Discussed with and Provided to Patient:  Discharge Instructions   None      Maudie Flakes, MD 02/02/22 [phone]

## 2022-02-02 NOTE — ED Notes (Signed)
Pt returned from ct

## 2022-02-02 NOTE — ED Notes (Signed)
Pt in ct at this time ° °

## 2022-02-02 NOTE — ED Notes (Signed)
Pt returned from [place]. CT called at this time

## 2022-02-02 NOTE — Discharge Instructions (Addendum)
Take the medications as needed for pain.  Follow up with your [facility] GYN doctor for further evaluation as we discussed.  Return as needed for worsening symptoms

## 2022-02-02 NOTE — ED Provider Notes (Signed)
Patient initially seen by Dr. Sedonia Small.  Please see his note.  Patient states she is feeling better now.  She would like to try and go home with pain management.   Dorie Rank, MD 02/02/22 [phone]

## 2022-02-05 DIAGNOSIS — D259 Leiomyoma of uterus, unspecified: Secondary | ICD-10-CM | POA: Diagnosis not present

## 2022-02-05 DIAGNOSIS — D649 Anemia, unspecified: Secondary | ICD-10-CM | POA: Diagnosis not present

## 2022-02-05 DIAGNOSIS — N920 Excessive and frequent menstruation with regular cycle: Secondary | ICD-10-CM | POA: Diagnosis not present

## 2022-03-20 DIAGNOSIS — D259 Leiomyoma of uterus, unspecified: Secondary | ICD-10-CM | POA: Diagnosis not present

## 2022-03-20 DIAGNOSIS — D649 Anemia, unspecified: Secondary | ICD-10-CM | POA: Diagnosis not present

## 2022-03-27 DIAGNOSIS — N760 Acute vaginitis: Secondary | ICD-10-CM | POA: Diagnosis not present

## 2022-04-30 NOTE — H&P (Signed)
NAME: Jenna Mcclure, Jenna Mcclure RECORD NO: [id number] ACCOUNT NO: [id number] DATE OF BIRTH: 06-18-1980 PHYSICIAN: Ralene Bathe. Matthew Saras, MD  History and Physical   DATE OF ADMISSION: 05/13/2022  She is scheduled for hysterectomy at [facility] on 05/13/2022.  CHIEF COMPLAINT:  Symptomatic leiomyoma.  HISTORY OF PRESENT ILLNESS:  A 41 year old G6 P6, prior tubal with Filshie clips who has had symptomatic leiomyoma, this fall, was evaluated by my partner, Dr. Gaetano Net when she had some more acute pain after ED visit.  The CT that showed fibroid that may  have had some areas of degeneration.  Also, noted that the Filshie clips had migrated, which we discussed also.  The pain has been such that she would prefer definitive treatment.  We discussed TAH.  This procedure including specific risks regarding  bleeding, infection, wound infection, phlebitis, transfusion along with her postoperative recovery time discussed.  We certainly try to locate the Filshie clips and remove them if feasible, which we reviewed also.  Of note, the CT showed fibroid 6.3 cm with some areas of central degeneration.  The rationale for bilateral salpingectomy with conservation of otherwise normal ovaries discussed.  PAST MEDICAL HISTORY: ALLERGIES:  None.  CURRENT MEDICATIONS:  Amantadine 40 mg once daily, iron daily, Naprosyn or oxycodone p.r.n. and other vitamin supplements.  OB HISTORY: She is a G6 P6 delivered vaginally.  PAST SURGICAL HISTORY:  Tubal ligation with Filshie clips.  She has also had a prior diagnostic laparoscopy.  FAMILY HISTORY:  Significant for mother with hypertension.  Grandfather, grandmother and uncle with diabetes.  Father with leukemia.  Mother had bladder cancer.  SOCIAL HISTORY:  She is married, prior tubal, she is a nonsmoker, does not consume alcohol.  PHYSICAL EXAMINATION:   VITAL SIGNS:  Temperature 98.2, blood pressure 130/70. HEENT:  Unremarkable. NECK:  Supple, without  masses. LUNGS:  Clear. CARDIOVASCULAR:  Regular rate and rhythm without murmurs, rubs or gallops.  Breasts without masses, tenderness, or nipple discharge. ABDOMEN:  Soft, flat, nontender. PELVIC:  Vulva, vagina, cervix normal.  Uterus 10 week size.  Adnexa negative. EXTREMITIES:  Unremarkable. NEUROLOGIC:  Unremarkable.  ASSESSMENT:  Symptomatic leiomyoma, specimen of Filshie clips as noted on the CT.  PLAN:  TAH bilateral salpingectomy, we will plan to remove the Filshie clips if they can be isolated and removed easily.  Procedure and risks discussed as above.   PUS D: 04/29/2022 2:12:59 pm T: 04/29/2022 5:41:00 pm  JOB: [id number]/ [id number]

## 2022-05-01 DIAGNOSIS — R102 Pelvic and perineal pain: Secondary | ICD-10-CM | POA: Diagnosis not present

## 2022-05-01 DIAGNOSIS — D259 Leiomyoma of uterus, unspecified: Secondary | ICD-10-CM | POA: Diagnosis not present

## 2022-05-02 ENCOUNTER — Other Ambulatory Visit: Payer: Self-pay

## 2022-05-02 ENCOUNTER — Encounter (HOSPITAL_BASED_OUTPATIENT_CLINIC_OR_DEPARTMENT_OTHER): Payer: Self-pay | Admitting: Obstetrics and Gynecology

## 2022-05-02 NOTE — Progress Notes (Signed)
Spoke w/ via phone for pre-op interview---Dannielle Lab needs dos----  urine pregnancy             Lab results------05/09/22 lab appt for cbc, type & screen COVID test -----patient states asymptomatic no test needed Arrive at -------0530 on Monday, 05/13/22 NPO after MN NO Solid Food.  Clear liquids from MN until---0430 Med rec completed Medications to take morning of surgery -----Famotidine Diabetic medication -----n/a Patient instructed no nail polish to be worn day of surgery Patient instructed to bring photo id and insurance card day of surgery Patient aware to have Driver (ride ) / caregiver  for 24 hours after surgery - husband or daughter  Patient Special Instructions -----Extended / overnight instructions given. Pre-Op special Istructions -----none Patient verbalized understanding of instructions that were given at this phone interview. Patient denies shortness of breath, chest pain, fever, cough at this phone interview.

## 2022-05-02 NOTE — Progress Notes (Signed)
Your procedure is scheduled on Monday, 05/13/22.  Report to [facility]   Call this number if you have problems the morning of surgery  [phone].   OUR ADDRESS IS [place].  WE ARE LOCATED IN [facility].  PLEASE BRING YOUR INSURANCE CARD AND PHOTO ID DAY OF SURGERY.  ONLY 2 PEOPLE ARE ALLOWED IN  WAITING  ROOM.                                      REMEMBER:  DO NOT EAT FOOD, CANDY GUM OR MINTS  AFTER MIDNIGHT THE NIGHT BEFORE YOUR SURGERY . YOU MAY HAVE CLEAR LIQUIDS FROM MIDNIGHT THE NIGHT BEFORE YOUR SURGERY UNTIL  4:30 AM. NO CLEAR LIQUIDS AFTER  4:30 AM DAY OF SURGERY.  YOU MAY  BRUSH YOUR TEETH MORNING OF SURGERY AND RINSE YOUR MOUTH OUT, NO CHEWING GUM CANDY OR MINTS.     CLEAR LIQUID DIET   Foods Allowed                                                                     Foods Excluded  Coffee and tea, regular and decaf                             liquids that you cannot  Plain Jell-O                                                                   see through such as: Fruit ices (not with fruit pulp)                                     milk, soups, orange juice  Plain  Popsicles                                    All solid food Carbonated beverages, regular and diet                                    Cranberry, grape and apple juices Sports drinks like Gatorade _____________________________________________________________________     TAKE ONLY THESE MEDICATIONS MORNING OF SURGERY:  Famotidine    UP TO 4 VISITORS  MAY VISIT IN THE EXTENDED RECOVERY ROOM UNTIL 800 PM ONLY.  ONE  VISITOR AGE 41 AND OVER MAY SPEND THE NIGHT AND MUST BE IN EXTENDED RECOVERY ROOM NO LATER THAN 800 PM . YOUR DISCHARGE TIME AFTER YOU SPEND THE NIGHT IS 900 AM THE MORNING AFTER YOUR SURGERY.  YOU MAY PACK A SMALL OVERNIGHT BAG WITH TOILETRIES FOR YOUR OVERNIGHT STAY  IF YOU WISH.  YOUR PRESCRIPTION MEDICATIONS WILL BE  PROVIDED DURING [place].                                      DO NOT WEAR JEWERLY, MAKE UP. DO NOT WEAR LOTIONS, POWDERS, PERFUMES OR NAIL POLISH ON YOUR FINGERNAILS. TOENAIL POLISH IS OK TO WEAR. DO NOT SHAVE FOR 48 HOURS PRIOR TO DAY OF SURGERY. MEN MAY SHAVE FACE AND NECK. CONTACTS, GLASSES, OR DENTURES MAY NOT BE WORN TO SURGERY.  REMEMBER: NO SMOKING, DRUGS OR ALCOHOL FOR 24 HOURS BEFORE YOUR SURGERY.                                    [facility] IS NOT RESPONSIBLE  FOR ANY BELONGINGS.                                                                    Marland Kitchen           [place] - Preparing for Surgery Before surgery, you can play an important role.  Because skin is not sterile, your skin needs to be as free of germs as possible.  You can reduce the number of germs on your skin by washing with CHG (chlorahexidine gluconate) soap before surgery.  CHG is an antiseptic cleaner which kills germs and bonds with the skin to continue killing germs even after washing. Please DO NOT use if you have an allergy to CHG or antibacterial soaps.  If your skin becomes reddened/irritated stop using the CHG and inform your nurse when you arrive at Short Stay. Do not shave (including legs and underarms) for at least 48 hours prior to the first CHG shower.  You may shave your face/neck. Please follow these instructions carefully:  1.  Shower with CHG Soap the night before surgery and the  morning of Surgery.  2.  If you choose to wash your hair, wash your hair first as usual with your  normal  shampoo.  3.  After you shampoo, rinse your hair and body thoroughly to remove the  shampoo.                                        4.  Use CHG as you would any other liquid soap.  You can apply chg directly  to the skin and wash , chg soap provided, night before and morning of your surgery.  5.  Apply the CHG Soap to your body ONLY FROM THE NECK DOWN.   Do not use on face/ open                           Wound  or open sores. Avoid contact with eyes, ears mouth and genitals (private parts).                       Wash face,  Genitals (private parts) with your normal soap.  6.  Wash thoroughly, paying special attention to the area where your surgery  will be performed.  7.  Thoroughly rinse your body with warm water from the neck down.  8.  DO NOT shower/wash with your normal soap after using and rinsing off  the CHG Soap.             9.  Pat yourself dry with a clean towel.            10.  Wear clean pajamas.            11.  Place clean sheets on your bed the night of your first shower and do not  sleep with pets. Day of Surgery : Do not apply any lotions/deodorants the morning of surgery.  Please wear clean clothes to the hospital/surgery center.  IF YOU HAVE ANY SKIN IRRITATION OR PROBLEMS WITH THE SURGICAL SOAP, PLEASE GET A BAR OF GOLD DIAL SOAP AND SHOWER THE NIGHT BEFORE YOUR SURGERY AND THE MORNING OF YOUR SURGERY. PLEASE LET THE NURSE KNOW MORNING OF YOUR SURGERY IF YOU HAD ANY PROBLEMS WITH THE SURGICAL SOAP.   ________________________________________________________________________                                                        QUESTIONS [facility] PRE OP NURSE PHONE [phone].

## 2022-05-08 DIAGNOSIS — D259 Leiomyoma of uterus, unspecified: Secondary | ICD-10-CM | POA: Diagnosis not present

## 2022-05-08 DIAGNOSIS — R102 Pelvic and perineal pain: Secondary | ICD-10-CM | POA: Diagnosis not present

## 2022-05-08 DIAGNOSIS — D649 Anemia, unspecified: Secondary | ICD-10-CM | POA: Diagnosis not present

## 2022-05-09 ENCOUNTER — Encounter (HOSPITAL_COMMUNITY)
Admission: RE | Admit: 2022-05-09 | Discharge: 2022-05-09 | Disposition: A | Discharge status: Home / Self Care | Payer: BC Managed Care – PPO | Source: Ambulatory Visit | Attending: Obstetrics and Gynecology | Admitting: Obstetrics and Gynecology

## 2022-05-09 DIAGNOSIS — Z01812 Encounter for preprocedural laboratory examination: Secondary | ICD-10-CM | POA: Insufficient documentation

## 2022-05-09 DIAGNOSIS — D251 Intramural leiomyoma of uterus: Secondary | ICD-10-CM | POA: Diagnosis not present

## 2022-05-09 LAB — CBC
HCT: 39.9 % (ref 36.0–46.0)
Hemoglobin: 12.6 g/dL (ref 12.0–15.0)
MCH: 26.9 pg (ref 26.0–34.0)
MCHC: 31.6 g/dL (ref 30.0–36.0)
MCV: 85.1 fL (ref 80.0–100.0)
Platelets: 246 10*3/uL (ref 150–400)
RBC: 4.69 MIL/uL (ref 3.87–5.11)
RDW: 17.5 % — ABNORMAL HIGH (ref 11.5–15.5)
WBC: 7.3 10*3/uL (ref 4.0–10.5)
nRBC: 0 % (ref 0.0–0.2)

## 2022-05-13 ENCOUNTER — Other Ambulatory Visit: Payer: Self-pay

## 2022-05-13 ENCOUNTER — Ambulatory Visit (HOSPITAL_BASED_OUTPATIENT_CLINIC_OR_DEPARTMENT_OTHER)
Admission: RE | Admit: 2022-05-13 | Discharge: 2022-05-14 | Disposition: A | Discharge status: Home / Self Care | Payer: BC Managed Care – PPO | Attending: Obstetrics and Gynecology | Admitting: Obstetrics and Gynecology

## 2022-05-13 ENCOUNTER — Ambulatory Visit (HOSPITAL_BASED_OUTPATIENT_CLINIC_OR_DEPARTMENT_OTHER): Payer: BC Managed Care – PPO | Admitting: Certified Registered Nurse Anesthetist

## 2022-05-13 ENCOUNTER — Encounter (HOSPITAL_BASED_OUTPATIENT_CLINIC_OR_DEPARTMENT_OTHER)
Admission: RE | Disposition: A | Discharge status: Home / Self Care | Payer: Self-pay | Source: Home / Self Care | Attending: Obstetrics and Gynecology

## 2022-05-13 ENCOUNTER — Encounter (HOSPITAL_BASED_OUTPATIENT_CLINIC_OR_DEPARTMENT_OTHER): Payer: Self-pay | Admitting: Obstetrics and Gynecology

## 2022-05-13 DIAGNOSIS — D259 Leiomyoma of uterus, unspecified: Secondary | ICD-10-CM | POA: Diagnosis not present

## 2022-05-13 DIAGNOSIS — N8003 Adenomyosis of the uterus: Secondary | ICD-10-CM | POA: Diagnosis not present

## 2022-05-13 DIAGNOSIS — D251 Intramural leiomyoma of uterus: Secondary | ICD-10-CM | POA: Diagnosis not present

## 2022-05-13 DIAGNOSIS — R102 Pelvic and perineal pain: Secondary | ICD-10-CM | POA: Diagnosis not present

## 2022-05-13 DIAGNOSIS — G709 Myoneural disorder, unspecified: Secondary | ICD-10-CM | POA: Diagnosis not present

## 2022-05-13 DIAGNOSIS — Z01818 Encounter for other preprocedural examination: Secondary | ICD-10-CM

## 2022-05-13 DIAGNOSIS — K219 Gastro-esophageal reflux disease without esophagitis: Secondary | ICD-10-CM | POA: Insufficient documentation

## 2022-05-13 DIAGNOSIS — D219 Benign neoplasm of connective and other soft tissue, unspecified: Secondary | ICD-10-CM | POA: Diagnosis not present

## 2022-05-13 HISTORY — DX: Pain, unspecified: R52

## 2022-05-13 HISTORY — DX: Leiomyoma of uterus, unspecified: D25.9

## 2022-05-13 HISTORY — DX: Bell's palsy: G51.0

## 2022-05-13 HISTORY — PX: HYSTERECTOMY ABDOMINAL WITH SALPINGECTOMY: SHX6725

## 2022-05-13 HISTORY — DX: Anemia, unspecified: D64.9

## 2022-05-13 LAB — TYPE AND SCREEN
ABO/RH(D): B NEG
Antibody Screen: NEGATIVE

## 2022-05-13 LAB — POCT PREGNANCY, URINE: Preg Test, Ur: NEGATIVE

## 2022-05-13 SURGERY — HYSTERECTOMY, TOTAL, ABDOMINAL, WITH SALPINGECTOMY
Anesthesia: General | Site: Abdomen | Laterality: Bilateral

## 2022-05-13 MED ORDER — MORPHINE SULFATE (PF) 4 MG/ML IV SOLN
INTRAVENOUS | Status: AC
  Filled 2022-05-13: qty 1

## 2022-05-13 MED ORDER — FENTANYL CITRATE (PF) 250 MCG/5ML IJ SOLN
INTRAMUSCULAR | Status: AC
  Filled 2022-05-13: qty 5

## 2022-05-13 MED ORDER — BUPIVACAINE LIPOSOME 1.3 % IJ SUSP
INTRAMUSCULAR | Status: AC
  Filled 2022-05-13: qty 20

## 2022-05-13 MED ORDER — ONDANSETRON HCL 4 MG/2ML IJ SOLN
INTRAMUSCULAR | Status: AC
  Filled 2022-05-13: qty 2

## 2022-05-13 MED ORDER — GABAPENTIN 300 MG PO CAPS
300.0000 mg | ORAL_CAPSULE | ORAL | Status: AC
  Administered 2022-05-13: 300 mg via ORAL

## 2022-05-13 MED ORDER — SUGAMMADEX SODIUM 200 MG/2ML IV SOLN
INTRAVENOUS | Status: DC | PRN
  Administered 2022-05-13: 200 mg via INTRAVENOUS

## 2022-05-13 MED ORDER — SODIUM CHLORIDE 0.9 % IV SOLN
INTRAVENOUS | Status: AC
  Filled 2022-05-13: qty 2

## 2022-05-13 MED ORDER — KETOROLAC TROMETHAMINE 30 MG/ML IJ SOLN
30.0000 mg | Freq: Four times a day (QID) | INTRAMUSCULAR | Status: DC
  Administered 2022-05-13 – 2022-05-14 (×4): 30 mg via INTRAVENOUS

## 2022-05-13 MED ORDER — DEXTROSE IN LACTATED RINGERS 5 % IV SOLN: INTRAVENOUS | Status: DC

## 2022-05-13 MED ORDER — MORPHINE SULFATE (PF) 4 MG/ML IV SOLN
1.0000 mg | INTRAVENOUS | Status: DC | PRN
  Administered 2022-05-13 – 2022-05-14 (×3): 2 mg via INTRAVENOUS

## 2022-05-13 MED ORDER — HYDROMORPHONE HCL 1 MG/ML IJ SOLN
INTRAMUSCULAR | Status: AC
  Filled 2022-05-13: qty 1

## 2022-05-13 MED ORDER — LIDOCAINE 2% (20 MG/ML) 5 ML SYRINGE
INTRAMUSCULAR | Status: DC | PRN
  Administered 2022-05-13: 60 mg via INTRAVENOUS

## 2022-05-13 MED ORDER — DEXAMETHASONE SODIUM PHOSPHATE 10 MG/ML IJ SOLN
INTRAMUSCULAR | Status: DC | PRN
  Administered 2022-05-13: 10 mg via INTRAVENOUS

## 2022-05-13 MED ORDER — BUPIVACAINE HCL (PF) 0.25 % IJ SOLN
INTRAMUSCULAR | Status: AC
  Filled 2022-05-13: qty 30

## 2022-05-13 MED ORDER — DEXMEDETOMIDINE HCL IN NACL 80 MCG/20ML IV SOLN
INTRAVENOUS | Status: DC | PRN
  Administered 2022-05-13 (×2): 8 ug via BUCCAL

## 2022-05-13 MED ORDER — KETOROLAC TROMETHAMINE 30 MG/ML IJ SOLN: 30.0000 mg | Freq: Four times a day (QID) | INTRAMUSCULAR | Status: DC

## 2022-05-13 MED ORDER — FENTANYL CITRATE (PF) 250 MCG/5ML IJ SOLN
INTRAMUSCULAR | Status: DC | PRN
  Administered 2022-05-13: 50 ug via INTRAVENOUS
  Administered 2022-05-13: 25 ug via INTRAVENOUS
  Administered 2022-05-13 (×2): 50 ug via INTRAVENOUS
  Administered 2022-05-13: 25 ug via INTRAVENOUS

## 2022-05-13 MED ORDER — LACTATED RINGERS IV SOLN: INTRAVENOUS | Status: DC

## 2022-05-13 MED ORDER — PROPOFOL 10 MG/ML IV BOLUS
INTRAVENOUS | Status: DC | PRN
  Administered 2022-05-13: 170 mg via INTRAVENOUS

## 2022-05-13 MED ORDER — GABAPENTIN 300 MG PO CAPS
ORAL_CAPSULE | ORAL | Status: AC
  Filled 2022-05-13: qty 1

## 2022-05-13 MED ORDER — BUPIVACAINE LIPOSOME 1.3 % IJ SUSP
INTRAMUSCULAR | Status: DC | PRN
  Administered 2022-05-13: 20 mL

## 2022-05-13 MED ORDER — OXYCODONE HCL 5 MG PO TABS
ORAL_TABLET | ORAL | Status: AC
  Filled 2022-05-13: qty 2

## 2022-05-13 MED ORDER — PANTOPRAZOLE SODIUM 40 MG PO TBEC
DELAYED_RELEASE_TABLET | ORAL | Status: AC
  Filled 2022-05-13: qty 1

## 2022-05-13 MED ORDER — GABAPENTIN 100 MG PO CAPS
ORAL_CAPSULE | ORAL | Status: AC
  Filled 2022-05-13: qty 2

## 2022-05-13 MED ORDER — MENTHOL 3 MG MT LOZG: 1.0000 | LOZENGE | OROMUCOSAL | Status: DC | PRN

## 2022-05-13 MED ORDER — DEXAMETHASONE SODIUM PHOSPHATE 10 MG/ML IJ SOLN
INTRAMUSCULAR | Status: AC
  Filled 2022-05-13: qty 1

## 2022-05-13 MED ORDER — ONDANSETRON HCL 4 MG PO TABS: 4.0000 mg | ORAL_TABLET | Freq: Four times a day (QID) | ORAL | Status: DC | PRN

## 2022-05-13 MED ORDER — POVIDONE-IODINE 10 % EX SWAB: 2.0000 | Freq: Once | CUTANEOUS | Status: DC

## 2022-05-13 MED ORDER — ONDANSETRON HCL 4 MG/2ML IJ SOLN
INTRAMUSCULAR | Status: DC | PRN
  Administered 2022-05-13: 4 mg via INTRAVENOUS

## 2022-05-13 MED ORDER — PROPOFOL 10 MG/ML IV BOLUS
INTRAVENOUS | Status: AC
  Filled 2022-05-13: qty 20

## 2022-05-13 MED ORDER — ROCURONIUM BROMIDE 10 MG/ML (PF) SYRINGE
PREFILLED_SYRINGE | INTRAVENOUS | Status: DC | PRN
  Administered 2022-05-13: 50 mg via INTRAVENOUS

## 2022-05-13 MED ORDER — MIDAZOLAM HCL 2 MG/2ML IJ SOLN
INTRAMUSCULAR | Status: AC
  Filled 2022-05-13: qty 2

## 2022-05-13 MED ORDER — LIDOCAINE HCL (PF) 2 % IJ SOLN
INTRAMUSCULAR | Status: AC
  Filled 2022-05-13: qty 5

## 2022-05-13 MED ORDER — KETOROLAC TROMETHAMINE 30 MG/ML IJ SOLN
INTRAMUSCULAR | Status: AC
  Filled 2022-05-13: qty 1

## 2022-05-13 MED ORDER — PANTOPRAZOLE SODIUM 40 MG PO TBEC
40.0000 mg | DELAYED_RELEASE_TABLET | Freq: Every day | ORAL | Status: DC
  Administered 2022-05-13: 40 mg via ORAL

## 2022-05-13 MED ORDER — ONDANSETRON HCL 4 MG/2ML IJ SOLN
4.0000 mg | Freq: Four times a day (QID) | INTRAMUSCULAR | Status: DC | PRN
  Administered 2022-05-13: 4 mg via INTRAVENOUS

## 2022-05-13 MED ORDER — SODIUM CHLORIDE 0.9 % IV SOLN
2.0000 g | INTRAVENOUS | Status: AC
  Administered 2022-05-13: 2 g via INTRAVENOUS

## 2022-05-13 MED ORDER — HYDROMORPHONE HCL 1 MG/ML IJ SOLN
0.2500 mg | INTRAMUSCULAR | Status: DC | PRN
  Administered 2022-05-13 (×2): 0.25 mg via INTRAVENOUS
  Administered 2022-05-13 (×2): 0.5 mg via INTRAVENOUS

## 2022-05-13 MED ORDER — KETOROLAC TROMETHAMINE 30 MG/ML IJ SOLN
INTRAMUSCULAR | Status: DC | PRN
  Administered 2022-05-13: 30 mg via INTRAVENOUS

## 2022-05-13 MED ORDER — GABAPENTIN 100 MG PO CAPS
200.0000 mg | ORAL_CAPSULE | Freq: Two times a day (BID) | ORAL | Status: DC
  Administered 2022-05-13: 200 mg via ORAL

## 2022-05-13 MED ORDER — 0.9 % SODIUM CHLORIDE (POUR BTL) OPTIME
TOPICAL | Status: DC | PRN
  Administered 2022-05-13: 1000 mL

## 2022-05-13 MED ORDER — ROCURONIUM BROMIDE 10 MG/ML (PF) SYRINGE
PREFILLED_SYRINGE | INTRAVENOUS | Status: AC
  Filled 2022-05-13: qty 10

## 2022-05-13 MED ORDER — BUPIVACAINE HCL 0.25 % IJ SOLN
INTRAMUSCULAR | Status: DC | PRN
  Administered 2022-05-13: 20 mL

## 2022-05-13 MED ORDER — SUCCINYLCHOLINE CHLORIDE 200 MG/10ML IV SOSY
PREFILLED_SYRINGE | INTRAVENOUS | Status: DC | PRN
  Administered 2022-05-13: 140 mg via INTRAVENOUS

## 2022-05-13 MED ORDER — MIDAZOLAM HCL 2 MG/2ML IJ SOLN
INTRAMUSCULAR | Status: DC | PRN
  Administered 2022-05-13: 2 mg via INTRAVENOUS

## 2022-05-13 MED ORDER — OXYCODONE HCL 5 MG PO TABS
5.0000 mg | ORAL_TABLET | ORAL | Status: DC | PRN
  Administered 2022-05-13 – 2022-05-14 (×6): 10 mg via ORAL

## 2022-05-13 SURGICAL SUPPLY — 53 items
APL SKNCLS STERI-STRIP NONHPOA (GAUZE/BANDAGES/DRESSINGS) ×1
BARRIER ADHS 3X4 INTERCEED (GAUZE/BANDAGES/DRESSINGS) IMPLANT
BENZOIN TINCTURE PRP APPL 2/3 (GAUZE/BANDAGES/DRESSINGS) ×2 IMPLANT
BLADE EXTENDED COATED 6.5IN (ELECTRODE) IMPLANT
BRR ADH 4X3 ABS CNTRL BYND (GAUZE/BANDAGES/DRESSINGS)
CELLS DAT CNTRL 66122 CELL SVR (MISCELLANEOUS) ×1 IMPLANT
DRAPE WARM FLUID 44X44 (DRAPES) ×2 IMPLANT
DRSG OPSITE POSTOP 4X10 (GAUZE/BANDAGES/DRESSINGS) IMPLANT
DURAPREP 26ML APPLICATOR (WOUND CARE) ×2 IMPLANT
GAUZE PAD ABD 8X10 STRL (GAUZE/BANDAGES/DRESSINGS) ×2 IMPLANT
GAUZE SPONGE 4X4 12PLY STRL (GAUZE/BANDAGES/DRESSINGS) IMPLANT
GLOVE BIO SURGEON STRL SZ7 (GLOVE) ×2 IMPLANT
GLOVE SURG SS PI 7.0 STRL IVOR (GLOVE) IMPLANT
GOWN STRL REUS W/TWL LRG LVL3 (GOWN DISPOSABLE) ×2 IMPLANT
HOLDER FOLEY CATH W/STRAP (MISCELLANEOUS) ×2 IMPLANT
KIT TURNOVER CYSTO (KITS) ×2 IMPLANT
LIGASURE IMPACT 36 18CM CVD LR (INSTRUMENTS) IMPLANT
MANIPULATOR UTERINE 4.5 ZUMI (MISCELLANEOUS) IMPLANT
NDL SAFETY ECLIP 18X1.5 (MISCELLANEOUS) IMPLANT
NDL SPNL 22GX3.5 QUINCKE BK (NEEDLE) ×2 IMPLANT
NEEDLE HYPO 22GX1.5 SAFETY (NEEDLE) IMPLANT
NEEDLE SPNL 22GX3.5 QUINCKE BK (NEEDLE) ×2 IMPLANT
NS IRRIG 500ML POUR BTL (IV SOLUTION) ×4 IMPLANT
PACK ABDOMINAL GYN (CUSTOM PROCEDURE TRAY) ×2 IMPLANT
PAD OB MATERNITY 4.3X12.25 (PERSONAL CARE ITEMS) ×2 IMPLANT
RETRACTOR WND ALEXIS 18 MED (MISCELLANEOUS) IMPLANT
RETRACTOR WND ALEXIS 25 LRG (MISCELLANEOUS) IMPLANT
RTRCTR WOUND ALEXIS 18CM MED (MISCELLANEOUS) ×1
RTRCTR WOUND ALEXIS 18CM SML (INSTRUMENTS)
RTRCTR WOUND ALEXIS 25CM LRG (MISCELLANEOUS)
SAVER CELL AAL HAEMONETICS (INSTRUMENTS) IMPLANT
SPIKE FLUID TRANSFER (MISCELLANEOUS) IMPLANT
SPONGE T-LAP 18X18 ~~LOC~~+RFID (SPONGE) ×2 IMPLANT
STAPLER VISISTAT 35W (STAPLE) IMPLANT
STRIP CLOSURE SKIN 1/2X4 (GAUZE/BANDAGES/DRESSINGS) ×2 IMPLANT
SUT MNCRL AB 4-0 PS2 18 (SUTURE) ×2 IMPLANT
SUT PDS AB 0 CTX 60 (SUTURE) ×2 IMPLANT
SUT VIC AB 0 CT1 18XCR BRD8 (SUTURE) IMPLANT
SUT VIC AB 0 CT1 27 (SUTURE) ×1
SUT VIC AB 0 CT1 27XBRD ANBCTR (SUTURE) ×2 IMPLANT
SUT VIC AB 0 CT1 8-18 (SUTURE)
SUT VIC AB 2-0 CT1 (SUTURE) IMPLANT
SUT VIC AB 2-0 CT1 27 (SUTURE)
SUT VIC AB 2-0 CT1 TAPERPNT 27 (SUTURE) IMPLANT
SUT VIC AB 3-0 CT1 27 (SUTURE)
SUT VIC AB 3-0 CT1 TAPERPNT 27 (SUTURE) IMPLANT
SUT VICRYL 0 TIES 12 18 (SUTURE) ×2 IMPLANT
SYR 10ML LL (SYRINGE) IMPLANT
SYR 50ML LL SCALE MARK (SYRINGE) IMPLANT
SYR CONTROL 10ML LL (SYRINGE) ×2 IMPLANT
TOWEL OR 17X26 10 PK STRL BLUE (TOWEL DISPOSABLE) ×2 IMPLANT
TRAY FOLEY W/BAG SLVR 14FR LF (SET/KITS/TRAYS/PACK) ×2 IMPLANT
WATER STERILE IRR 500ML POUR (IV SOLUTION) ×2 IMPLANT

## 2022-05-13 NOTE — Anesthesia Procedure Notes (Signed)
Procedure Name: Intubation Date/Time: 05/13/2022 7:36 AM  Performed by: Clearnce Sorrel, CRNAPre-anesthesia Checklist: Patient identified, Emergency Drugs available, Suction available and Patient being monitored Patient Re-evaluated:Patient Re-evaluated prior to induction Oxygen Delivery Method: Circle System Utilized Preoxygenation: Pre-oxygenation with 100% oxygen Induction Type: IV induction Ventilation: Mask ventilation without difficulty Laryngoscope Size: Glidescope and 3 (TMJ dislocation prevention) Grade View: Grade I Tube type: Oral Tube size: 7.0 mm Number of attempts: 1 Airway Equipment and Method: Stylet and Oral airway Placement Confirmation: ETT inserted through vocal cords under direct vision, positive ETCO2 and breath sounds checked- equal and bilateral Secured at: 22 cm Tube secured with: Tape Dental Injury: Teeth and Oropharynx as per pre-operative assessment

## 2022-05-13 NOTE — Transfer of Care (Signed)
Immediate Anesthesia Transfer of Care Note  Patient: Jenna Mcclure  Procedure(s) Performed: TOTAL ABDOMINAL HYSTERECTOMY WITH BILATERAL SALPINGECTOMY (Bilateral: Abdomen)  Patient Location: PACU  Anesthesia Type:General  Level of Consciousness: drowsy and patient cooperative  Airway & Oxygen Therapy: Patient Spontanous Breathing  Post-op Assessment: Report given to RN and Post -op Vital signs reviewed and stable  Post vital signs: Reviewed and stable  Last Vitals:  Vitals Value Taken Time  BP    Temp    Pulse    Resp    SpO2      Last Pain:  Vitals:   05/13/22 0602  TempSrc: Oral  PainSc: 0-No pain      Patients Stated Pain Goal: 5 (98/02/21 7981)  Complications: No notable events documented.

## 2022-05-13 NOTE — Anesthesia Preprocedure Evaluation (Addendum)
Anesthesia Evaluation  Patient identified by MRN, date of birth, ID band Patient awake    Reviewed: Allergy & Precautions, NPO status , Patient's Chart, lab work & pertinent test results  Airway Mallampati: II       Dental   Pulmonary    breath sounds clear to auscultation       Cardiovascular negative cardio ROS  Rhythm:Regular Rate:Normal     Neuro/Psych  PSYCHIATRIC DISORDERS       Neuromuscular disease    GI/Hepatic Neg liver ROS,GERD  ,,  Endo/Other  negative endocrine ROS    Renal/GU negative Renal ROS     Musculoskeletal   Abdominal   Peds  Hematology   Anesthesia Other Findings   Reproductive/Obstetrics                             Anesthesia Physical Anesthesia Plan  ASA: 3  Anesthesia Plan: General   Post-op Pain Management: Tylenol PO (pre-op)*   Induction: Intravenous  PONV Risk Score and Plan: 3 and Ondansetron, Dexamethasone, Midazolam and Treatment may vary due to age or medical condition  Airway Management Planned: Oral ETT  Additional Equipment:   Intra-op Plan:   Post-operative Plan: Extubation in OR  Informed Consent: I have reviewed the patients History and Physical, chart, labs and discussed the procedure including the risks, benefits and alternatives for the proposed anesthesia with the patient or authorized representative who has indicated his/her understanding and acceptance.     Dental advisory given  Plan Discussed with: CRNA and Anesthesiologist  Anesthesia Plan Comments:        Anesthesia Quick Evaluation

## 2022-05-13 NOTE — Progress Notes (Signed)
Examined preop with no change in status

## 2022-05-13 NOTE — Anesthesia Postprocedure Evaluation (Signed)
Anesthesia Post Note  Patient: Jenna Mcclure  Procedure(s) Performed: TOTAL ABDOMINAL HYSTERECTOMY WITH BILATERAL SALPINGECTOMY (Bilateral: Abdomen)     Patient location during evaluation: PACU Anesthesia Type: General Level of consciousness: awake Pain management: pain level controlled Vital Signs Assessment: post-procedure vital signs reviewed and stable Respiratory status: spontaneous breathing Cardiovascular status: stable Postop Assessment: no apparent nausea or vomiting Anesthetic complications: no   No notable events documented.  Last Vitals:  Vitals:   05/13/22 1130 05/13/22 1230  BP: 136/78 (!) 141/89  Pulse: 78 83  Resp: 14 14  Temp:    SpO2: 95% 97%    Last Pain:  Vitals:   05/13/22 1045  TempSrc:   PainSc: 7                  [place] Yarbro

## 2022-05-13 NOTE — Op Note (Signed)
Preoperative diagnosis: Pelvic pain secondary to degenerating leiomyoma  Postoperative diagnosis: Same  Procedure: TAH, bilateral salpingectomy  Surgeon::  Assistant: Linda Hedges  EBL: 100 cc  Procedure and findings: Tacky patient taken to the operating room after an adequate level general anesthesia was obtained with the patient's spine and the vagina was prepped separately and then the abdomen was prepped.  This was done after appropriate timeouts were taken.  Foley catheter was position preop.  A transverse Pfannenstiel incision was made to pack the spine sepsis carried down the fascia which was incised and extended transversely.  Rectus muscles divided in the midline, peritoneum entered separately without incident and extended in vertical fashion.  Alexis retractor was positioned, bleeding patient placed in Trendelenburg and the bowel was packed superiorly out of the field.  The uterus itself was symmetrically enlarged 10 to 11 weeks size bilateral adnexa unremarkable.  Long Kelly clamp to be placed due to the ovarian pedicle starting on the left, the LigaSure device was then used to coagulate and divide the round ligament on the right side peritoneum carried around to the midline.  The left utero-ovarian pedicle was then isolated, clamped divided first free tie followed by Citrobacter 0 Vicryl.  This was hemostatic, the LigaSure was then used to clamp coagulate and divide the left to removing it.  The exact same repeated on the opposite side removing both tubes concerning both ovaries.  Once peritoneum was carried around to the midline, this was advanced inferiorly with sharp and blunt dissection until it is below the cervicovaginal junction.  Using the LigaSure device in sequential manner of the ascending branch of the uterine artery, presenting probably due to vascular pedicle followed by uterosacral ligament was clamped, coagulated and divided.  Curved Heaney clamp was then used to clamp  the cervix and vaginal portion of the specimen was excised these pedicles were transfixed with 0 Vicryl sutures.  Small central area of the vaginal cuff was then closed with an interrupted 0 Vicryl.  The pelvis was interrogated with saline aspirated and the entire operative site was inspected and noted be hemostatic.  Prior to closure sponge, needle, instrument counts reported correct x 2.  Peritoneum closed with a running 2-0 Vicryl sutures  The rectus muscle midline.  W preserve the previous PDS was then used to close the fascia transversely.  Diluted Exparel solution was then injected subfascially and subcutaneously for postoperative analgesia.  Subcutaneous tissue was irrigated and observed noted to be hemostatic was not closed separately.  4 Monocryl suture used on the skin for subcuticular closure of left benzoin and Steri-Strips.  Clear grandmother within the case.  She tolerated this well and went to recovery room in good condition.  Dictated with Dragon medical   Nira Retort MD

## 2022-05-14 ENCOUNTER — Encounter (HOSPITAL_BASED_OUTPATIENT_CLINIC_OR_DEPARTMENT_OTHER): Payer: Self-pay | Admitting: Obstetrics and Gynecology

## 2022-05-14 DIAGNOSIS — R102 Pelvic and perineal pain: Secondary | ICD-10-CM | POA: Diagnosis not present

## 2022-05-14 DIAGNOSIS — K219 Gastro-esophageal reflux disease without esophagitis: Secondary | ICD-10-CM | POA: Diagnosis not present

## 2022-05-14 DIAGNOSIS — N8003 Adenomyosis of the uterus: Secondary | ICD-10-CM | POA: Diagnosis not present

## 2022-05-14 DIAGNOSIS — D251 Intramural leiomyoma of uterus: Secondary | ICD-10-CM | POA: Diagnosis not present

## 2022-05-14 DIAGNOSIS — D219 Benign neoplasm of connective and other soft tissue, unspecified: Secondary | ICD-10-CM | POA: Diagnosis not present

## 2022-05-14 LAB — CBC
HCT: 31.1 % — ABNORMAL LOW (ref 36.0–46.0)
Hemoglobin: 9.7 g/dL — ABNORMAL LOW (ref 12.0–15.0)
MCH: 26.9 pg (ref 26.0–34.0)
MCHC: 31.2 g/dL (ref 30.0–36.0)
MCV: 86.1 fL (ref 80.0–100.0)
Platelets: 259 10*3/uL (ref 150–400)
RBC: 3.61 MIL/uL — ABNORMAL LOW (ref 3.87–5.11)
RDW: 16.7 % — ABNORMAL HIGH (ref 11.5–15.5)
WBC: 13.3 10*3/uL — ABNORMAL HIGH (ref 4.0–10.5)
nRBC: 0 % (ref 0.0–0.2)

## 2022-05-14 LAB — SURGICAL PATHOLOGY

## 2022-05-14 MED ORDER — OXYCODONE HCL 5 MG PO TABS
ORAL_TABLET | ORAL | Status: AC
  Filled 2022-05-14: qty 2

## 2022-05-14 MED ORDER — IBUPROFEN 200 MG PO TABS: 800.0000 mg | ORAL_TABLET | Freq: Three times a day (TID) | ORAL | 2 refills | Status: AC | PRN

## 2022-05-14 MED ORDER — KETOROLAC TROMETHAMINE 30 MG/ML IJ SOLN
INTRAMUSCULAR | Status: AC
  Filled 2022-05-14: qty 1

## 2022-05-14 MED ORDER — MORPHINE SULFATE (PF) 2 MG/ML IV SOLN
INTRAVENOUS | Status: AC
  Filled 2022-05-14: qty 1

## 2022-05-14 NOTE — Discharge Summary (Signed)
Physician Discharge Summary  Patient ID: Jenna Mcclure MRN: [id number] DOB/AGE: [birth year] 41 y.o.  Admit date: 05/13/2022 Discharge date: 05/14/2022  Admission Diagnoses:  Discharge Diagnoses:  Principal Problem:   Leiomyoma   Discharged Condition: good  Hospital Course: adm for TAH/BS for symptyomatic leuimyoma, was D/C POD 1>>afeb, ambulating and tol PO  Consults: None  Significant Diagnostic Studies: labs:  Results for orders placed or performed during the hospital encounter of 05/13/22 (from the past 48 hour(s))  Pregnancy, urine POC     Status: None   Collection Time: 05/13/22  5:49 AM  Result Value Ref Range   Preg Test, Ur NEGATIVE NEGATIVE    Comment:        THE SENSITIVITY OF THIS METHODOLOGY IS >24 mIU/mL   CBC     Status: Abnormal   Collection Time: 05/14/22  6:18 AM  Result Value Ref Range   WBC 13.3 (H) 4.0 - 10.5 K/uL   RBC 3.61 (L) 3.87 - 5.11 MIL/uL   Hemoglobin 9.7 (L) 12.0 - 15.0 g/dL   HCT 31.1 (L) 36.0 - 46.0 %   MCV 86.1 80.0 - 100.0 fL   MCH 26.9 26.0 - 34.0 pg   MCHC 31.2 30.0 - 36.0 g/dL   RDW 16.7 (H) 11.5 - 15.5 %   Platelets 259 150 - 400 K/uL   nRBC 0.0 0.0 - 0.2 %    Comment: Performed at [facility], [place] [address] [place], [place] [address]     Treatments: surgery: TAH/BS  Discharge Exam: Blood pressure 125/72, pulse 77, temperature 98.1 F (36.7 C), resp. rate 16, height 5' 2.5" (1.588 m), weight 77.4 kg, last menstrual period 05/08/2022, SpO2 98 %. Inc C/D, abd soft nontender  Disposition: Discharge disposition: 01-Home or Self Care          Follow-up Information     Molli Posey, MD. Schedule an appointment as soon as possible for a visit in 1 week(s).   Specialty: Obstetrics and Gynecology Contact information: [address] [place] [address] [phone]                 Signed: Margarette Asal 05/14/2022, 9:06 AM

## 2022-07-31 ENCOUNTER — Other Ambulatory Visit: Payer: Self-pay | Admitting: Obstetrics and Gynecology

## 2022-07-31 DIAGNOSIS — Z1231 Encounter for screening mammogram for malignant neoplasm of breast: Secondary | ICD-10-CM

## 2022-08-01 DIAGNOSIS — Z01419 Encounter for gynecological examination (general) (routine) without abnormal findings: Secondary | ICD-10-CM | POA: Diagnosis not present

## 2022-08-01 DIAGNOSIS — Z6831 Body mass index (BMI) 31.0-31.9, adult: Secondary | ICD-10-CM | POA: Diagnosis not present

## 2022-09-25 ENCOUNTER — Ambulatory Visit
Admission: RE | Admit: 2022-09-25 | Discharge: 2022-09-25 | Disposition: A | Discharge status: Home / Self Care | Payer: BC Managed Care – PPO | Source: Ambulatory Visit | Attending: Obstetrics and Gynecology | Admitting: Obstetrics and Gynecology

## 2022-09-25 DIAGNOSIS — Z1231 Encounter for screening mammogram for malignant neoplasm of breast: Secondary | ICD-10-CM | POA: Diagnosis not present

## 2022-10-17 DIAGNOSIS — Z113 Encounter for screening for infections with a predominantly sexual mode of transmission: Secondary | ICD-10-CM | POA: Diagnosis not present

## 2022-10-17 DIAGNOSIS — N898 Other specified noninflammatory disorders of vagina: Secondary | ICD-10-CM | POA: Diagnosis not present

## 2022-10-17 DIAGNOSIS — N760 Acute vaginitis: Secondary | ICD-10-CM | POA: Diagnosis not present

## 2022-10-17 DIAGNOSIS — N390 Urinary tract infection, site not specified: Secondary | ICD-10-CM | POA: Diagnosis not present

## 2022-12-20 ENCOUNTER — Emergency Department (HOSPITAL_BASED_OUTPATIENT_CLINIC_OR_DEPARTMENT_OTHER): Payer: BC Managed Care – PPO

## 2022-12-20 ENCOUNTER — Other Ambulatory Visit: Payer: Self-pay

## 2022-12-20 ENCOUNTER — Emergency Department (HOSPITAL_BASED_OUTPATIENT_CLINIC_OR_DEPARTMENT_OTHER)
Admission: EM | Admit: 2022-12-20 | Discharge: 2022-12-20 | Disposition: A | Discharge status: Home / Self Care | Payer: BC Managed Care – PPO | Attending: Emergency Medicine | Admitting: Emergency Medicine

## 2022-12-20 ENCOUNTER — Encounter (HOSPITAL_BASED_OUTPATIENT_CLINIC_OR_DEPARTMENT_OTHER): Payer: Self-pay

## 2022-12-20 DIAGNOSIS — R079 Chest pain, unspecified: Secondary | ICD-10-CM | POA: Diagnosis not present

## 2022-12-20 DIAGNOSIS — R9389 Abnormal findings on diagnostic imaging of other specified body structures: Secondary | ICD-10-CM | POA: Diagnosis not present

## 2022-12-20 DIAGNOSIS — R1011 Right upper quadrant pain: Secondary | ICD-10-CM | POA: Diagnosis not present

## 2022-12-20 DIAGNOSIS — R0789 Other chest pain: Secondary | ICD-10-CM | POA: Diagnosis not present

## 2022-12-20 DIAGNOSIS — R918 Other nonspecific abnormal finding of lung field: Secondary | ICD-10-CM | POA: Diagnosis not present

## 2022-12-20 LAB — BASIC METABOLIC PANEL
Anion gap: 7 (ref 5–15)
BUN: 9 mg/dL (ref 6–20)
CO2: 21 mmol/L — ABNORMAL LOW (ref 22–32)
Calcium: 8.9 mg/dL (ref 8.9–10.3)
Chloride: 102 mmol/L (ref 98–111)
Creatinine, Ser: 0.8 mg/dL (ref 0.44–1.00)
GFR, Estimated: >60 mL/min (ref >60)
Glucose, Bld: 96 mg/dL (ref 70–99)
Potassium: 3.4 mmol/L — ABNORMAL LOW (ref 3.5–5.1)
Sodium: 130 mmol/L — ABNORMAL LOW (ref 135–145)

## 2022-12-20 LAB — CBC
HCT: 41.9 % (ref 36.0–46.0)
Hemoglobin: 14.1 g/dL (ref 12.0–15.0)
MCH: 29 pg (ref 26.0–34.0)
MCHC: 33.7 g/dL (ref 30.0–36.0)
MCV: 86.2 fL (ref 80.0–100.0)
Platelets: 313 10*3/uL (ref 150–400)
RBC: 4.86 MIL/uL (ref 3.87–5.11)
RDW: 14.8 % (ref 11.5–15.5)
WBC: 7.9 10*3/uL (ref 4.0–10.5)
nRBC: 0 % (ref 0.0–0.2)

## 2022-12-20 LAB — LIPASE, BLOOD: Lipase: 28 U/L (ref 11–51)

## 2022-12-20 LAB — HEPATIC FUNCTION PANEL
ALT: 14 U/L (ref 0–44)
AST: 18 U/L (ref 15–41)
Albumin: 4.3 g/dL (ref 3.5–5.0)
Alkaline Phosphatase: 57 U/L (ref 38–126)
Bilirubin, Direct: <0.1 mg/dL (ref 0.0–0.2)
Total Bilirubin: 0.4 mg/dL (ref 0.3–1.2)
Total Protein: 8.2 g/dL — ABNORMAL HIGH (ref 6.5–8.1)

## 2022-12-20 LAB — TROPONIN I (HIGH SENSITIVITY)
Troponin I (High Sensitivity): <2 ng/L (ref <18)
Troponin I (High Sensitivity): <2 ng/L (ref <18)

## 2022-12-20 LAB — D-DIMER, QUANTITATIVE: D-Dimer, Quant: 0.89 ug/mL-FEU — ABNORMAL HIGH (ref 0.00–0.50)

## 2022-12-20 MED ORDER — IOHEXOL 350 MG/ML SOLN
75.0000 mL | Freq: Once | INTRAVENOUS | Status: AC | PRN
  Administered 2022-12-20: 75 mL via INTRAVENOUS

## 2022-12-20 MED ORDER — LIDOCAINE VISCOUS HCL 2 % MT SOLN
15.0000 mL | Freq: Once | OROMUCOSAL | Status: AC
  Administered 2022-12-20: 15 mL via ORAL

## 2022-12-20 MED ORDER — FAMOTIDINE 40 MG PO TABS: 40.0000 mg | ORAL_TABLET | ORAL | 0 refills | Status: DC | PRN

## 2022-12-20 MED ORDER — PANTOPRAZOLE SODIUM 20 MG PO TBEC: 20.0000 mg | DELAYED_RELEASE_TABLET | Freq: Every day | ORAL | 0 refills | Status: DC

## 2022-12-20 MED ORDER — METHOCARBAMOL 500 MG PO TABS: 500.0000 mg | ORAL_TABLET | Freq: Two times a day (BID) | ORAL | 0 refills | Status: AC

## 2022-12-20 MED ORDER — ALUM & MAG HYDROXIDE-SIMETH 200-200-20 MG/5ML PO SUSP
30.0000 mL | Freq: Once | ORAL | Status: AC
  Administered 2022-12-20: 30 mL via ORAL

## 2022-12-20 MED ORDER — SODIUM CHLORIDE 0.9 % IV BOLUS
1000.0000 mL | Freq: Once | INTRAVENOUS | Status: AC
  Administered 2022-12-20: 1000 mL via INTRAVENOUS

## 2022-12-20 MED ORDER — FAMOTIDINE IN NACL 20-0.9 MG/50ML-% IV SOLN
20.0000 mg | Freq: Once | INTRAVENOUS | Status: AC
  Administered 2022-12-20: 20 mg via INTRAVENOUS

## 2022-12-20 NOTE — ED Triage Notes (Signed)
Patient having chest pain since yesterday and feels like she can't get a deep breath. She denied fever and cough.

## 2022-12-20 NOTE — Discharge Instructions (Signed)
You have been seen today for your complaint of chest pain. Your lab work  was reassuring. Your imaging showed a small incidental nodule in your lung which does not require follow up. Imaging was otherwise reassuring. Your discharge medications include pepcid and protonix.  These medicines used to help with your heartburn.  Use Pepcid as needed.  Use Protonix once daily. Robaxin. This is a muscle relaxer. It may cause drowsiness. Do not drive, operate heavy machinery or make important decisions when taking this medication. Only take it at night until you know how it affects you. Only take it as needed and take other medications such as ibuprofen or tylenol prior to trying this medication. Follow up with: Cardiology.  They should call you to schedule appointment for follow-up visit. You should also follow-up with your primary care provider within the next week for reevaluation Please seek immediate medical care if you develop any of the following symptoms: Your chest pain gets worse. You have a cough that gets worse, or you cough up blood. You have severe pain in your abdomen. You faint. You have sudden, unexplained chest discomfort. You have sudden, unexplained discomfort in your arms, back, neck, or jaw. You have shortness of breath at any time. You suddenly start to sweat, or your skin gets clammy. You feel nausea or you vomit. You suddenly feel lightheaded or dizzy. You have severe weakness, or unexplained weakness or fatigue. Your heart begins to beat quickly, or it feels like it is skipping beats. At this time there does not appear to be the presence of an emergent medical condition, however there is always the potential for conditions to change. Please read and follow the below instructions.  Do not take your medicine if  develop an itchy rash, swelling in your mouth or lips, or difficulty breathing; call 911 and seek immediate emergency medical attention if this occurs.  You may review  your lab tests and imaging results in their entirety on your MyChart account.  Please discuss all results of fully with your primary care provider and other specialist at your follow-up visit.  Note: Portions of this text may have been transcribed using voice recognition software. Every effort was made to ensure accuracy; however, inadvertent computerized transcription errors may still be present.

## 2022-12-20 NOTE — ED Notes (Signed)
   12/20/22 1404  Respiratory Assessment  $ RT Protocol Assessment  Yes  Assessment Type Assess only  Respiratory Pattern Regular;Symmetrical  Chest Assessment Chest expansion symmetrical  Bilateral Breath Sounds Clear  Oxygen Therapy/Pulse Ox  O2 Therapy Room air  SpO2 100 %   BBS CTA, speaking in complete sentences with normal resp rate.  When assessment of BBS patient took maximal inhalation with a catch or pause during breath.

## 2022-12-20 NOTE — ED Notes (Signed)
Pt in bed, pt states that she is feeling better, states that she is ready to go home, pt verbalized understanding d/c and follow up, reviewed scripts, pt ambulatory from department.

## 2022-12-20 NOTE — ED Provider Notes (Signed)
[place] [facility] AT [facility] Provider Note   CSN: [id number] Arrival date & time: 12/20/22  1359     History  Chief Complaint  Patient presents with   Chest Pain    Jenna Mcclure is a 42 y.o. female.  With history of GERD, anxiety, depression, anemia who presents to the ED for evaluation of chest pain.  This began yesterday afternoon.  Localizes this pain mostly to the substernal region with some radiation to the right lower chest, under her breast.  This is worse with deep inspiration.  She states it feels similar to episodes of GERD she has had in the past.  She has been out of her home Pepcid for a few months because she has not needed it.  She tried Rolaids without improvement in her symptoms.  She describes the pain as a sharp sensation.  Pain is not exertional.  No associated nausea, vomiting or diaphoresis.  She denies cardiac history.  She denies any shortness of breath, cough, hemoptysis, unilateral leg swelling, history of DVT or PE, hormone use.   Chest Pain      Home Medications Prior to Admission medications   Medication Sig Start Date End Date Taking? Authorizing Provider  methocarbamol (ROBAXIN) 500 MG tablet Take 1 tablet (500 mg total) by mouth 2 (two) times daily for 5 days. 12/20/22 12/25/22 Yes Hanna Ra, Edsel Petrin, PA-C  pantoprazole (PROTONIX) 20 MG tablet Take 1 tablet (20 mg total) by mouth daily. 12/20/22  Yes Valli Randol, Edsel Petrin, PA-C  Cholecalciferol (VITAMIN D) 2000 UNITS CAPS Take 2,000 Units by mouth daily.    [provider]  famotidine (PEPCID) 40 MG tablet Take 1 tablet (40 mg total) by mouth as needed for heartburn or indigestion. 12/20/22   Romulus Hanrahan, Edsel Petrin, PA-C  ferrous sulfate 325 (65 FE) MG EC tablet Take 325 mg by mouth daily.    [provider]  ibuprofen (ADVIL) 200 MG tablet Take 4 tablets (800 mg total) by mouth every 8 (eight) hours as needed. 05/14/22   Richarda Overlie, MD  Multiple Vitamin  (MULTIVITAMIN WITH MINERALS) TABS tablet Take 1 tablet by mouth daily.    [provider]  oxyCODONE-acetaminophen (PERCOCET/ROXICET) 5-325 MG tablet Take 1 tablet by mouth every 6 (six) hours as needed for severe pain. 02/02/22   Linwood Dibbles, MD  PROGESTERONE PO Take by mouth as needed.    [provider]  Norgestim-Eth Charlott Holler Triphasic (ORTHO TRI-CYCLEN LO PO) Take 1 tablet by mouth daily.  08/20/11  [provider]      Allergies    Miconazole nitrate    Review of Systems   Review of Systems  Cardiovascular:  Positive for chest pain.  All other systems reviewed and are negative.   Physical Exam Updated Vital Signs BP (!) 145/95   Pulse 83   Temp 98.1 F (36.7 C)   Resp 18   Ht 5\' 2"  (1.575 m)   Wt 79.4 kg   LMP 05/08/2022 (Approximate)   SpO2 96%   BMI 32.01 kg/m  Physical Exam Vitals and nursing note reviewed.  Constitutional:      General: She is not in acute distress.    Appearance: She is well-developed.     Comments: Resting comfortably in bed  HENT:     Head: Normocephalic and atraumatic.  Eyes:     Conjunctiva/sclera: Conjunctivae normal.  Cardiovascular:     Rate and Rhythm: Normal rate and regular rhythm.  Heart sounds: No murmur heard. Pulmonary:     Effort: Pulmonary effort is normal. No respiratory distress.     Breath sounds: Normal breath sounds. No decreased breath sounds, wheezing, rhonchi or rales.  Abdominal:     Palpations: Abdomen is soft.     Tenderness: There is no abdominal tenderness.     Comments: Negative Murphy sign  Musculoskeletal:        General: No swelling.     Cervical back: Neck supple.     Right lower leg: No edema.     Left lower leg: No edema.  Skin:    General: Skin is warm and dry.     Capillary Refill: Capillary refill takes less than 2 seconds.  Neurological:     General: No focal deficit present.     Mental Status: She is alert and oriented to person, place, and time.  Psychiatric:         Mood and Affect: Mood normal.     ED Results / Procedures / Treatments   Labs (all labs ordered are listed, but only abnormal results are displayed) Labs Reviewed  BASIC METABOLIC PANEL - Abnormal; Notable for the following components:      Result Value   Sodium 130 (*)    Potassium 3.4 (*)    CO2 21 (*)    All other components within normal limits  HEPATIC FUNCTION PANEL - Abnormal; Notable for the following components:   Total Protein 8.2 (*)    All other components within normal limits  D-DIMER, QUANTITATIVE - Abnormal; Notable for the following components:   D-Dimer, Quant 0.89 (*)    All other components within normal limits  CBC  LIPASE, BLOOD  TROPONIN I (HIGH SENSITIVITY)  TROPONIN I (HIGH SENSITIVITY)    EKG None  Radiology US Abdomen Limited RUQ (LIVER/GB)  Result Date: 12/20/2022 CLINICAL DATA:  Right upper quadrant pain EXAM: ULTRASOUND ABDOMEN LIMITED RIGHT UPPER QUADRANT COMPARISON:  CT abdomen and pelvis 02/02/2022 FINDINGS: Gallbladder: No gallstones or wall thickening visualized. No sonographic Murphy sign noted by sonographer. Common bile duct: Diameter: 1.8 mm Liver: No focal lesion identified. Within normal limits in parenchymal echogenicity. Portal vein is patent on color Doppler imaging with normal direction of blood flow towards the liver. Other: None. IMPRESSION: Normal right upper quadrant ultrasound. Electronically Signed   By: Darliss Cheney M.D.   On: 12/20/2022 18:26   CT Angio Chest PE W/Cm &/Or Wo Cm  Result Date: 12/20/2022 CLINICAL DATA:  Chest pain. EXAM: CT ANGIOGRAPHY CHEST WITH CONTRAST TECHNIQUE: Multidetector CT imaging of the chest was performed using the standard protocol during bolus administration of intravenous contrast. Multiplanar CT image reconstructions and MIPs were obtained to evaluate the vascular anatomy. RADIATION DOSE REDUCTION: This exam was performed according to the departmental dose-optimization program which includes  automated exposure control, adjustment of the mA and/or kV according to patient size and/or use of iterative reconstruction technique. CONTRAST:  75mL OMNIPAQUE IOHEXOL 350 MG/ML SOLN COMPARISON:  None Available. FINDINGS: Cardiovascular: The thoracic aorta is normal in appearance. Satisfactory opacification of the pulmonary arteries to the segmental level. No evidence of pulmonary embolism. Normal heart size. No pericardial effusion. Mediastinum/Nodes: No enlarged mediastinal, hilar, or axillary lymph nodes. Thyroid gland, trachea, and esophagus demonstrate no significant findings. Lungs/Pleura: A 3 mm noncalcified lung nodule is seen within the posterior aspect of the right lower lobe (axial CT image 33, CT series 5). There is no evidence of an acute infiltrate, pleural effusion or pneumothorax.  Upper Abdomen: No acute abnormality. Musculoskeletal: A chronic appearing area of linear sclerosis is seen extending through the body of the T9 vertebral body. No acute osseous abnormalities are identified. Review of the MIP images confirms the above findings. IMPRESSION: 1. No evidence of pulmonary embolism or acute cardiopulmonary disease. 2. 3 mm noncalcified right lower lobe lung nodule. No follow-up needed if patient is low-risk.This recommendation follows the consensus statement: Guidelines for Management of Incidental Pulmonary Nodules Detected on CT Images: From the Fleischner Society 2017; Radiology 2017; 284:228-243. Electronically Signed   By: Aram Candela M.D.   On: 12/20/2022 18:01   DG Chest 2 View  Result Date: 12/20/2022 CLINICAL DATA:  Chest pain. EXAM: CHEST - 2 VIEW COMPARISON:  09/17/2014. FINDINGS: Clear lungs. Normal heart size and mediastinal contours. No pleural effusion or pneumothorax. Visualized bones and upper abdomen are unremarkable. IMPRESSION: No evidence of acute cardiopulmonary disease. Electronically Signed   By: Orvan Falconer M.D.   On: 12/20/2022 14:25     Procedures Procedures    Medications Ordered in ED Medications  alum & mag hydroxide-simeth (MAALOX/MYLANTA) 200-200-20 MG/5ML suspension 30 mL (30 mLs Oral Given 12/20/22 1627)    And  lidocaine (XYLOCAINE) 2 % viscous mouth solution 15 mL (15 mLs Oral Given 12/20/22 1628)  famotidine (PEPCID) IVPB 20 mg premix (20 mg Intravenous New Bag/Given 12/20/22 1634)  sodium chloride 0.9 % bolus 1,000 mL (1,000 mLs Intravenous New Bag/Given 12/20/22 1632)  iohexol (OMNIPAQUE) 350 MG/ML injection 75 mL (75 mLs Intravenous Contrast Given 12/20/22 1739)    ED Course/ Medical Decision Making/ A&P                             Medical Decision Making Amount and/or Complexity of Data Reviewed Labs: ordered. Radiology: ordered.  Risk OTC drugs. Prescription drug management.  This patient presents to the ED for concern of chest pain, this involves an extensive number of treatment options, and is a complaint that carries with it a high risk of complications and morbidity. The emergent differential diagnosis of chest pain includes: Acute coronary syndrome, pericarditis, aortic dissection, pulmonary embolism, tension pneumothorax, and esophageal rupture.  I do not believe the patient has an emergent cause of chest pain, other urgent/non-acute considerations include, but are not limited to: chronic angina, aortic stenosis, cardiomyopathy, myocarditis, mitral valve prolapse, pulmonary hypertension, hypertrophic obstructive cardiomyopathy (HOCM), aortic insufficiency, right ventricular hypertrophy, pneumonia, pleuritis, bronchitis, pneumothorax, tumor, gastroesophageal reflux disease (GERD), esophageal spasm, Mallory-Weiss syndrome, peptic ulcer disease, biliary disease, pancreatitis, functional gastrointestinal pain, cervical or thoracic disk disease or arthritis, shoulder arthritis, costochondritis, subacromial bursitis, anxiety or panic attack, herpes zoster, breast disorders, chest wall tumors, thoracic  outlet syndrome, mediastinitis.   Co morbidities that complicate the patient evaluation  GERD, anxiety, depression, anemia  My initial workup includes labs, imaging, EKG, symptom control  Additional history obtained from: Nursing notes from this visit.  I ordered, reviewed and interpreted labs which include: CMP, CBC, lipase, troponin, delta troponin, D-dimer.  Initial and delta troponin negative.  D-dimer elevated at 0.89.  CMP with hyponatremia of 130, hypokalemia 3.4, hypochloremia of 21  I ordered imaging studies including chest x-ray, ultrasound right upper quadrant, CT PE study I independently visualized and interpreted imaging which showed negative chest x-ray, I agree with the radiologist interpretation  Cardiac Monitoring:  The patient was maintained on a cardiac monitor.  I personally viewed and interpreted the cardiac monitored which showed an underlying rhythm  of: NSR  Afebrile, hemodynamically stable.  42 year old female presenting to the ED for evaluation of chest pain.  This is localized to the substernal region with some radiation to the right lower chest/right upper quadrant of the abdomen.  This is worse with deep inspiration.  She states it feels like her typical GERD.  She ran out of her Pepcid at home because she has not needed it lately.  She denies any shortness of breath.  She is otherwise low risk for PE so a D-dimer was ordered.  This was positive so a CT PE study was ordered.  This did show an incidental small nodule in the right lower lobe of her lung but no PE.  She does not smoke.  No family history of lung cancer.  No exposure to chemicals.  No follow-up imaging is necessary per radiology.  She was notified of this incidental finding.  Due to right upper quadrant pain with deep inspiration, hepatic function panel and right upper quadrant ultrasound were ordered.  These were both negative.  Low suspicion for cholecystitis as the cause of her pain.  Her initial and  delta troponin are negative.  EKG is without ischemic changes.  Chest x-ray negative.  Overall I have low suspicion for ACS as the cause of her symptoms.  Symptoms likely are secondary to dyspepsia, however would be an atypical presentation.  She does not follow with a cardiologist.  Cardiology referral was placed.  She was given prescriptions for Protonix and Pepcid as well as Robaxin for her pain.  She was encouraged to follow-up with her primary care provider as well for reevaluation of her symptoms.  She was given return precautions.  Stable at discharge.  At this time there does not appear to be any evidence of an acute emergency medical condition and the patient appears stable for discharge with appropriate outpatient follow up. Diagnosis was discussed with patient who verbalizes understanding of care plan and is agreeable to discharge. I have discussed return precautions with patient who verbalizes understanding. Patient encouraged to follow-up with their PCP within 1 week. All questions answered.  Note: Portions of this report may have been transcribed using voice recognition software. Every effort was made to ensure accuracy; however, inadvertent computerized transcription errors may still be present.        Final Clinical Impression(s) / ED Diagnoses Final diagnoses:  Atypical chest pain  Abnormal finding of diagnostic imaging    Rx / DC Orders ED Discharge Orders          Ordered    famotidine (PEPCID) 40 MG tablet  As needed        12/20/22 1836    pantoprazole (PROTONIX) 20 MG tablet  Daily        12/20/22 1836    methocarbamol (ROBAXIN) 500 MG tablet  2 times daily        12/20/22 1836    Ambulatory referral to Cardiology       Comments: If you have not heard from the Cardiology office within the next 72 hours please call [phone].   12/20/22 1837              Michelle Piper, PA-C 12/20/22 1845    Lonell Grandchild, MD 12/21/22 1153

## 2023-01-17 DIAGNOSIS — N898 Other specified noninflammatory disorders of vagina: Secondary | ICD-10-CM | POA: Diagnosis not present

## 2023-01-17 DIAGNOSIS — R319 Hematuria, unspecified: Secondary | ICD-10-CM | POA: Diagnosis not present

## 2023-01-17 DIAGNOSIS — F419 Anxiety disorder, unspecified: Secondary | ICD-10-CM | POA: Diagnosis not present

## 2023-01-17 DIAGNOSIS — Z113 Encounter for screening for infections with a predominantly sexual mode of transmission: Secondary | ICD-10-CM | POA: Diagnosis not present

## 2023-01-17 DIAGNOSIS — B3731 Acute candidiasis of vulva and vagina: Secondary | ICD-10-CM | POA: Diagnosis not present

## 2023-02-25 NOTE — Progress Notes (Deleted)
Cardiology Office Note:  .   Date:  02/25/2023  ID:  Bertram Millard, DOB Nov 17, 1980, MRN [id number] PCP: Jarrett Soho, PA-C  [facility] Providers Cardiologist:  None { Click to update primary MD,subspecialty MD or APP then REFRESH:1}   History of Present Illness: .   Jenna Mcclure is a 42 y.o. female with hx of GERD referral from the ED for CP  ROS: ***  Studies Reviewed: .        *** Risk Assessment/Calculations:   {Does this patient have ATRIAL FIBRILLATION?:[phone]} No BP recorded.  {Refresh Note OR Click here to enter BP  :1}***       Physical Exam:   VS:  LMP 05/08/2022 (Approximate)    Wt Readings from Last 3 Encounters:  12/20/22 175 lb (79.4 kg)  05/13/22 170 lb 11.2 oz (77.4 kg)  05/09/22 168 lb 6 oz (76.4 kg)    GEN: Well nourished, well developed in no acute distress NECK: No JVD; No carotid bruits CARDIAC: ***RRR, no murmurs, rubs, gallops RESPIRATORY:  Clear to auscultation without rales, wheezing or rhonchi  ABDOMEN: Soft, non-tender, non-distended EXTREMITIES:  No edema; No deformity   ASSESSMENT AND PLAN: .   CP - symptoms are not typical for obstructive CAD. She describes sharp pain. Her EKG does not show ischemia. No signs of pericarditis.  - CVD risk is low - recommend: continued management of GERD     {Are you ordering a CV Procedure (e.g. stress test, cath, DCCV, TEE, etc)?   Press F2        :[id number]}  Dispo: ***  Signed, Maisie Fus, MD

## 2023-02-26 ENCOUNTER — Ambulatory Visit: Payer: BC Managed Care – PPO | Admitting: Internal Medicine

## 2023-03-14 DIAGNOSIS — Z0000 Encounter for general adult medical examination without abnormal findings: Secondary | ICD-10-CM | POA: Diagnosis not present

## 2023-03-18 DIAGNOSIS — R3129 Other microscopic hematuria: Secondary | ICD-10-CM | POA: Diagnosis not present

## 2023-03-18 DIAGNOSIS — R7309 Other abnormal glucose: Secondary | ICD-10-CM | POA: Diagnosis not present

## 2023-03-18 DIAGNOSIS — Z13220 Encounter for screening for lipoid disorders: Secondary | ICD-10-CM | POA: Diagnosis not present

## 2023-03-18 DIAGNOSIS — R7989 Other specified abnormal findings of blood chemistry: Secondary | ICD-10-CM | POA: Diagnosis not present

## 2023-03-18 DIAGNOSIS — E559 Vitamin D deficiency, unspecified: Secondary | ICD-10-CM | POA: Diagnosis not present

## 2023-06-12 ENCOUNTER — Encounter (HOSPITAL_COMMUNITY): Payer: Self-pay | Admitting: Emergency Medicine

## 2023-06-12 ENCOUNTER — Other Ambulatory Visit: Payer: Self-pay

## 2023-06-12 ENCOUNTER — Emergency Department (HOSPITAL_COMMUNITY)
Admission: EM | Admit: 2023-06-12 | Discharge: 2023-06-13 | Disposition: A | Discharge status: Home / Self Care | Payer: BC Managed Care – PPO | Attending: Emergency Medicine | Admitting: Emergency Medicine

## 2023-06-12 ENCOUNTER — Emergency Department (HOSPITAL_COMMUNITY): Payer: BC Managed Care – PPO

## 2023-06-12 DIAGNOSIS — R002 Palpitations: Secondary | ICD-10-CM | POA: Insufficient documentation

## 2023-06-12 DIAGNOSIS — M62838 Other muscle spasm: Secondary | ICD-10-CM

## 2023-06-12 DIAGNOSIS — R252 Cramp and spasm: Secondary | ICD-10-CM | POA: Diagnosis not present

## 2023-06-12 DIAGNOSIS — I4891 Unspecified atrial fibrillation: Secondary | ICD-10-CM | POA: Diagnosis not present

## 2023-06-12 DIAGNOSIS — Z733 Stress, not elsewhere classified: Secondary | ICD-10-CM | POA: Diagnosis not present

## 2023-06-12 DIAGNOSIS — F439 Reaction to severe stress, unspecified: Secondary | ICD-10-CM

## 2023-06-12 DIAGNOSIS — F430 Acute stress reaction: Secondary | ICD-10-CM | POA: Diagnosis not present

## 2023-06-12 LAB — BASIC METABOLIC PANEL
Anion gap: 7 (ref 5–15)
BUN: 6 mg/dL (ref 6–20)
CO2: 24 mmol/L (ref 22–32)
Calcium: 9.2 mg/dL (ref 8.9–10.3)
Chloride: 106 mmol/L (ref 98–111)
Creatinine, Ser: 0.87 mg/dL (ref 0.44–1.00)
GFR, Estimated: >60 mL/min (ref >60)
Glucose, Bld: 131 mg/dL — ABNORMAL HIGH (ref 70–99)
Potassium: 4 mmol/L (ref 3.5–5.1)
Sodium: 137 mmol/L (ref 135–145)

## 2023-06-12 LAB — CBC
HCT: 41.8 % (ref 36.0–46.0)
Hemoglobin: 13.9 g/dL (ref 12.0–15.0)
MCH: 29.2 pg (ref 26.0–34.0)
MCHC: 33.3 g/dL (ref 30.0–36.0)
MCV: 87.8 fL (ref 80.0–100.0)
Platelets: 281 10*3/uL (ref 150–400)
RBC: 4.76 MIL/uL (ref 3.87–5.11)
RDW: 13.9 % (ref 11.5–15.5)
WBC: 10.9 10*3/uL — ABNORMAL HIGH (ref 4.0–10.5)
nRBC: 0 % (ref 0.0–0.2)

## 2023-06-12 LAB — CBG MONITORING, ED: Glucose-Capillary: 183 mg/dL — ABNORMAL HIGH (ref 70–99)

## 2023-06-12 NOTE — ED Triage Notes (Signed)
 Pt via POV c/o palpitations, muscle cramps, and eye twitching all day. She notes she has recently changed her diet per PCP recommendation due to elevated blood sugar, and she has been stressed at work. Denies CP, SOB, N/V/dizziness.

## 2023-06-13 MED ORDER — LORAZEPAM 1 MG PO TABS: 0.5000 mg | ORAL_TABLET | Freq: Once | ORAL | Status: DC

## 2023-06-13 MED ORDER — HYDROXYZINE HCL 25 MG PO TABS
25.0000 mg | ORAL_TABLET | Freq: Four times a day (QID) | ORAL | 0 refills | Status: AC | PRN
Start: 2023-06-13 — End: ?

## 2023-06-13 NOTE — ED Provider Notes (Signed)
 [facility] Provider Note   CSN: [id number] Arrival date & time: 06/12/23  2224     History  Chief Complaint  Patient presents with   Palpitations   Eye Twitching    Jenna Mcclure is a 43 y.o. female.  Patient presents to the emergency department with multiple complaints.  Patient reports that she feels drained and tired.  She has been having some palpitations today.  Additionally she had some cramping of her hands and feet.  She also noticed some eye twitching.  All of these symptoms have concerned her because she was recently told by her doctor that she has prediabetes.  Patient reports that she is a workaholic and puts in many hours, does not sleep much.       Home Medications Prior to Admission medications   Medication Sig Start Date End Date Taking? Authorizing Provider  hydrOXYzine  (ATARAX ) 25 MG tablet Take 1 tablet (25 mg total) by mouth every 6 (six) hours as needed for anxiety. 06/13/23  Yes Enola Siebers, Lonni PARAS, MD  Cholecalciferol (VITAMIN D ) 2000 UNITS CAPS Take 2,000 Units by mouth daily.    [provider]  famotidine  (PEPCID ) 40 MG tablet Take 1 tablet (40 mg total) by mouth as needed for heartburn or indigestion. 12/20/22   Schutt, Marsa HERO, PA-C  ferrous sulfate  325 (65 FE) MG EC tablet Take 325 mg by mouth daily.    [provider]  ibuprofen  (ADVIL ) 200 MG tablet Take 4 tablets (800 mg total) by mouth every 8 (eight) hours as needed. 05/14/22   Johnnye Ade, MD  Multiple Vitamin (MULTIVITAMIN WITH MINERALS) TABS tablet Take 1 tablet by mouth daily.    [provider]  oxyCODONE -acetaminophen  (PERCOCET/ROXICET) 5-325 MG tablet Take 1 tablet by mouth every 6 (six) hours as needed for severe pain. 02/02/22   Randol Simmonds, MD  pantoprazole  (PROTONIX ) 20 MG tablet Take 1 tablet (20 mg total) by mouth daily. 12/20/22   Schutt, Marsa HERO, PA-C  PROGESTERONE PO Take by mouth as needed.     [provider]  Norgestim-Eth Marton Triphasic (ORTHO TRI-CYCLEN LO PO) Take 1 tablet by mouth daily.  08/20/11  [provider]      Allergies    Miconazole nitrate    Review of Systems   Review of Systems  Physical Exam Updated Vital Signs BP (!) 127/91 (BP Location: Right Arm)   Pulse 84   Temp 98.3 F (36.8 C) (Oral)   Resp 20   Ht 5' 2 (1.575 m)   Wt 82.6 kg   LMP 05/08/2022 (Approximate)   SpO2 100%   BMI 33.29 kg/m  Physical Exam Vitals and nursing note reviewed.  Constitutional:      General: She is not in acute distress.    Appearance: She is well-developed.  HENT:     Head: Normocephalic and atraumatic.     Mouth/Throat:     Mouth: Mucous membranes are moist.  Eyes:     General: Vision grossly intact. Gaze aligned appropriately.     Extraocular Movements: Extraocular movements intact.     Conjunctiva/sclera: Conjunctivae normal.  Cardiovascular:     Rate and Rhythm: Normal rate and regular rhythm.     Pulses: Normal pulses.     Heart sounds: Normal heart sounds, S1 normal and S2 normal. No murmur heard.    No friction rub. No gallop.  Pulmonary:     Effort: Pulmonary effort is normal. No respiratory distress.  Breath sounds: Normal breath sounds.  Abdominal:     General: Bowel sounds are normal.     Palpations: Abdomen is soft.     Tenderness: There is no abdominal tenderness. There is no guarding or rebound.     Hernia: No hernia is present.  Musculoskeletal:        General: No swelling.     Cervical back: Full passive range of motion without pain, normal range of motion and neck supple. No spinous process tenderness or muscular tenderness. Normal range of motion.     Right lower leg: No edema.     Left lower leg: No edema.  Skin:    General: Skin is warm and dry.     Capillary Refill: Capillary refill takes less than 2 seconds.     Findings: No ecchymosis, erythema, rash or wound.  Neurological:     General: No focal deficit  present.     Mental Status: She is alert and oriented to person, place, and time.     GCS: GCS eye subscore is 4. GCS verbal subscore is 5. GCS motor subscore is 6.     Cranial Nerves: Cranial nerves 2-12 are intact.     Sensory: Sensation is intact.     Motor: Motor function is intact.     Coordination: Coordination is intact.     Comments: Extraocular muscle movement: normal No visual field cut Pupils: equal and reactive both direct and consensual response is normal No nystagmus present    Sensory function is intact to light touch, pinprick Proprioception intact  Grip strength 5/5 symmetric in upper extremities No pronator drift Normal finger to nose bilaterally  Lower extremity strength 5/5 against gravity Normal heel to shin bilaterally  Gait: normal   Psychiatric:        Attention and Perception: Attention normal.        Mood and Affect: Mood normal.        Speech: Speech normal.        Behavior: Behavior normal.     ED Results / Procedures / Treatments   Labs (all labs ordered are listed, but only abnormal results are displayed) Labs Reviewed  BASIC METABOLIC PANEL - Abnormal; Notable for the following components:      Result Value   Glucose, Bld 131 (*)    All other components within normal limits  CBC - Abnormal; Notable for the following components:   WBC 10.9 (*)    All other components within normal limits  CBG MONITORING, ED - Abnormal; Notable for the following components:   Glucose-Capillary 183 (*)    All other components within normal limits    EKG EKG Interpretation Date/Time:  Thursday June 12 2023 22:49:14 EST Ventricular Rate:  95 PR Interval:  156 QRS Duration:  74 QT Interval:  340 QTC Calculation: 427 R Axis:   53  Text Interpretation: Normal sinus rhythm Right atrial enlargement Borderline ECG When compared with ECG of 20-Dec-2022 14:06, No acute changes Confirmed by Haze Lonni PARAS ([id number]) on 06/12/2023 11:41:16  PM  Radiology DG Chest Port 1 View Result Date: 06/12/2023 CLINICAL DATA:  Atrial fibrillation EXAM: PORTABLE CHEST 1 VIEW COMPARISON:  Chest x-ray 12/20/2022 FINDINGS: The heart size and mediastinal contours are within normal limits. Both lungs are clear. The visualized skeletal structures are unremarkable. IMPRESSION: No active disease. Electronically Signed   By: Greig Pique M.D.   On: 06/12/2023 23:00    Procedures Procedures    Medications Ordered in ED  Medications  LORazepam  (ATIVAN ) tablet 0.5 mg (has no administration in time range)    ED Course/ Medical Decision Making/ A&P                                 Medical Decision Making Amount and/or Complexity of Data Reviewed Labs: ordered. Radiology: ordered.  Risk Prescription drug management.   Presents with complaints of intermittent muscle spasms, cramping, heart palpitations.  This is in the setting of increased stress secondary to her new job with more responsibilities.  She does not sleep much.  She appears slightly anxious here in the emergency department.  Otherwise, however, she appears well.  Neurologic exam is normal.  No concern for stroke or TIA.  Patient's EKG is unremarkable.  Lab work is reassuring as well, no electrolyte abnormality.  Patient just started exercising at the recommendation of her doctor.  This may be contributory to her muscle spasms.  Additionally she is not sleeping and is very stressed, anxious.  Will give an Ativan  today and a limited supply of Vistaril  to be used as needed, follow-up with primary doctor.  She appears well and does not require any further emergency department workup.        Final Clinical Impression(s) / ED Diagnoses Final diagnoses:  Palpitations  Muscle spasm  Stress    Rx / DC Orders ED Discharge Orders          Ordered    hydrOXYzine  (ATARAX ) 25 MG tablet  Every 6 hours PRN        06/13/23 0015              Haze Lonni PARAS, MD 06/13/23  0015

## 2023-08-13 ENCOUNTER — Encounter: Payer: Self-pay | Admitting: Obstetrics and Gynecology

## 2023-08-13 DIAGNOSIS — Z1231 Encounter for screening mammogram for malignant neoplasm of breast: Secondary | ICD-10-CM

## 2023-08-20 ENCOUNTER — Other Ambulatory Visit: Payer: Self-pay | Admitting: Obstetrics and Gynecology

## 2023-08-20 DIAGNOSIS — Z1231 Encounter for screening mammogram for malignant neoplasm of breast: Secondary | ICD-10-CM

## 2023-09-26 ENCOUNTER — Ambulatory Visit
Admission: RE | Admit: 2023-09-26 | Discharge: 2023-09-26 | Disposition: A | Discharge status: Home / Self Care | Source: Ambulatory Visit | Attending: Obstetrics and Gynecology | Admitting: Obstetrics and Gynecology

## 2023-09-26 DIAGNOSIS — Z1231 Encounter for screening mammogram for malignant neoplasm of breast: Secondary | ICD-10-CM | POA: Diagnosis not present

## 2024-01-19 DIAGNOSIS — E559 Vitamin D deficiency, unspecified: Secondary | ICD-10-CM | POA: Diagnosis not present

## 2024-01-19 DIAGNOSIS — I10 Essential (primary) hypertension: Secondary | ICD-10-CM | POA: Diagnosis not present

## 2024-01-19 DIAGNOSIS — R002 Palpitations: Secondary | ICD-10-CM | POA: Diagnosis not present

## 2024-01-19 DIAGNOSIS — R319 Hematuria, unspecified: Secondary | ICD-10-CM | POA: Diagnosis not present

## 2024-01-19 DIAGNOSIS — R7309 Other abnormal glucose: Secondary | ICD-10-CM | POA: Diagnosis not present

## 2024-01-26 ENCOUNTER — Ambulatory Visit: Attending: Nurse Practitioner | Admitting: Nurse Practitioner

## 2024-01-26 ENCOUNTER — Encounter: Payer: Self-pay | Admitting: Nurse Practitioner

## 2024-01-26 ENCOUNTER — Ambulatory Visit

## 2024-01-26 VITALS — BP 120/82 | HR 75 | Ht 62.0 in | Wt 186.8 lb

## 2024-01-26 DIAGNOSIS — E782 Mixed hyperlipidemia: Secondary | ICD-10-CM

## 2024-01-26 DIAGNOSIS — R002 Palpitations: Secondary | ICD-10-CM

## 2024-01-26 DIAGNOSIS — I872 Venous insufficiency (chronic) (peripheral): Secondary | ICD-10-CM

## 2024-01-26 DIAGNOSIS — R7303 Prediabetes: Secondary | ICD-10-CM

## 2024-01-26 NOTE — Progress Notes (Signed)
 Office Visit    Patient Name: Jenna Mcclure Date of Encounter: 01/26/2024  Primary Care Provider:  Katina Pfeiffer, PA-C Primary Cardiologist:  Gordy Bergamo, MD  Chief Complaint    43 year old female with a history of palpitations, borderline elevated BP, hyperlipidemia, prediabetes, venous insufficiency, and GERD who presents for heart first clinic new patient evaluation.  Past Medical History    Past Medical History:  Diagnosis Date   Allergic rhinitis    Anemia    currently taking iron supplements as of 05/02/22 per pt   Borderline hypertension    Depression 07-Feb-2017   after mother passed away   Facial nerve palsy    right, resolved as of 05/02/22   GERD (gastroesophageal reflux disease)    currently taking famotidine  prn as of 05/02/22   Heart murmur    MILD -  ASYMPTOMATIC   History of abnormal cervical Pap smear    History of ovarian cyst    Pain    right-sided pain and burning sensation below the waist that started after epidural given during childbirth around 02-08-11, resolved as of 05/02/22 per pt   TMJ (dislocation of temporomandibular joint)    right-sided   Uterine fibroid    Wears glasses    Wears glasses    Past Surgical History:  Procedure Laterality Date   DIAGNOSTIC LAPAROSCOPY  02/08/2008   To look for endometriosis- negative per patient   HYSTERECTOMY ABDOMINAL WITH SALPINGECTOMY Bilateral 05/13/2022   Procedure: TOTAL ABDOMINAL HYSTERECTOMY WITH BILATERAL SALPINGECTOMY;  Surgeon: Johnnye Ade, MD;  Location: [facility];  Service: Gynecology;  Laterality: Bilateral;   LAPAROSCOPIC TUBAL LIGATION Bilateral 02/15/2014   Procedure: LAPAROSCOPIC TUBAL LIGATION WITH FILSHIE CLIPS;  Surgeon: Ade CHRISTELLA Johnnye, MD;  Location: [facility];  Service: Gynecology;  Laterality: Bilateral;    Allergies  Allergies  Allergen Reactions   Miconazole Nitrate Hives, Itching and Rash     Labs/Other Studies Reviewed    The following  studies were reviewed today:     Recent Labs: 06/12/2023: BUN 6; Creatinine, Ser 0.87; Hemoglobin 13.9; Platelets 281; Potassium 4.0; Sodium 137  Recent Lipid Panel    Component Value Date/Time   CHOL 190 07/12/2012 0909   TRIG 65 07/12/2012 0909   HDL 61 07/12/2012 0909   CHOLHDL 3.1 07/12/2012 0909   VLDL 13 07/12/2012 0909   LDLCALC 116 (H) 07/12/2012 0909    History of Present Illness    43 year old female with the above past medical history including palpitations, borderline elevated BP, hyperlipidemia, prediabetes, venous insufficiency, and GERD.  She presents today for heart first clinic new patient evaluation. She saw her PCP on 01/19/2024 and reported increased palpitations in the setting of increased stress/anxiety.  She was referred to cardiology.  She shares that she was previously evaluated by Dr. Bergamo. She was in her late 25s at the time. Per patient, workup was unremarkable (she thinks she may have had ETT, records not available for review). She does have a history of elevated blood pressure after childbirth, now resolved, she is not on any antihypertensive medication at this time. Since February/March 2025, she reports increased palpitations, which she describes as an intermittent fluttering in her chest with associated feeling of a feeling of shakiness. She will go days at a time without symptoms, but will occasionally notice symptoms several days in a row.  Symptoms last for several minutes at a time and resolve spontaneously.  She cannot identify any specific triggers.  She drinks  1 cup of coffee per day.  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  She is actively trying to lose weight and has been exercising regularly.  She will note occasional left arm pain, usually this occurs the day after exercise.  She denies any exertional symptoms concerning for angina.  She had a hysterectomy in 2023.  She has 6  children.  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 She is currently on sabbatical and plans to return to work on February 11, 2024.    Home Medications    Current Outpatient Medications  Medication Sig Dispense Refill   clonazePAM  (KLONOPIN ) 0.5 MG tablet Take 0.5 mg by mouth daily as needed for anxiety.     ergocalciferol  (VITAMIN D2) 1.25 MG (50000 UT) capsule Take 50,000 Units by mouth once a week.     famotidine  (PEPCID ) 40 MG tablet Take 1 tablet (40 mg total) by mouth as needed for heartburn or indigestion. 30 tablet 0   ibuprofen  (ADVIL ) 200 MG tablet Take 4 tablets (800 mg total) by mouth every 8 (eight) hours as needed. 30 tablet 2   Cholecalciferol (VITAMIN D ) 2000 UNITS CAPS Take 2,000 Units by mouth daily. (Patient not taking: Reported on 01/26/2024)     ferrous sulfate  325 (65 FE) MG EC tablet Take 325 mg by mouth daily. (Patient not taking: Reported on 01/26/2024)     hydrOXYzine  (ATARAX ) 25 MG tablet Take 1 tablet (25 mg total) by mouth every 6 (six) hours as needed for anxiety. (Patient not taking: Reported on 01/26/2024) 20 tablet 0   Multiple Vitamin (MULTIVITAMIN WITH MINERALS) TABS tablet Take 1 tablet by mouth daily. (Patient not taking: Reported on 01/26/2024)     oxyCODONE -acetaminophen  (PERCOCET/ROXICET) 5-325 MG tablet Take 1 tablet by mouth every 6 (six) hours as needed for severe pain. (Patient not taking: Reported on 01/26/2024) 15 tablet 0   pantoprazole  (PROTONIX ) 20 MG tablet Take 1 tablet (20 mg total) by mouth daily. (Patient not taking: Reported on 01/26/2024) 30 tablet 0   PROGESTERONE PO Take by mouth as needed. (Patient not taking: Reported on 01/26/2024)     No current facility-administered medications for this visit.   Facility-Administered Medications Ordered in Other Visits  Medication Dose Route Frequency Provider Last Rate Last Admin   gadopentetate dimeglumine  (MAGNEVIST ) injection 15 mL  15 mL Intravenous Once PRN Penumalli, Vikram R, MD          Review of Systems    She denies chest pain, dyspnea, pnd, orthopnea, n, v, dizziness, syncope, edema, weight gain, or early satiety. All other systems reviewed and are otherwise negative except as noted above.   Physical Exam    VS:  BP 120/82   Pulse 75   Ht 5' 2 (1.575 m)   Wt 186 lb 12.8 oz (84.7 kg)   LMP 05/08/2022 (Approximate)   SpO2 98%   BMI 34.17 kg/m  , GEN: Well nourished, well developed, in no acute distress. HEENT: normal. Neck: Supple, no JVD, carotid bruits, or masses. Cardiac: RRR, no murmurs, rubs, or gallops. No clubbing, cyanosis, edema.  Radials/DP/PT 2+ and equal bilaterally.  Respiratory:  Respirations regular and unlabored, clear to auscultation bilaterally. GI: Soft, nontender, nondistended, BS + x 4. MS: no deformity or atrophy. Skin: warm and dry, no rash. Neuro:  Strength and sensation are intact. Psych: Normal affect.  Accessory Clinical Findings    ECG personally reviewed  by me today - EKG Interpretation Date/Time:  Monday January 26 2024 08:51:02 EDT Ventricular Rate:  75 PR Interval:  162 QRS Duration:  74 QT Interval:  362 QTC Calculation: 404 R Axis:   50  Text Interpretation: Normal sinus rhythm Normal ECG When compared with ECG of 12-Jun-2023 22:49, No significant change was found Confirmed by Daneen Perkins ([id number]) on 01/26/2024 8:53:52 AM  - no acute changes.   Lab Results  Component Value Date   WBC 10.9 (H) 06/12/2023   HGB 13.9 06/12/2023   HCT 41.8 06/12/2023   MCV 87.8 06/12/2023   PLT 281 06/12/2023   Lab Results  Component Value Date   CREATININE 0.87 06/12/2023   BUN 6 06/12/2023   NA 137 06/12/2023   K 4.0 06/12/2023   CL 106 06/12/2023   CO2 24 06/12/2023   Lab Results  Component Value Date   ALT 14 12/20/2022   AST 18 12/20/2022   ALKPHOS 57 12/20/2022   BILITOT 0.4 12/20/2022   Lab Results  Component Value Date   CHOL 190 07/12/2012   HDL 61 07/12/2012   LDLCALC 116 (H) 07/12/2012   TRIG 65  07/12/2012   CHOLHDL 3.1 07/12/2012    No results found for: HGBA1C  Assessment & Plan    1. Palpitations: Since February/March 2025, she reports increased palpitations, which she describes as an intermittent fluttering in her chest with associated feeling of a feeling of shakiness.  She will go days at a time without symptoms, but will occasionally notice symptoms for several days in a row.  Symptoms last for several minutes at a time and resolve spontaneously.  She also reports intermittent left arm pain, this usually occurs a day after exercise.  She denies any exertional symptoms.  Through shared decision making, will check 14-day ZIO monitor. If monitor unremarkable, consider need for further workup (echo, ETT versus coronary CTA).    2. Hyperlipidemia: LDL was 143 in 01/2024.  The 10-year ASCVD risk score (Arnett DK, et al., 2019) is: 0.3%.  Encouraged ongoing lifestyle modifications with diet and exercise.  3. Prediabetes: A1c was 6.2 in 01/2024.  Encouraged ongoing lifestyle modifications with diet and exercise.  Monitored per PCP.  4. Venous insufficiency: Noted on venous reflux study in 2019.  She denies any significant lower extremity edema.  Encouraged compression, elevation.  5. Disposition: Follow-up in 6 to 8 weeks.  She wishes to reestablish with Dr. Ladona.     Perkins JAYSON Daneen, NP 01/26/2024, 11:54 AM

## 2024-01-26 NOTE — Progress Notes (Unsigned)
Enrolled for [employer] to mail a ZIO XT long term holter monitor to the patients address on file.   Dr. Nadara Eaton to read.

## 2024-01-26 NOTE — Patient Instructions (Signed)
 Medication Instructions:  Your physician recommends that you continue on your current medications as directed. Please refer to the Current Medication list given to you today.  *If you need a refill on your cardiac medications before your next appointment, please call your pharmacy*  Lab Work: NONE ordered at this time of appointment   Testing/Procedures: ZIO XT- Long Term Monitor Instructions  Your physician has requested you wear a ZIO patch monitor for 14 days.  This is a single patch monitor. [employer] supplies one patch monitor per enrollment. Additional stickers are not available. Please do not apply patch if you will be having a Nuclear Stress Test,  Echocardiogram, Cardiac CT, MRI, or Chest Xray during the period you would be wearing the  monitor. The patch cannot be worn during these tests. You cannot remove and re-apply the  ZIO XT patch monitor.  Your ZIO patch monitor will be mailed 3 day USPS to your address on file. It may take 3-5 days  to receive your monitor after you have been enrolled.  Once you have received your monitor, please review the enclosed instructions. Your monitor  has already been registered assigning a specific monitor serial # to you.  Billing and Patient Assistance Program Information  We have supplied [employer] with any of your insurance information on file for billing purposes. [employer] offers a sliding scale Patient Assistance Program for patients that do not have  insurance, or whose insurance does not completely cover the cost of the ZIO monitor.  You must apply for the Patient Assistance Program to qualify for this discounted rate.  To apply, please call [employer] at [phone], select option 4, select option 2, ask to apply for  Patient Assistance Program. Meredeth will ask your household income, and how many people  are in your household. They will quote your out-of-pocket cost based on that information.  [employer] will also be able to set up a  27-month, interest-free payment plan if needed.  Applying the monitor   Shave hair from upper left chest.  Hold abrader disc by orange tab. Rub abrader in 40 strokes over the upper left chest as  indicated in your monitor instructions.  Clean area with 4 enclosed alcohol pads. Let dry.  Apply patch as indicated in monitor instructions. Patch will be placed under collarbone on left  side of chest with arrow pointing upward.  Rub patch adhesive wings for 2 minutes. Remove white label marked 1. Remove the white  label marked 2. Rub patch adhesive wings for 2 additional minutes.  While looking in a mirror, press and release button in center of patch. A small green light will  flash 3-4 times. This will be your only indicator that the monitor has been turned on.  Do not shower for the first 24 hours. You may shower after the first 24 hours.  Press the button if you feel a symptom. You will hear a small click. Record Date, Time and  Symptom in the Patient Logbook.  When you are ready to remove the patch, follow instructions on the last 2 pages of Patient  Logbook. Stick patch monitor onto the last page of Patient Logbook.  Place Patient Logbook in the blue and white box. Use locking tab on box and tape box closed  securely. The blue and white box has prepaid postage on it. Please place it in the mailbox as  soon as possible. Your physician should have your test results approximately 7 days after the  monitor has been  mailed back to [facility].  Call [facility] Customer Care at [phone] if you have questions regarding  your ZIO XT patch monitor. Call them immediately if you see an orange light blinking on your  monitor.  If your monitor falls off in less than 4 days, contact our Monitor department at [phone].  If your monitor becomes loose or falls off after 4 days call [employer] at [phone] for  suggestions on securing your monitor   Follow-Up: At [facility], you and your health needs are our priority.  As part of our continuing mission to provide you with exceptional heart care, our providers are all part of one team.  This team includes your primary Cardiologist (physician) and Advanced Practice Providers or APPs (Physician Assistants and Nurse Practitioners) who all work together to provide you with the care you need, when you need it.  Your next appointment:   6-8  week(s)  Provider:   Dr. Ladona or Damien Braver, NP          We recommend signing up for the patient portal called MyChart.  Sign up information is provided on this After Visit Summary.  MyChart is used to connect with patients for Virtual Visits (Telemedicine).  Patients are able to view lab/test results, encounter notes, upcoming appointments, etc.  Non-urgent messages can be sent to your provider as well.   To learn more about what you can do with MyChart, go to [email or web address].

## 2024-03-11 ENCOUNTER — Telehealth: Payer: Self-pay

## 2024-03-11 NOTE — Telephone Encounter (Signed)
 Lmom, to see if pt completed her cardiac monitor. Pt has a scheduled appointment on Tuesday 03/16/2024 with Damien Braver NP. No results are present. Pt needs to follow up after monitor is completed. Waiting on a return call.

## 2024-03-16 ENCOUNTER — Ambulatory Visit: Admitting: Nurse Practitioner

## 2024-03-16 NOTE — Progress Notes (Deleted)
 Office Visit    Patient Name: Jenna Mcclure Date of Encounter: 03/16/2024  Primary Care Provider:  Katina Pfeiffer, PA-C Primary Cardiologist:  Gordy Bergamo, MD  Chief Complaint    43 year old female with a history of palpitations, borderline elevated BP, hyperlipidemia, prediabetes, venous insufficiency, and GERD who presents for follow-up related to palpitations.  Past Medical History    Past Medical History:  Diagnosis Date   Allergic rhinitis    Anemia    currently taking iron supplements as of 05/02/22 per pt   Borderline hypertension    Depression 04-07-17   after mother passed away   Facial nerve palsy    right, resolved as of 05/02/22   GERD (gastroesophageal reflux disease)    currently taking famotidine  prn as of 05/02/22   Heart murmur    MILD -  ASYMPTOMATIC   History of abnormal cervical Pap smear    History of ovarian cyst    Pain    right-sided pain and burning sensation below the waist that started after epidural given during childbirth around 04-08-11, resolved as of 05/02/22 per pt   TMJ (dislocation of temporomandibular joint)    right-sided   Uterine fibroid    Wears glasses    Wears glasses    Past Surgical History:  Procedure Laterality Date   DIAGNOSTIC LAPAROSCOPY  04-07-08   To look for endometriosis- negative per patient   HYSTERECTOMY ABDOMINAL WITH SALPINGECTOMY Bilateral 05/13/2022   Procedure: TOTAL ABDOMINAL HYSTERECTOMY WITH BILATERAL SALPINGECTOMY;  Surgeon: Johnnye Ade, MD;  Location: [facility] [place];  Service: Gynecology;  Laterality: Bilateral;   LAPAROSCOPIC TUBAL LIGATION Bilateral 02/15/2014   Procedure: LAPAROSCOPIC TUBAL LIGATION WITH FILSHIE CLIPS;  Surgeon: Ade CHRISTELLA Johnnye, MD;  Location: [facility];  Service: Gynecology;  Laterality: Bilateral;    Allergies  Allergies  Allergen Reactions   Miconazole Nitrate Hives, Itching and Rash     Labs/Other Studies Reviewed    The following studies were  reviewed today: ***    Recent Labs: 06/12/2023: BUN 6; Creatinine, Ser 0.87; Hemoglobin 13.9; Platelets 281; Potassium 4.0; Sodium 137  Recent Lipid Panel    Component Value Date/Time   CHOL 190 07/12/2012 0909   TRIG 65 07/12/2012 0909   HDL 61 07/12/2012 0909   CHOLHDL 3.1 07/12/2012 0909   VLDL 13 07/12/2012 0909   LDLCALC 116 (H) 07/12/2012 0909    History of Present Illness    43 year old female with the above past medical history including palpitations, borderline elevated BP, hyperlipidemia, prediabetes, venous insufficiency, and GERD.   She was referred to cardiology in 01/2024 in the setting of palpitations.  Previously evaluated by Dr. Bergamo in her late 19s.  Per patient, workup was unremarkable at the time (records not available for review). She has a history of elevated blood pressure after childbirth, no longer on antihypertensive medication.  She was last seen in the office on 01/26/2024 for [facility] and reported a several month history of increased palpitations, which she described as an intermittent fluttering in her chest with associated feeling of a feeling of shakiness.  ZIO monitor was ordered, results pending.  She presents today for follow-up.  Since her last visit  1. Palpitations: Since February/March 2025, she reports increased palpitations, which she describes as an intermittent fluttering in her chest with associated feeling of a feeling of shakiness.  She will go days at a time without symptoms, but will occasionally notice symptoms for several days  in a row.  Symptoms last for several minutes at a time and resolve spontaneously.  She also reports intermittent left arm pain, this usually occurs a day after exercise.  She denies any exertional symptoms.  Through shared decision making, will check 14-day ZIO monitor. If monitor unremarkable, consider need for further workup (echo, ETT versus coronary CTA).     2. Hyperlipidemia: LDL  was 143 in 01/2024.  The 10-year ASCVD risk score (Arnett DK, et al., 2019) is: 0.3%.  Encouraged ongoing lifestyle modifications with diet and exercise.   3. Prediabetes: A1c was 6.2 in 01/2024.  Encouraged ongoing lifestyle modifications with diet and exercise.  Monitored per PCP.   4. Venous insufficiency: Noted on venous reflux study in 2019.  She denies any significant lower extremity edema.  Encouraged compression, elevation.   5. Disposition: Follow-up in  Home Medications    Current Outpatient Medications  Medication Sig Dispense Refill   Cholecalciferol (VITAMIN D ) 2000 UNITS CAPS Take 2,000 Units by mouth daily. (Patient not taking: Reported on 01/26/2024)     clonazePAM  (KLONOPIN ) 0.5 MG tablet Take 0.5 mg by mouth daily as needed for anxiety.     ergocalciferol  (VITAMIN D2) 1.25 MG (50000 UT) capsule Take 50,000 Units by mouth once a week.     famotidine  (PEPCID ) 40 MG tablet Take 1 tablet (40 mg total) by mouth as needed for heartburn or indigestion. 30 tablet 0   ferrous sulfate  325 (65 FE) MG EC tablet Take 325 mg by mouth daily. (Patient not taking: Reported on 01/26/2024)     hydrOXYzine  (ATARAX ) 25 MG tablet Take 1 tablet (25 mg total) by mouth every 6 (six) hours as needed for anxiety. (Patient not taking: Reported on 01/26/2024) 20 tablet 0   ibuprofen  (ADVIL ) 200 MG tablet Take 4 tablets (800 mg total) by mouth every 8 (eight) hours as needed. 30 tablet 2   Multiple Vitamin (MULTIVITAMIN WITH MINERALS) TABS tablet Take 1 tablet by mouth daily. (Patient not taking: Reported on 01/26/2024)     oxyCODONE -acetaminophen  (PERCOCET/ROXICET) 5-325 MG tablet Take 1 tablet by mouth every 6 (six) hours as needed for severe pain. (Patient not taking: Reported on 01/26/2024) 15 tablet 0   pantoprazole  (PROTONIX ) 20 MG tablet Take 1 tablet (20 mg total) by mouth daily. (Patient not taking: Reported on 01/26/2024) 30 tablet 0   PROGESTERONE PO Take by mouth as needed. (Patient not taking:  Reported on 01/26/2024)     No current facility-administered medications for this visit.   Facility-Administered Medications Ordered in Other Visits  Medication Dose Route Frequency Provider Last Rate Last Admin   gadopentetate dimeglumine  (MAGNEVIST ) injection 15 mL  15 mL Intravenous Once PRN Penumalli, Vikram R, MD         Review of Systems    ***.  All other systems reviewed and are otherwise negative except as noted above.    Physical Exam    VS:  LMP 05/08/2022 (Approximate)  , BMI There is no height or weight on file to calculate BMI.     GEN: Well nourished, well developed, in no acute distress. HEENT: normal. Neck: Supple, no JVD, carotid bruits, or masses. Cardiac: RRR, no murmurs, rubs, or gallops. No clubbing, cyanosis, edema.  Radials/DP/PT 2+ and equal bilaterally.  Respiratory:  Respirations regular and unlabored, clear to auscultation bilaterally. GI: Soft, nontender, nondistended, BS + x 4. MS: no deformity or atrophy. Skin: warm and dry, no rash. Neuro:  Strength and sensation are intact. Psych: Normal affect.  Accessory Clinical Findings    ECG personally reviewed by me today -    - no acute changes.   Lab Results  Component Value Date   WBC 10.9 (H) 06/12/2023   HGB 13.9 06/12/2023   HCT 41.8 06/12/2023   MCV 87.8 06/12/2023   PLT 281 06/12/2023   Lab Results  Component Value Date   CREATININE 0.87 06/12/2023   BUN 6 06/12/2023   NA 137 06/12/2023   K 4.0 06/12/2023   CL 106 06/12/2023   CO2 24 06/12/2023   Lab Results  Component Value Date   ALT 14 12/20/2022   AST 18 12/20/2022   ALKPHOS 57 12/20/2022   BILITOT 0.4 12/20/2022   Lab Results  Component Value Date   CHOL 190 07/12/2012   HDL 61 07/12/2012   LDLCALC 116 (H) 07/12/2012   TRIG 65 07/12/2012   CHOLHDL 3.1 07/12/2012    No results found for: HGBA1C  Assessment & Plan    1.  ***  No BP recorded.  {Refresh Note OR Click here to enter BP  :1}***   Damien JAYSON Braver,  NP 03/16/2024, 5:10 AM

## 2024-03-18 DIAGNOSIS — R110 Nausea: Secondary | ICD-10-CM | POA: Diagnosis not present

## 2024-03-18 DIAGNOSIS — R197 Diarrhea, unspecified: Secondary | ICD-10-CM | POA: Diagnosis not present

## 2024-03-18 DIAGNOSIS — R1011 Right upper quadrant pain: Secondary | ICD-10-CM | POA: Diagnosis not present

## 2024-04-02 ENCOUNTER — Emergency Department (HOSPITAL_COMMUNITY)

## 2024-04-02 ENCOUNTER — Emergency Department (HOSPITAL_COMMUNITY)
Admission: EM | Admit: 2024-04-02 | Discharge: 2024-04-03 | Disposition: A | Discharge status: Home / Self Care | Attending: Emergency Medicine | Admitting: Emergency Medicine

## 2024-04-02 ENCOUNTER — Other Ambulatory Visit: Payer: Self-pay

## 2024-04-02 ENCOUNTER — Encounter (HOSPITAL_COMMUNITY): Payer: Self-pay | Admitting: *Deleted

## 2024-04-02 DIAGNOSIS — R0789 Other chest pain: Secondary | ICD-10-CM | POA: Diagnosis not present

## 2024-04-02 DIAGNOSIS — D72829 Elevated white blood cell count, unspecified: Secondary | ICD-10-CM | POA: Insufficient documentation

## 2024-04-02 DIAGNOSIS — R079 Chest pain, unspecified: Secondary | ICD-10-CM | POA: Diagnosis not present

## 2024-04-02 DIAGNOSIS — R0602 Shortness of breath: Secondary | ICD-10-CM | POA: Diagnosis not present

## 2024-04-02 DIAGNOSIS — K219 Gastro-esophageal reflux disease without esophagitis: Secondary | ICD-10-CM | POA: Insufficient documentation

## 2024-04-02 LAB — TROPONIN I (HIGH SENSITIVITY)
Troponin I (High Sensitivity): <2 ng/L (ref <18)
Troponin I (High Sensitivity): <2 ng/L (ref <18)

## 2024-04-02 LAB — BASIC METABOLIC PANEL WITH GFR
Anion gap: 10 (ref 5–15)
BUN: 6 mg/dL (ref 6–20)
CO2: 24 mmol/L (ref 22–32)
Calcium: 8.7 mg/dL — ABNORMAL LOW (ref 8.9–10.3)
Chloride: 103 mmol/L (ref 98–111)
Creatinine, Ser: 0.88 mg/dL (ref 0.44–1.00)
GFR, Estimated: >60 mL/min (ref >60)
Glucose, Bld: 87 mg/dL (ref 70–99)
Potassium: 3.6 mmol/L (ref 3.5–5.1)
Sodium: 137 mmol/L (ref 135–145)

## 2024-04-02 LAB — CBC
HCT: 42 % (ref 36.0–46.0)
Hemoglobin: 13.6 g/dL (ref 12.0–15.0)
MCH: 28.8 pg (ref 26.0–34.0)
MCHC: 32.4 g/dL (ref 30.0–36.0)
MCV: 89 fL (ref 80.0–100.0)
Platelets: 304 K/uL (ref 150–400)
RBC: 4.72 MIL/uL (ref 3.87–5.11)
RDW: 13.9 % (ref 11.5–15.5)
WBC: 11 K/uL — ABNORMAL HIGH (ref 4.0–10.5)
nRBC: 0 % (ref 0.0–0.2)

## 2024-04-02 MED ORDER — ACETAMINOPHEN 325 MG PO TABS
650.0000 mg | ORAL_TABLET | Freq: Once | ORAL | Status: AC
  Administered 2024-04-02: 650 mg via ORAL

## 2024-04-02 MED ORDER — ACETAMINOPHEN 325 MG PO TABS
ORAL_TABLET | ORAL | Status: AC
  Filled 2024-04-02: qty 2

## 2024-04-02 NOTE — ED Triage Notes (Signed)
 L sided CP and SHOB since yesterday. Radiation in to L shoulder.

## 2024-04-03 LAB — HEPATIC FUNCTION PANEL
ALT: 14 U/L (ref 0–44)
AST: 16 U/L (ref 15–41)
Albumin: 3.6 g/dL (ref 3.5–5.0)
Alkaline Phosphatase: 61 U/L (ref 38–126)
Bilirubin, Direct: 0.1 mg/dL (ref 0.0–0.2)
Indirect Bilirubin: 0.6 mg/dL (ref 0.3–0.9)
Total Bilirubin: 0.7 mg/dL (ref 0.0–1.2)
Total Protein: 7.2 g/dL (ref 6.5–8.1)

## 2024-04-03 MED ORDER — ALUM & MAG HYDROXIDE-SIMETH 200-200-20 MG/5ML PO SUSP
30.0000 mL | Freq: Once | ORAL | Status: AC
  Administered 2024-04-03: 30 mL via ORAL
  Filled 2024-04-03: qty 30

## 2024-04-03 MED ORDER — PANTOPRAZOLE SODIUM 20 MG PO TBEC
20.0000 mg | DELAYED_RELEASE_TABLET | Freq: Once | ORAL | Status: AC
Start: 2024-04-03 — End: 2024-04-03
  Administered 2024-04-03: 20 mg via ORAL
  Filled 2024-04-03: qty 1

## 2024-04-03 MED ORDER — FAMOTIDINE 20 MG PO TABS
40.0000 mg | ORAL_TABLET | Freq: Once | ORAL | Status: AC
  Administered 2024-04-03: 40 mg via ORAL
  Filled 2024-04-03: qty 2

## 2024-04-03 MED ORDER — PANTOPRAZOLE SODIUM 20 MG PO TBEC
20.0000 mg | DELAYED_RELEASE_TABLET | Freq: Every day | ORAL | 0 refills | Status: AC
Start: 2024-04-03 — End: ?

## 2024-04-03 MED ORDER — FAMOTIDINE 40 MG PO TABS: 40.0000 mg | ORAL_TABLET | Freq: Every day | ORAL | 1 refills | Status: AC

## 2024-04-03 NOTE — ED Provider Notes (Signed)
 [facility] Provider Note   CSN: [id number] Arrival date & time: 04/02/24  8040     Patient presents with: Chest Pain and Shortness of Breath   Jenna Mcclure is a 43 y.o. female with a history of chronic pain syndrome, GERD, facial nerve palsy, depression, grief, nerve pain.  Presents to ED complaining of pain to her left upper abdomen.  States that this pain has been present since yesterday.  Reports that pain was manageable however has progressively worsened and after work today she states the pain became much worse.  She reports that the pain feels like an ache.  She denies that this pain radiates.  She denies that she is short of breath.  She apparently endorsed this to triage RN but reports to me that she is not short of breath however pain does catch my breath.  She denies any overt chest pain.  She states that she was seen for nausea and right upper quadrant tenderness at urgent care 1 week ago and discharged with reassuring workup.  She states that she has been off of her GERD medication for the last 1 month however goes on to state that she typically has chest pressure when she has GERD symptoms.  She denies any nausea or vomiting.  She does state that belching will help improve her pain and denies any aggravating factors.  She denies any fevers at home, cough.  She is PERC criteria negative.  She denies lightheadedness, dizziness or weakness.  She denies blood in her stool or diarrhea.  She denies any trauma to her left chest.   Chest Pain Shortness of Breath      Prior to Admission medications   Medication Sig Start Date End Date Taking? Authorizing Provider  famotidine  (PEPCID ) 40 MG tablet Take 1 tablet (40 mg total) by mouth daily. 04/03/24  Yes Ruthell Lonni FALCON, PA-C  pantoprazole  (PROTONIX ) 20 MG tablet Take 1 tablet (20 mg total) by mouth daily. 04/03/24  Yes Ruthell Lonni FALCON, PA-C  Cholecalciferol (VITAMIN D ) 2000 UNITS  CAPS Take 2,000 Units by mouth daily. Patient not taking: Reported on 01/26/2024    [provider]  clonazePAM  (KLONOPIN ) 0.5 MG tablet Take 0.5 mg by mouth daily as needed for anxiety.    [provider]  ergocalciferol  (VITAMIN D2) 1.25 MG (50000 UT) capsule Take 50,000 Units by mouth once a week.    [provider]  ferrous sulfate  325 (65 FE) MG EC tablet Take 325 mg by mouth daily. Patient not taking: Reported on 01/26/2024    [provider]  hydrOXYzine  (ATARAX ) 25 MG tablet Take 1 tablet (25 mg total) by mouth every 6 (six) hours as needed for anxiety. Patient not taking: Reported on 01/26/2024 06/13/23   Haze Lonni PARAS, MD  ibuprofen  (ADVIL ) 200 MG tablet Take 4 tablets (800 mg total) by mouth every 8 (eight) hours as needed. 05/14/22   Johnnye Ade, MD  Multiple Vitamin (MULTIVITAMIN WITH MINERALS) TABS tablet Take 1 tablet by mouth daily. Patient not taking: Reported on 01/26/2024    [provider]  oxyCODONE -acetaminophen  (PERCOCET/ROXICET) 5-325 MG tablet Take 1 tablet by mouth every 6 (six) hours as needed for severe pain. Patient not taking: Reported on 01/26/2024 02/02/22   Randol Simmonds, MD  PROGESTERONE PO Take by mouth as needed. Patient not taking: Reported on 01/26/2024    [provider]  Norgestim-Eth Marton Triphasic (ORTHO TRI-CYCLEN LO PO) Take 1 tablet by  mouth daily.  08/20/11  [provider]    Allergies: Miconazole nitrate    Review of Systems  Respiratory:  Positive for chest tightness.   All other systems reviewed and are negative.   Updated Vital Signs BP (!) 136/93 (BP Location: Right Arm)   Pulse 67   Temp (!) 97.5 F (36.4 C) (Oral)   Resp 14   Ht 5' 2 (1.575 m)   Wt 84.7 kg   LMP 05/08/2022 (Approximate)   SpO2 100%   BMI 34.15 kg/m   Physical Exam Vitals and nursing note reviewed.  Constitutional:      General: She is not in acute distress.    Appearance: She is  well-developed.  HENT:     Head: Normocephalic and atraumatic.  Eyes:     Conjunctiva/sclera: Conjunctivae normal.  Cardiovascular:     Rate and Rhythm: Normal rate and regular rhythm.     Heart sounds: No murmur heard. Pulmonary:     Effort: Pulmonary effort is normal. No respiratory distress.     Breath sounds: Normal breath sounds.  Chest:    Abdominal:     Palpations: Abdomen is soft.     Tenderness: There is no abdominal tenderness.  Musculoskeletal:        General: No swelling.     Cervical back: Neck supple.     Right lower leg: No edema.     Left lower leg: No edema.  Skin:    General: Skin is warm and dry.     Capillary Refill: Capillary refill takes less than 2 seconds.  Neurological:     Mental Status: She is alert.  Psychiatric:        Mood and Affect: Mood normal.     (all labs ordered are listed, but only abnormal results are displayed) Labs Reviewed  BASIC METABOLIC PANEL WITH GFR - Abnormal; Notable for the following components:      Result Value   Calcium 8.7 (*)    All other components within normal limits  CBC - Abnormal; Notable for the following components:   WBC 11.0 (*)    All other components within normal limits  HEPATIC FUNCTION PANEL  TROPONIN I (HIGH SENSITIVITY)  TROPONIN I (HIGH SENSITIVITY)    EKG: None  Radiology: DG Chest 2 View Result Date: 04/02/2024 CLINICAL DATA:  Left-sided chest pain and shortness of breath. EXAM: CHEST - 2 VIEW COMPARISON:  June 12, 2023 FINDINGS: The heart size and mediastinal contours are within normal limits. Both lungs are clear. The visualized skeletal structures are unremarkable. IMPRESSION: No active cardiopulmonary disease. Electronically Signed   By: Suzen Dials M.D.   On: 04/02/2024 20:40    Procedures   Medications Ordered in the ED  acetaminophen  (TYLENOL ) tablet 650 mg (650 mg Oral Given 04/02/24 2019)  famotidine  (PEPCID ) tablet 40 mg (40 mg Oral Given 04/03/24 0202)  pantoprazole   (PROTONIX ) EC tablet 20 mg (20 mg Oral Given 04/03/24 0202)  alum & mag hydroxide-simeth (MAALOX/MYLANTA) 200-200-20 MG/5ML suspension 30 mL (30 mLs Oral Given 04/03/24 0202)     Medical Decision Making Risk OTC drugs. Prescription drug management.   43 year old female presenting to the ED due to concerns of left upper chest wall tenderness.  She states she has a history of GERD and has not taken medication for this in quite some time.  She apparently reported to the RN in triage that she had chest pain and shortness of breath but on my examination she denies this.  Denies any chest pain.  States that she has left upper chest wall pain.  She denies any trauma.  She reports that when the pain exacerbates it will catch my breath but she denies any overt shortness of breath.  Overall she is nontoxic in appearance with reassuring vital signs.  Assessed with CBC, BMP, troponin x 2, hepatic function panel, chest x-ray and EKG.  CBC with slight leukocytosis to 11, no anemia.  Metabolic panel without electrolyte derangement, anion gap 10.  [place] function panel is without elevated LFTs or elevated bilirubin.  Her troponins are undetectable x 2.  Her EKG is nonischemic and her chest x-ray is unremarkable.  Patient was provided with GI cocktail, famotidine  and Protonix .  She reports that her symptoms resolved after this.  She was offered a CT angiogram to assess for blood clot and she deferred on receiving this.  I do feel this is reasonable, I do feel that blood clot is not as likely in this patient.  She is PERC arteria negative.  Despite this, I did offer the patient imaging and she did not wishing to receive this.  She relieved that her symptoms are secondary to GERD as she states that her symptoms resolved after medication was given.  At this time the patient is stable to discharge.  She reports she follows with PCP.  Will send her home with famotidine , Protonix  and have her follow-up with PCP.  Was  advised to return to ED with new symptoms and she voiced understanding.  Stable to discharge home.   Final diagnoses:  Gastroesophageal reflux disease, unspecified whether esophagitis present    ED Discharge Orders          Ordered    famotidine  (PEPCID ) 40 MG tablet  Daily        04/03/24 0318    pantoprazole  (PROTONIX ) 20 MG tablet  Daily        04/03/24 0318               Ruthell Lonni FALCON, PA-C 04/03/24 0318    Palumbo, April, MD 04/03/24 [phone]

## 2024-04-03 NOTE — Discharge Instructions (Addendum)
 As we discussed, I believe your symptoms are secondary to GERD.  Please begin taking 40 mg of Moding once a day.  May also take 20 mg of Protonix  once a day.  Please follow-up with your PCP for further care.  Return to the ED with any new symptoms.  I have sent your medications to your pharmacy on file.

## 2024-04-05 ENCOUNTER — Encounter (HOSPITAL_BASED_OUTPATIENT_CLINIC_OR_DEPARTMENT_OTHER): Payer: Self-pay | Admitting: Emergency Medicine

## 2024-04-05 ENCOUNTER — Emergency Department (HOSPITAL_BASED_OUTPATIENT_CLINIC_OR_DEPARTMENT_OTHER)

## 2024-04-05 ENCOUNTER — Emergency Department (HOSPITAL_BASED_OUTPATIENT_CLINIC_OR_DEPARTMENT_OTHER)
Admission: EM | Admit: 2024-04-05 | Discharge: 2024-04-05 | Disposition: A | Discharge status: Home / Self Care | Attending: Emergency Medicine | Admitting: Emergency Medicine

## 2024-04-05 ENCOUNTER — Other Ambulatory Visit: Payer: Self-pay

## 2024-04-05 DIAGNOSIS — R0789 Other chest pain: Secondary | ICD-10-CM | POA: Diagnosis not present

## 2024-04-05 DIAGNOSIS — R1013 Epigastric pain: Secondary | ICD-10-CM | POA: Diagnosis not present

## 2024-04-05 DIAGNOSIS — R079 Chest pain, unspecified: Secondary | ICD-10-CM | POA: Diagnosis not present

## 2024-04-05 DIAGNOSIS — J90 Pleural effusion, not elsewhere classified: Secondary | ICD-10-CM | POA: Diagnosis not present

## 2024-04-05 DIAGNOSIS — R1011 Right upper quadrant pain: Secondary | ICD-10-CM | POA: Diagnosis not present

## 2024-04-05 LAB — TROPONIN T, HIGH SENSITIVITY: Troponin T High Sensitivity: <15 ng/L (ref 0–19)

## 2024-04-05 LAB — CBC
HCT: 43.4 % (ref 36.0–46.0)
Hemoglobin: 14.4 g/dL (ref 12.0–15.0)
MCH: 28.9 pg (ref 26.0–34.0)
MCHC: 33.2 g/dL (ref 30.0–36.0)
MCV: 87.1 fL (ref 80.0–100.0)
Platelets: 299 K/uL (ref 150–400)
RBC: 4.98 MIL/uL (ref 3.87–5.11)
RDW: 13.7 % (ref 11.5–15.5)
WBC: 8.6 K/uL (ref 4.0–10.5)
nRBC: 0 % (ref 0.0–0.2)

## 2024-04-05 LAB — URINALYSIS, ROUTINE W REFLEX MICROSCOPIC
Bilirubin Urine: NEGATIVE
Glucose, UA: NEGATIVE mg/dL
Ketones, ur: NEGATIVE mg/dL
Leukocytes,Ua: NEGATIVE
Nitrite: NEGATIVE
Protein, ur: NEGATIVE mg/dL
Specific Gravity, Urine: 1.02 (ref 1.005–1.030)
pH: 6 (ref 5.0–8.0)

## 2024-04-05 LAB — HEPATIC FUNCTION PANEL
ALT: 11 U/L (ref 0–44)
AST: 15 U/L (ref 15–41)
Albumin: 4.3 g/dL (ref 3.5–5.0)
Alkaline Phosphatase: 73 U/L (ref 38–126)
Bilirubin, Direct: 0.2 mg/dL (ref 0.0–0.2)
Indirect Bilirubin: 0.3 mg/dL (ref 0.3–0.9)
Total Bilirubin: 0.4 mg/dL (ref 0.0–1.2)
Total Protein: 7.4 g/dL (ref 6.5–8.1)

## 2024-04-05 LAB — BASIC METABOLIC PANEL WITH GFR
Anion gap: 11 (ref 5–15)
BUN: 7 mg/dL (ref 6–20)
CO2: 24 mmol/L (ref 22–32)
Calcium: 9.2 mg/dL (ref 8.9–10.3)
Chloride: 103 mmol/L (ref 98–111)
Creatinine, Ser: 0.82 mg/dL (ref 0.44–1.00)
GFR, Estimated: >60 mL/min (ref >60)
Glucose, Bld: 94 mg/dL (ref 70–99)
Potassium: 3.6 mmol/L (ref 3.5–5.1)
Sodium: 139 mmol/L (ref 135–145)

## 2024-04-05 LAB — URINALYSIS, MICROSCOPIC (REFLEX)

## 2024-04-05 LAB — LIPASE, BLOOD: Lipase: 22 U/L (ref 11–51)

## 2024-04-05 MED ORDER — DIAZEPAM 5 MG PO TABS
5.0000 mg | ORAL_TABLET | Freq: Once | ORAL | Status: AC
  Administered 2024-04-05: 5 mg via ORAL
  Filled 2024-04-05: qty 1

## 2024-04-05 MED ORDER — CYCLOBENZAPRINE HCL 10 MG PO TABS: 10.0000 mg | ORAL_TABLET | Freq: Three times a day (TID) | ORAL | 0 refills | Status: AC

## 2024-04-05 MED ORDER — ONDANSETRON HCL 4 MG/2ML IJ SOLN
4.0000 mg | Freq: Once | INTRAMUSCULAR | Status: AC
  Administered 2024-04-05: 4 mg via INTRAVENOUS
  Filled 2024-04-05: qty 2

## 2024-04-05 MED ORDER — KETOROLAC TROMETHAMINE 15 MG/ML IJ SOLN
15.0000 mg | Freq: Once | INTRAMUSCULAR | Status: AC
  Administered 2024-04-05: 15 mg via INTRAVENOUS
  Filled 2024-04-05: qty 1

## 2024-04-05 MED ORDER — MORPHINE SULFATE (PF) 4 MG/ML IV SOLN
4.0000 mg | Freq: Once | INTRAVENOUS | Status: AC
Start: 2024-04-05 — End: 2024-04-05
  Administered 2024-04-05: 4 mg via INTRAVENOUS
  Filled 2024-04-05: qty 1

## 2024-04-05 MED ORDER — IOHEXOL 350 MG/ML SOLN
100.0000 mL | Freq: Once | INTRAVENOUS | Status: AC | PRN
  Administered 2024-04-05: 80 mL via INTRAVENOUS

## 2024-04-05 MED ORDER — KETOROLAC TROMETHAMINE 15 MG/ML IJ SOLN: 15.0000 mg | Freq: Once | INTRAMUSCULAR | Status: DC

## 2024-04-05 NOTE — ED Provider Notes (Signed)
 [facility] Provider Note   CSN: [id number] Arrival date & time: 04/05/24  1130     Patient presents with: Chest Pain   Jenna Mcclure is a 43 y.o. female.   43 year old female with a past medical history of hypertension, heart murmur, presents to the ED with a chief complaint of right upper quadrant pain, epigastric pain that has been ongoing for the past couple days.  Previously evaluated urgent care, told that it was likely her gallbladder versus a virus.  She now tells me that yesterday she began to have some left upper quadrant pain while at work, this felt consistent and sharp worsening without without improvement.  Symptoms are exacerbated with any type of deep inspiration.  She tells me that she has been try peppermint tea to help with some gas along with stool softeners with some improvement in her symptoms.  She did have a normal bowel movement this morning.  She reports she was diagnosed with reflux at that visit was given Protonix  and famotidine  which she states have not helped at all.  She is not on any birth control, no recent travel, no prior history of PE, no other complaints reported.  The history is provided by the patient.  Chest Pain Pain quality: aching   Pain radiates to:  Does not radiate Pain severity:  Moderate Onset quality:  Gradual Duration:  4 days Timing:  Constant Associated symptoms: no abdominal pain, no back pain, no fever, no nausea, no shortness of breath and no vomiting        Prior to Admission medications   Medication Sig Start Date End Date Taking? Authorizing Provider  cyclobenzaprine  (FLEXERIL ) 10 MG tablet Take 1 tablet (10 mg total) by mouth 3 (three) times daily for 7 days. 04/05/24 04/12/24 Yes Stiles Maxcy, PA-C  Cholecalciferol (VITAMIN D ) 2000 UNITS CAPS Take 2,000 Units by mouth daily. Patient not taking: Reported on 01/26/2024    [provider]  clonazePAM  (KLONOPIN ) 0.5 MG tablet  Take 0.5 mg by mouth daily as needed for anxiety.    [provider]  ergocalciferol  (VITAMIN D2) 1.25 MG (50000 UT) capsule Take 50,000 Units by mouth once a week.    [provider]  famotidine  (PEPCID ) 40 MG tablet Take 1 tablet (40 mg total) by mouth daily. 04/03/24   Ruthell Lonni FALCON, PA-C  ferrous sulfate  325 (65 FE) MG EC tablet Take 325 mg by mouth daily. Patient not taking: Reported on 01/26/2024    [provider]  hydrOXYzine  (ATARAX ) 25 MG tablet Take 1 tablet (25 mg total) by mouth every 6 (six) hours as needed for anxiety. Patient not taking: Reported on 01/26/2024 06/13/23   Haze Lonni PARAS, MD  ibuprofen  (ADVIL ) 200 MG tablet Take 4 tablets (800 mg total) by mouth every 8 (eight) hours as needed. 05/14/22   Johnnye Ade, MD  Multiple Vitamin (MULTIVITAMIN WITH MINERALS) TABS tablet Take 1 tablet by mouth daily. Patient not taking: Reported on 01/26/2024    [provider]  oxyCODONE -acetaminophen  (PERCOCET/ROXICET) 5-325 MG tablet Take 1 tablet by mouth every 6 (six) hours as needed for severe pain. Patient not taking: Reported on 01/26/2024 02/02/22   Randol Simmonds, MD  pantoprazole  (PROTONIX ) 20 MG tablet Take 1 tablet (20 mg total) by mouth daily. 04/03/24   Ruthell Lonni FALCON, PA-C  PROGESTERONE PO Take by mouth as needed. Patient not taking: Reported on 01/26/2024    [provider]  Jaquita Kale  Triphasic (ORTHO TRI-CYCLEN LO PO) Take 1 tablet by mouth daily.  08/20/11  [provider]    Allergies: Miconazole nitrate    Review of Systems  Constitutional:  Negative for chills and fever.  HENT:  Negative for sore throat.   Respiratory:  Negative for shortness of breath.   Cardiovascular:  Positive for chest pain. Negative for leg swelling.  Gastrointestinal:  Negative for abdominal pain, nausea and vomiting.  Genitourinary:  Negative for flank pain.  Musculoskeletal:  Negative for back pain.  All other  systems reviewed and are negative.   Updated Vital Signs BP (!) 167/93   Pulse 70   Temp 97.6 F (36.4 C)   Resp 17   Ht 5' 2 (1.575 m)   Wt 84.7 kg   LMP 05/08/2022 (Approximate)   SpO2 97%   BMI 34.15 kg/m   Physical Exam Vitals and nursing note reviewed.  Constitutional:      General: She is not in acute distress.    Appearance: She is well-developed.  HENT:     Head: Normocephalic and atraumatic.     Mouth/Throat:     Pharynx: No oropharyngeal exudate.  Eyes:     Pupils: Pupils are equal, round, and reactive to light.  Cardiovascular:     Rate and Rhythm: Regular rhythm.     Heart sounds: Normal heart sounds.  Pulmonary:     Effort: Pulmonary effort is normal. No respiratory distress.     Breath sounds: Normal breath sounds.  Abdominal:     General: Bowel sounds are normal. There is no distension.     Palpations: Abdomen is soft.     Tenderness: There is no abdominal tenderness.  Musculoskeletal:        General: No tenderness or deformity.     Cervical back: Normal range of motion.     Right lower leg: No edema.     Left lower leg: No edema.  Skin:    General: Skin is warm and dry.  Neurological:     Mental Status: She is alert and oriented to person, place, and time.     (all labs ordered are listed, but only abnormal results are displayed) Labs Reviewed  URINALYSIS, ROUTINE W REFLEX MICROSCOPIC - Abnormal; Notable for the following components:      Result Value   Hgb urine dipstick TRACE (*)    All other components within normal limits  URINALYSIS, MICROSCOPIC (REFLEX) - Abnormal; Notable for the following components:   Bacteria, UA RARE (*)    All other components within normal limits  BASIC METABOLIC PANEL WITH GFR  CBC  HEPATIC FUNCTION PANEL  LIPASE, BLOOD  TROPONIN T, HIGH SENSITIVITY    EKG: EKG Interpretation Date/Time:  Monday April 05 2024 11:47:14 EST Ventricular Rate:  79 PR Interval:  160 QRS Duration:  101 QT  Interval:  366 QTC Calculation: 420 R Axis:   131  Text Interpretation: Right and left arm electrode reversal, interpretation assumes no reversal Sinus rhythm Consider right atrial enlargement Probable lateral infarct, age indeterminate Confirmed by Darra Chew [phone]) on 04/05/2024 12:51:54 PM  Radiology: CT Angio Chest PE W and/or Wo Contrast Result Date: 04/05/2024 EXAM: CTA of the Chest with contrast for PE 04/05/2024 01:45:43 PM TECHNIQUE: CTA of the chest was performed without and with the administration of 80 mL of iohexol  (OMNIPAQUE ) 350 MG/ML injection. Multiplanar reformatted images are provided for review. MIP images are provided for review. Automated exposure control, iterative reconstruction, and/or weight based  adjustment of the mA/kV was utilized to reduce the radiation dose to as low as reasonably achievable. COMPARISON: Comparison 12/20/2022 and chest radiograph 04/02/2024. CLINICAL HISTORY: Pulmonary embolism (PE) suspected, high prob. Low sided chest pain, and shortness of breath. FINDINGS: PULMONARY ARTERIES: Pulmonary arteries are adequately opacified for evaluation. Skip embolus. Main pulmonary artery is normal in caliber. MEDIASTINUM: Mild cardiomegaly. There is no acute abnormality of the thoracic aorta. LYMPH NODES: No mediastinal, hilar or axillary lymphadenopathy. LUNGS AND PLEURA: Calcified 2 x 3 mm right upper lobe pulmonary nodule posteriorly on image 39 series 303, compatible with benign granuloma, no follow-up recommended . Trace bilateral pleural effusions. No pneumothorax. UPPER ABDOMEN: Limited images of the upper abdomen are unremarkable. 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 No acute soft tissue abnormality. IMPRESSION: 1. No evidence of pulmonary embolism. 2. Trace bilateral pleural effusions. 3. Mild cardiomegaly. Electronically signed by: Ryan Salvage MD 04/05/2024 02:51 PM EST RP  Workstation: [id number]     Procedures   Medications Ordered in the ED  morphine  (PF) 4 MG/ML injection 4 mg (4 mg Intravenous Given 04/05/24 1319)  ondansetron  (ZOFRAN ) injection 4 mg (4 mg Intravenous Given 04/05/24 1320)  iohexol  (OMNIPAQUE ) 350 MG/ML injection 100 mL (80 mLs Intravenous Contrast Given 04/05/24 1337)  diazepam (VALIUM) tablet 5 mg (5 mg Oral Given 04/05/24 1544)  ketorolac  (TORADOL ) 15 MG/ML injection 15 mg (15 mg Intravenous Given 04/05/24 1543)    Clinical Course as of 04/05/24 1558  Mon Apr 05, 2024  1514 Bacteria, UA(!): RARE [JS]  1515 Hgb urine dipstick(!): TRACE [JS]    Clinical Course User Index [JS] Vernisha Bacote, PA-C                                 Medical Decision Making Amount and/or Complexity of Data Reviewed Labs: ordered. Decision-making details documented in ED Course. Radiology: ordered.  Risk Prescription drug management.    This patient presents to the ED for concern of chest pain, this involves a number of treatment options, and is a complaint that carries with it a high risk of complications and morbidity.  The differential diagnosis includes ACS, PE, pancreatitis versus renal colic.    Co morbidities: Discussed in HPI   Brief History:  SEE HPI.   EMR reviewed including pt PMHx, past surgical history and past visits to ER.   See HPI for more details   Lab Tests:  I ordered and independently interpreted labs.  The pertinent results include:    I personally reviewed all laboratory work and imaging. Metabolic panel without any acute abnormality specifically kidney function within normal limits and no significant electrolyte abnormalities. CBC without leukocytosis or significant anemia. Lipase is within normal limits, Lfts are normal. UA with mild hemoglobin and rare bacteria with no urinary symptoms.    Imaging Studies:  CT Angio chest pain: IMPRESSION:  1. No evidence of pulmonary embolism.  2. Trace bilateral pleural  effusions.  3. Mild cardiomegaly.   Cardiac Monitoring:  The patient was maintained on a cardiac monitor.  I personally viewed and interpreted the cardiac monitored which showed an underlying rhythm of: NSR 75 EKG non-ischemic   Medicines ordered:  I ordered medication including valium, toradol   for symptomatic event Reevaluation of the patient after these medicines showed that the patient improved I have reviewed the patients home medicines and have made adjustments as needed  Reevaluation:  After the interventions noted above I re-evaluated patient and found that they have :improved  Social Determinants of Health:  The patient's social determinants of health were a factor in the care of this patient   Problem List / ED Course:  Patient presented to the ED with a chief complaint of left upper quadrant pain, previously evaluated for right upper quadrant pain along with epigastric pain.  Diagnosed with likely gallbladder versus reflux, started on Protonix  along with famotidine  without any improvement in symptoms.  Reports that yesterday while she was at work she began to have sudden stabbing pain to the left upper quadrant, she does report the symptoms do worsen whenever she tries to take a deep inspiration.  She has been try peppermint tea oil with some gas and stool softener which has improved her symptoms.  She did have a normal bowel movement this morning.  On evaluation she there is no pain reports of occasional in the left upper quadrant, she does have no pain reported with palpation.  Does have clear lung sounds, although some suspicion for occult pneumonia. Patient of her blood work may be revealed receiving a leukocytosis, hemoglobin is within normal limits.  Bmp is within normal limits, no electrolyte derangement, hepatic function is within normal limits.  Lipase level is normal.  UA without nitrates or leukocytes to be concern for infection.  She does tell me that she has had  previous episodes of painless hematuria, I did discuss with her about following up with urology.  She has a prior history of a hysterectomy.  She denies any lower abdominal pain. CT angio was obtained that did not show any pulmonary embolism, I did discuss with her likely being MSK component, will treat with muscle relaxers along with supportive treatment.  I do not suspect any pyelonephritis at this time versus renal colic based on the urine findings.  She is agreeable to plan and treatment, return precautions discussed at length.  Patient hemodynamically stable for discharge.  Dispostion:  After consideration of the diagnostic results and the patients response to treatment, I feel that the patent would benefit from treatment with muscle relaxers, close follow-up with PCP versus urology.   Portions of this note were generated with Scientist, clinical (histocompatibility and immunogenetics). Dictation errors may occur despite best attempts at proofreading.  Final diagnoses:  Chest pain, unspecified type    ED Discharge Orders          Ordered    cyclobenzaprine  (FLEXERIL ) 10 MG tablet  3 times daily        04/05/24 1540               Marvel Mcphillips, PA-C 04/05/24 1558    Long, Joshua G, MD 04/06/24 [phone]

## 2024-04-05 NOTE — ED Triage Notes (Signed)
 Pt c/o L chest pain, shob and pain with inspiration x 1.5 weeks.  Seen last week for same, started on pantoprazole  and famotidine ; no relief.

## 2024-04-05 NOTE — ED Notes (Signed)
 ED Provider at bedside.

## 2024-04-05 NOTE — ED Notes (Signed)
 Pt declined chest xray at this time, states she had one last week.

## 2024-04-05 NOTE — Discharge Instructions (Addendum)
 You were given interpretation muscle relaxers, please take 1 tablet up to 3 times a day to help with your symptoms.  You can follow-up with urology for the blood in your urine.  If you experience any worsening symptoms please return to the emergency department.

## 2024-04-05 NOTE — ED Notes (Signed)
 Discharge instructions reviewed with patient. Patient verbalizes understanding, no further questions at this time. Medications/prescriptions and follow up information provided. No acute distress noted at time of departure.

## 2024-04-13 DIAGNOSIS — N3281 Overactive bladder: Secondary | ICD-10-CM | POA: Diagnosis not present

## 2024-04-13 DIAGNOSIS — R351 Nocturia: Secondary | ICD-10-CM | POA: Diagnosis not present

## 2024-04-13 DIAGNOSIS — R3121 Asymptomatic microscopic hematuria: Secondary | ICD-10-CM | POA: Diagnosis not present

## 2024-04-13 DIAGNOSIS — R350 Frequency of micturition: Secondary | ICD-10-CM | POA: Diagnosis not present

## 2024-04-27 DIAGNOSIS — R3121 Asymptomatic microscopic hematuria: Secondary | ICD-10-CM | POA: Diagnosis not present

## 2024-04-27 DIAGNOSIS — R319 Hematuria, unspecified: Secondary | ICD-10-CM | POA: Diagnosis not present

## 2024-05-04 DIAGNOSIS — R350 Frequency of micturition: Secondary | ICD-10-CM | POA: Diagnosis not present

## 2024-05-04 DIAGNOSIS — R3121 Asymptomatic microscopic hematuria: Secondary | ICD-10-CM | POA: Diagnosis not present
